# Patient Record
Sex: Male | Born: 1960 | Race: White | Hispanic: No | Marital: Married | Smoking: Former smoker
Health system: Southern US, Community
[De-identification: ages and names within clinical notes are randomized; demographics above are authoritative.]

## PROBLEM LIST (undated history)

## (undated) DIAGNOSIS — R55 Syncope and collapse: Secondary | ICD-10-CM

## (undated) DIAGNOSIS — I4892 Unspecified atrial flutter: Secondary | ICD-10-CM

## (undated) DIAGNOSIS — E785 Hyperlipidemia, unspecified: Secondary | ICD-10-CM

## (undated) DIAGNOSIS — I358 Other nonrheumatic aortic valve disorders: Secondary | ICD-10-CM

## (undated) DIAGNOSIS — G510 Bell's palsy: Secondary | ICD-10-CM

## (undated) DIAGNOSIS — E119 Type 2 diabetes mellitus without complications: Secondary | ICD-10-CM

## (undated) DIAGNOSIS — I471 Supraventricular tachycardia, unspecified: Secondary | ICD-10-CM

## (undated) DIAGNOSIS — I480 Paroxysmal atrial fibrillation: Secondary | ICD-10-CM

## (undated) DIAGNOSIS — I10 Essential (primary) hypertension: Secondary | ICD-10-CM

## (undated) DIAGNOSIS — I2510 Atherosclerotic heart disease of native coronary artery without angina pectoris: Secondary | ICD-10-CM

## (undated) HISTORY — DX: Hyperlipidemia, unspecified: E78.5

## (undated) HISTORY — DX: Other nonrheumatic aortic valve disorders: I35.8

## (undated) HISTORY — PX: HERNIA REPAIR: SHX51

## (undated) HISTORY — DX: Paroxysmal atrial fibrillation: I48.0

## (undated) HISTORY — DX: Supraventricular tachycardia, unspecified: I47.10

## (undated) HISTORY — DX: Essential (primary) hypertension: I10

## (undated) HISTORY — DX: Type 2 diabetes mellitus without complications: E11.9

## (undated) HISTORY — DX: Bell's palsy: G51.0

---

## 2009-12-13 ENCOUNTER — Ambulatory Visit: Payer: Self-pay | Admitting: General Practice

## 2012-08-26 ENCOUNTER — Ambulatory Visit: Payer: Self-pay | Admitting: Anesthesiology

## 2012-08-27 ENCOUNTER — Ambulatory Visit: Payer: Self-pay

## 2013-08-29 ENCOUNTER — Ambulatory Visit: Payer: Self-pay | Admitting: Gastroenterology

## 2013-09-01 LAB — PATHOLOGY REPORT

## 2014-11-23 ENCOUNTER — Inpatient Hospital Stay: Payer: Self-pay | Admitting: Internal Medicine

## 2014-12-09 DIAGNOSIS — G510 Bell's palsy: Secondary | ICD-10-CM | POA: Insufficient documentation

## 2015-01-10 NOTE — Discharge Summary (Signed)
PATIENT NAME:  Dustin Fletcher, Jamil C MR#:  [id number] DATE OF BIRTH:  Aug 29, 1961  DATE OF ADMISSION:  11/23/2014 DATE OF DISCHARGE:  11/24/2014  DISCHARGING PHYSICIAN:  Enid Baasadhika Lucus Lambertson, MD  CONSULTATIONS IN THE HOSPITAL:  Neurology consultation by Dr. Mellody DrownMatthew Smith.    [facility]   DISCHARGE DIAGNOSES:  1. Bell's palsy with right facial deficit.  2. Hypertension.  3. Diabetes.  4. Hyperlipidemia.  5. Obesity.   DISCHARGE MEDICATIONS.  1. Metformin 1,000 milligrams p.o. b.i.d.  2. Vitamin D3, 2000 units international units 2 capsules p.o. b.i.d.  3. [name] 10 mg p.o. daily.  4. Fish oil 1 gm capsule p.o. in the evening.  5. Fish oil 2 gm capsule p.o. in the morning.  6. Lisinopril 10 mg p.o. daily.  7. Meloxicam 15 mg p.o. at bedtime.  8. Topical metronidazole cream 1% for his once a day in the evening.  9. Aleve 220 mg 1 tablet q. 8 . p.r.n. for pain.  10. Lovastatin 20 mg p.o. daily.  11. Prednisone 60 mg for 4 more days and then 40 mg for 2 days and then 20 mg for 2 more days and stop. 12. Aspirin 81 mg p.o. daily.  13. Valacyclovir 1 gm q. 8 hours for 7 days.   DISCHARGE DIET: Low sodium carbohydrate  controlled diet.   DISCHARGE ACTIVITY: As tolerated.   FOLLOWUP INSTRUCTIONS:  PCP follow-up in one week.   LABORATORY DATA AND IMAGING STUDIES PRIOR TO DISCHARGE:  1. WBC 6.0, hemoglobin 13.4, hematocrit 41.9, platelet count 178,000.  2. Sodium 138, potassium 3.7, chloride 104, bicarbonate 26, BUN 17, creatinine 0.92, glucose 131, and calcium of 8.8.  HbA1c slightly elevated at 6.6.  3. LDL cholesterol 29, HDL 28, triglycerides 320 cholesterol of 121.  4. Ultrasound Dopplers carotids showing no evidence of any hemodynamically significant stenosis.   Echo Doppler done showing EF is 60-65%. No other abnormality noted. MRI of the brain done without contrast showing unremarkable MRI, no acute or focal intracranial abnormalities noted.    5. Urinalysis negative for any infection.   BRIEF HOSPITAL COURSE: Dustin Fletcher is a very pleasant 54 year old obese Caucasian male with past medical history significant for hypertension, diabetes, who presents to the hospital secondary to left facial numbness, right facial droop, and left arm numbness.   1. Neurological deficits completely resolved, left facial numbness and left arm numbness, not sure what the cause is exactly but the right facial droop looks like lower motor neuron kind of 7th nerve palsy which goes with Bell's palsy.  He was started  on prednisone, and valacyclovir.  His MRI is negative for any acute infarct. His carotid Dopplers are normal and his echocardiogram is normal as well.   He will be  seen by a neurology and if everything is good, he will be discharged home. He had been under a lot of stress lately, but denies any viral infection.  2. Hypertension.  He is on lisinopril for control of blood pressure.  3. Diabetes.   Continue his home medications.  4. Hyperlipidemia.   His Crestor dose has been increased because LDL was high.  He is already on fish oil for his triglycerides.  Recommended outpatient follow-up.   His course has been otherwise uneventful in the hospital.   DISCHARGE CONDITION:  Stable. He was evaluated with physical therapy, speech therapy, and he did fine. Marland Kitchen.  DISCHARGE DISPOSITION:   Home.   TIME SPENT ON DISCHARGE: Forty minutes.  cc a copy of this to [facility]    ____________________________ Enid Baas, MD rk:tr D: 11/24/2014 15:33:46 ET T: 11/24/2014 16:58:25 ET JOB#: [id number]  cc: Enid Baas, MD, <Dictator> [facility] Enid Baas MD ELECTRONICALLY SIGNED 11/26/2014 18:33

## 2015-01-10 NOTE — H&P (Signed)
PATIENT NAME:  Dustin Fletcher, Dustin Fletcher MR#:  [id number] DATE OF BIRTH:  03-Aug-1961  DATE OF ADMISSION:  11/23/2014  PRIMARY CARE PHYSICIAN:  Dr. Dossie Arbour at [facility].   REFERRING EMERGENCY ROOM PHYSICIAN:  Dr. Toney Rakes.    CHIEF COMPLAINT: Slurred speech, right-sided facial droop, and left arm numbness.   HISTORY OF PRESENT ILLNESS: This very pleasant 54 year old man with past medical history of hypertension and diabetes presents with slurred speech, right-sided facial droop, left-sided facial numbness, and left arm numbness which started after waking up this morning. He reports that he initially noticed the difference in his face when he was brushing his teeth and then again when he was trying to eat breakfast. He then went to work and noted that his arm was feeling very numb and tingly while working, not weak, but the arm also looked slightly puffy and red. He did go to the wellness center at his office, had his blood pressure check, it was 138/77. 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 He was sent to the Emergency Room due to facial asymmetry.   PAST MEDICAL HISTORY:  1. Hypertension.  2. Diabetes mellitus.  3. Obesity.   SOCIAL HISTORY: The patient lives with his wife. He does not smoke cigarettes or drink alcohol. No illicit substance use. He is very active at work, very mobile, no assistive devices.   FAMILY MEDICAL HISTORY: Positive for coronary artery disease, hypertension, congestive heart failure, atrial fibrillation in his father and brother. No history of stroke.   REVIEW OF SYSTEMS:  CONSTITUTIONAL: Negative for fatigue or fever. No change in weight.  HEENT: No change in hearing or vision. He does have some pain in the right eye with tearing.  No pain in the ears. No sore throat or difficulty swallowing.  RESPIRATORY: No cough, wheezing, shortness of  breath. No history of COPD. CARDIOVASCULAR: No chest pain edema, arrhythmias, or syncope.  GASTROINTESTINAL: No nausea, vomiting, diarrhea, abdominal pain, or change in bowel habits.  GENITOURINARY: No dysuria or frequency.  ENDOCRINE: No hot or cold intolerance. No polyuria or polydipsia.  HEMATOLOGIC: No easy bruising or bleeding.  SKIN: No new rashes or lesions.  MUSCULOSKELETAL: No new pain in the neck, back, knees, shoulders, or hips. He has not had any weakness. No gout.  NEUROLOGIC: Positive for focal numbness and weakness as above, some dysarthria, probable CVA today.  No history of CVA in the past. No seizures, dementia, memory loss, or confusion.  PSYCHIATRIC: No bipolar disorder or schizophrenia.   ALLERGIES: No known allergies.   MEDICATIONS:  1. Vitamin D3, 2000 international units 2 capsules twice a day.  2. Metronidazole topical 1% cream, apply to affected area once a day in the evening.  3. Metformin 500 mg 2 tablets twice a day.  4. Meloxicam 15 mg 1 tablet once a day at bedtime.  5. Lisinopril 10 mg 1 tablet once a day.  6. Fish oil 1000 mg 2 capsules once a day in morning and 1 in the evening.  7. [name] 10 mg oral tablet once a day in the morning.  8. Crestor 5 mg 1 tablet once a day.  9. Aleve 220 mg 2-3 tablets every 8 hours as needed for pain.     PHYSICAL EXAMINATION:  VITAL SIGNS: Temperature 98.3, pulse 78, respirations 18, blood pressure 155/99, oxygenation 97% on room air.  GENERAL: In  no acute distress.  HEENT: Pupils are equal, round, and reactive to light. Extraocular motion is intact. Conjunctivae are clear. He does have less blinking on the right side and his right eye is lacrimating. Oral mucous membranes are pink and moist. Good dentition. Posterior oropharynx is difficult to visualize, but is clear of exudate, edema, or erythema. No cervical lymphadenopathy noted. Trachea midline. Unable to palpate the thyroid.  RESPIRATORY: Lungs clear to auscultation  bilaterally with good air movement.  CARDIOVASCULAR: Distant, regular. No murmurs, rubs, or gallops. No peripheral edema. Peripheral pulses 2 +.  ABDOMEN: Obese, soft, nontender, nondistended. No guarding, no rebound. Bowel sounds are normal. No hepatosplenomegaly or mass noted.  MUSCULOSKELETAL: No swollen tender joints, range of motion normal, strength 5 out of 5 throughout in all extremities.  SKIN: No new rashes. No open lesions or wounds.  NEUROLOGIC: He has a right-sided facial droop with sensation of left-sided numbness over the jaw.  Tongue deviates to the left. Strength is 5 out of 5 in all extremities. Sensation over the extremities is intact to soft touch.  PSYCHIATRIC: He is alert and oriented and has good insight into his clinical condition. No signs of uncontrolled depression or anxiety.   LABORATORY DATA: Sodium 140, potassium 4.0, chloride 106, bicarbonate 25, BUN 17, creatinine 0.95, glucose 113. Calcium is 9.3, total protein and LFTs are normal. Troponin is less than 0.03. White blood cells 6.9, hemoglobin 14.4, platelets 180,000. MCV is 85. INR is 0.9.  IMAGING: CT of the head without contrast shows no evidence of acute intracranial abnormality.   Chest x-ray shows no active cardiopulmonary disease.   ASSESSMENT AND PLAN:  1.  Probable cerebrovascular accident: CT is negative, an MRI is pending. He has a persistent right-sided facial droop with left jaw numbness and left arm numbness which is improving. He has received 325 mg of aspirin and I will continue him on 81 mg of aspirin daily. I will increase the dose of his statin at this time. I have consulted speech and physical therapy. We will check lipids and hemoglobin A1c. He is being admitted to telemetry. We will obtain a 2-D echocardiogram and carotid Dopplers. I have consulted neurology for further recommendations. He was not on any anticoagulation or antiplatelet agents prior to this admission.  2.  Diabetes mellitus type 2.  We will check a hemoglobin A1c, hold his oral hypoglycemic agents, and start sliding scale insulin during the admission.  3.  Hypertension: We will allow for permissive hypertension in the setting of acute stroke. We will hold his lisinopril for now.  4.  Deep vein thrombosis prophylaxis with heparin.  5.  The patient is a full code.   TIME SPENT ON ADMISSION: 45 minutes.     ____________________________ Ena Dawleyatherine P. Clent RidgesWalsh, MD cpw:bu D: 11/23/2014 17:17:18 ET T: 11/23/2014 17:55:02 ET JOB#: [id number]  cc: Santina Evansatherine P. Clent RidgesWalsh, MD, <Dictator> Steele SizerMark A. [facility], MD Gale JourneyATHERINE P WALSH MD ELECTRONICALLY SIGNED 11/24/2014 15:05

## 2015-01-10 NOTE — Discharge Summary (Signed)
PATIENT NAME:  Dustin Fletcher, Dustin Fletcher MR#:  [id number] DATE OF BIRTH:  December 08, 1960  DATE OF ADMISSION:  11/23/2014 DATE OF DISCHARGE:  11/25/2014  ADMITTING PHYSICIAN: Dustin Chapel P. Volanda Napoleon, Fletcher    DISCHARGING PHYSICIAN: Dustin Lighter, Fletcher   PRIMARY CARE PHYSICIAN: Dustin Maple, Fletcher   [place]:  Fletcher consultation by Dustin Fletcher.   DISCHARGE DIAGNOSES: 1. 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  2. Hypertension.  3. Diabetes.  4. Hyperlipidemia,  5. Nonspecific left-sided symptoms, either MRI-negative stroke versus multiple sclerosis; however, no diagnosis made.   DISCHARGE MEDICATIONS: 1. Metformin 500 mg 2 tablets p.o. b.i.d.  2. Vitamin D3, 2000 international units 2 capsules p.o. b.i.d.  3. [name] 10 mg p.o. daily.  4. Fish oil 1 gram capsule daily.  5. Lisinopril 10 mg p.o. at bedtime.  6. Meloxicam 15 mg p.o. at bedtime.  7. Metronidazole 1% topical cream to affected area once a day in the evening.  8. Aleve 220 mg 2-3 tablets every 8 hours as needed for pain.  9. Fish 1 gram 2 capsules in the morning.  10. Crestor 20 mg p.o. daily.  11. Aspirin 81 mg p.o. daily.  12. Prednisone 60 mg for 5 days, then followed by 10 mg taper every day.  13. Valacyclovir 1 gram 3 times a day for 8 more days.  14. Vitamin B12, 1000 mcg p.o. daily.   DISCHARGE DIET: Low-sodium and ADA 1800-calorie diet.   DISCHARGE ACTIVITY: As tolerated.  FOLLOWUP INSTRUCTIONS: 1. PCP follow-up in 1 week.  2. Follow up with Dustin Fletcher Fletcher in 4 weeks.   LABORATORY DATA AND IMAGING STUDIES: PRIOR TO DISCHARGE: MRI of the brain with and without contrast showing normal appearance of the brain, focal enhancement of 7th nerve on the right side within internal auditory canal, consistent with neuritis. 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 The potential for neurocompression,  but foraminal stenosis is more pronounced on the right side than left. There is no acute soft disk herniation noted. Vitamin B12 levels were low normal at 336 pg per mL. Folic acid within normal limits. TSH within normal limits. ESR and CRP are within normal limits, as well. Ultrasound carotid Dopplers: Bilaterally, showing no evidence of any hemodynamically significant stenosis. WBC 6.0, hemoglobin 13.5, hematocrit 41.9, platelet count 178,000. Sodium 138, potassium 3.7, chloride 104, bicarbonate 26, BUN 17, creatinine 0.92, glucose 131, and calcium of 8.8. LDL cholesterol 29, HDL 28, triglycerides 320 cholesterol 121. Echocardiogram Doppler showing normal LV ejection fraction, EF of 60% to 65%. Urinalysis: Negative for any infection.   BRIEF Fletcher COURSE: For more details, look at the discharge summary dictated by Dustin Fletcher on 11/24/2014. In brief, Dustin Fletcher is a 54 year old obese male with past medical history significant for diabetes, hypertension, admitted to the Fletcher secondary to right facial palsy and left facial and arm numbness.  1. Bell palsy on the right side and MRI of the brain is completely negative for any stroke. He did have right facial nerve neuritis noted on the MRI, so likely it is Bell palsy. He started on valacyclovir and prednisone at this time. He was seen by neurologist, Dustin Fletcher, in the Fletcher 2. He did have nonspecific left-sided facial numbness and also arm numbness, which are not explainable. The MRI did not show any significant stroke findings. If he has recurrence of these symptoms. He needs to come  back to the Emergency Room , and this was explained to the patient, because this could be MRI negative stroke; however, his symptoms are resolved, he will still need to be on aspirin, and he was advised to increase the dose of his statin because of his elevated cholesterol levels. His ultrasound did not show any significant stenosis. He worked with physical therapy, and  he is doing fine and will follow up with Dustin Fletcher as an outpatient. MRI of the cervical spine was done. No evidence of any multiple sclerosis. Plan for lumbar puncture was there; however, discontinued because the patient refused and inflammatory markers were not elevated anyway; however, he does have some cervical spine disk disease and outpatient follow-up recommended.  3. Hypertension. The patient's home medications are being continued.  4. Diabetes. Home medications are being continued.  5. Vitamin B12 levels were low normal, so being replaced, especially with his neurological symptoms.  6. Hyperlipidemia. Statin dose has been increased.  7. His course has been otherwise uneventful in the Fletcher.   DISCHARGE CONDITION: Stable.   DISCHARGE DISPOSITION: Home.   TIME SPENT ON DISCHARGE: 45 minutes.     ____________________________ Dustin Lighter, Fletcher Dustin Fletcher D: 11/25/2014 16:11:20 ET T: 11/25/2014 19:53:06 ET JOB#: [id number]  cc: Dustin Lighter, Fletcher, <Dictator> Dustin Maple, Fletcher Dustin K. Dustin Ghazi, Fletcher Dustin Fletcher ELECTRONICALLY SIGNED 11/26/2014 18:37

## 2015-01-10 NOTE — Consult Note (Signed)
Referring Physician:  Aldean Jewett :   Primary Care Physician:  Aldean Jewett : [facility], [address], [place], [place] [address], [place] [phone]  Reason for Consult: Admit Date: 23-Nov-2014  Chief Complaint: L sided numbness  Reason for Consult: CVA   History of Present Illness: History of Present Illness:   seen at request of Dr. Volanda Napoleon for possible stroke;  54 yo RHD M presents to [facility] secondary to L facial numbness and L arm numbness initially that started 2 days ago when he woke up.  He does not note any new weakness but does states that he gets occasional numbness when he carries his bag at work but that is usually in his L arm.  He did notice some tearing in the R eye also 2 days ago.  Yesterday, he noticed the R facial droop that started.  He denies headache or neck pain.  ROS:  General denies complaints   HEENT no complaints   Lungs no complaints   Cardiac no complaints   GI no complaints   GU no complaints   Musculoskeletal no complaints   Extremities no complaints   Skin no complaints   Neuro numbness/tingling  R facial droop, slurred speech   Endocrine no complaints   Psych no complaints   Past Medical/Surgical Hx:  Hypertension:   Diabetes:   Past Medical/ Surgical Hx:  Past Medical History HTN, DM, HLD   Past Surgical History none   Home Medications: Medication Instructions Last Modified Date/Time  predniSONE 20 mg oral tablet 3 tab(s) orally once a dayx  4 days 2 tabs orally once a day x 2 days and then 1 tab orally once a day x 2 days 15-Mar-16 09:44  rosuvastatin 20 mg oral tablet 1 tab(s) orally once a day 15-Mar-16 09:44  aspirin 81 mg oral tablet, chewable 1 tab(s) orally once a day 15-Mar-16 09:44  valACYclovir 1 g oral tablet 1 tab(s) orally every 8 hours x 7 days 15-Mar-16 09:44  metFORMIN 500 mg oral tablet 2 tab(s) orally 2 times a day 15-Mar-16 07:33  Vitamin D3 2000 intl units oral capsule 2 cap(s)  orally 2 times a day 15-Mar-16 07:33  [name] 10 mg oral tablet 1 tab(s) orally once a day (in the morning) 15-Mar-16 07:33  Fish Oil 1000 mg oral capsule 2 cap(s) orally once a day (in the morning) 15-Mar-16 07:33  Fish Oil 1000 mg oral capsule 1 cap(s) orally once a day (in the evening) 15-Mar-16 07:33  lisinopril 10 mg oral tablet 1 tab(s) orally once a day (at bedtime) 15-Mar-16 07:33  meloxicam 15 mg oral tablet 1 tab(s) orally once a day (at bedtime) 15-Mar-16 07:33  metroNIDAZOLE topical 1% topical cream Apply topically to affected area once a day (in the evening) 15-Mar-16 07:33  Aleve sodium 220 mg oral tablet 2-3 tab(s) orally every 8 hours, As Needed - for Pain 15-Mar-16 07:33   Allergies:  No Known Allergies:   Allergies:  Allergies NKDA   Social/Family History: Employment Status: currently employed  Lives With: spouse  Living Arrangements: house  Social History: + tob, no EtOH, no illicits  Family History: no seizures, no stroke   Vital Signs: **Vital Signs.:   15-Mar-16 11:38  Vital Signs Type Routine  Temperature Temperature (F) 97.8  Celsius 36.5  Temperature Source oral  Pulse Pulse 76  Systolic BP Systolic BP 553  Diastolic BP (mmHg) Diastolic BP (mmHg) 83  Mean BP 99  Pulse Ox % Pulse  Ox % 97  Pulse Ox Activity Level  At rest  Oxygen Delivery Room Air/ 21 %   Physical Exam: General: overweight, NAD  HEENT: normocephalic, sclera nonicteric, oropharynx clear, rhinophymia  Neck: supple, no JVD, no bruits  Chest: CTA B, no wheezing  Cardiac: RRR, no murmurs, no edema, 2+ pulses  Extremities: no C/C/E, FROM   Neurologic Exam: Mental Status: alert and oriented x 3, normal speech and language, follows complex commands  Cranial Nerves: PERRLA, EOMI, nl VF, R CN VII palsy, tongue midline, shoulder shrug equal  Motor Exam: mild drift in L UE and L LE, 5 /5 R, nl tone  Deep Tendon Reflexes: 3+ on L, 2+ on R, downgoing plantars B, neg Hoffmann  Sensory Exam:  pinprick, temperature, and vibration intact B  Coordination: FTN and HTS WNL   Lab Results: LabObservation:  14-Mar-16 18:55   OBSERVATION Reason for Test  Hepatic:  14-Mar-16 15:36   Bilirubin, Total 0.7 (0.3-1.2 NOTE: New Reference Range  11/03/14)  Alkaline Phosphatase 51 (38-126 NOTE: New Reference Range  11/03/14)  SGPT (ALT) 20 (17-63 NOTE: New Reference Range  11/03/14)  SGOT (AST) 19 (15-41 NOTE: New Reference Range  11/03/14)  Total Protein, Serum 6.9 (6.5-8.1 NOTE: New Reference Range  11/03/14)  Albumin, Serum 4.1 (3.5-5.0 NOTE: New reference range  11/03/14)  Routine Chem:  15-Mar-16 03:01   Cholesterol, Serum 121 (0-200 NOTE: New Reference Range  11/03/14)  Triglycerides, Serum  320 (0-149 NOTE: New Reference Range  11/03/14)  HDL (INHOUSE)  28 (41-135 NOTE: New Reference Range:  11/03/14)  VLDL Cholesterol Calculated  64 (0-40 NOTE: New Reference Range  11/03/14)  LDL Cholesterol Calculated 29 (0-99 NOTE: New Reference Range  11/03/14)  Glucose, Serum  131 (65-99 NOTE: New Reference Range  11/03/14)  BUN 17 (6-20 NOTE: New Reference Range  11/03/14)  Creatinine (comp) 0.92 (0.61-1.24 NOTE: New Reference Range  11/03/14)  Sodium, Serum 138 (135-145 NOTE: New Reference Range  11/03/14)  Potassium, Serum 3.7 (3.5-5.1 NOTE: New Reference Range  11/03/14)  Chloride, Serum 104 (101-111 NOTE: New Reference Range  11/03/14)  CO2, Serum 26 (22-32 NOTE: New Reference Range  11/03/14)  Calcium (Total), Serum  8.8 (8.9-10.3 NOTE: New Reference Range  11/03/14)  Anion Gap 8  eGFR (African American) >60  eGFR (Non-African American) >60 (eGFR values <26m/min/1.73 m2 may be an indication of chronic kidney disease (CKD). Calculated eGFR is useful in patients with stable renal function. The eGFR calculation will not be reliable in acutely ill patients when serum creatinine is changing rapidly. It is not useful in patients on dialysis. The eGFR  calculation may not be applicable to patients at the low and high extremes of body sizes, pregnant women, and vegetarians.)  Hemoglobin A1c ([facility])  6.6 (4.0-6.0 NOTE: New Reference Range  11/03/14)  Cardiac:  14-Mar-16 15:36   Troponin I <0.03 (0.00-0.03 0.03 ng/mL or less: NEGATIVE  Repeat testing in 3-6 hrs  if clinically indicated. >0.03 ng/mL: POTENTIAL  MYOCARDIAL INJURY. Repeat  testing in 3-6 hrs if  clinically indicated. NOTE: An increase or decrease  of 30% or more on serial  testing suggests a  clinically important change NOTE: New Reference Range  11/03/14)  Routine UA:  14-Mar-16 15:36   Color (UA) Yellow  Clarity (UA) Clear  Glucose (UA) >=500  Bilirubin (UA) Negative  Ketones (UA) Negative  Specific Gravity (UA) 1.016  Blood (UA) Negative  pH (UA) 5.0  Protein (UA) Negative  Nitrite (UA) Negative  Leukocyte Esterase (UA) Negative (Result(s) reported on 23 Nov 2014 at 06:24PM.)  RBC (UA) NONE SEEN  WBC (UA) <1 /HPF  Bacteria (UA) NONE SEEN  Epithelial Cells (UA) NONE SEEN  Mucous (UA) PRESENT (Result(s) reported on 23 Nov 2014 at [facility].)  Routine Coag:  14-Mar-16 15:36   Activated PTT (APTT) 26.3 (A HCT value >55% may artifactually increase the APTT. In one study, the increase was an average of 19%. Reference: "Effect on Routine and Special Coagulation Testing Values of Citrate Anticoagulant Adjustment in Patients with High HCT Values." American Journal of Clinical Pathology 2376;283:151-761.)  Prothrombin 12.4 (11.4-15.0 NOTE: New Reference Range  10/09/14)  INR 0.9 (INR reference interval applies to patients on anticoagulant therapy. A single INR therapeutic range for coumarins is not optimal for all indications; however, the suggested range for most indications is 2.0 - 3.0. Exceptions to the INR Reference Range may include: Prosthetic heart valves, acute myocardial infarction, prevention of myocardial infarction, and combinations of aspirin  and anticoagulant. The need for a higher or lower target INR must be assessed individually. Reference: The Pharmacology and Management of the Vitamin K  antagonists: the seventh ACCP Conference on Antithrombotic and Thrombolytic Therapy. YWVPX.1062 Sept:126 (3suppl): [id number]. A HCT value >55% may artifactually increase the PT.  In one study,  the increase was an average of 25%. Reference:  "Effect on Routine and Special Coagulation Testing Values of Citrate Anticoagulant Adjustment in Patients with High HCT Values." American Journal of Clinical Pathology 2006;126:400-405.)  Routine Hem:  15-Mar-16 03:01   WBC (CBC) 6.0  RBC (CBC) 4.90  Hemoglobin (CBC) 13.5  Hematocrit (CBC) 41.9  Platelet Count (CBC) 178  MCV 86  MCH 27.5  MCHC 32.1  RDW  14.7  Neutrophil % 54.6  Lymphocyte % 31.9  Monocyte % 10.8  Eosinophil % 2.3  Basophil % 0.4  Neutrophil # 3.3  Lymphocyte # 1.9  Monocyte # 0.7  Eosinophil # 0.1  Basophil # 0.0 (Result(s) reported on 24 Nov 2014 at 04:07AM.)   Radiology Results: [place]:    15-Mar-16 10:58, US Carotid Doppler Bilateral  US Carotid Doppler Bilateral   REASON FOR EXAM:    cva  COMMENTS:       PROCEDURE: [place]  - US CAROTID DOPPLER BILATERAL  - Nov 24 2014 10:58AM     CLINICAL DATA:  54 year old male with transient ischemic attack  symptoms    EXAM:  BILATERAL CAROTID DUPLEX ULTRASOUND    TECHNIQUE:  Pearline Cables scale imaging, color Doppler and duplex ultrasound were  performed of bilateral carotid and vertebral arteries in the neck.  COMPARISON:  Brain MRI 11/23/2014    FINDINGS:  Criteria: Quantification of carotid stenosis is based on velocity  parameters that correlate the residual internal carotid diameter  with NASCET-based stenosis levels, using the diameter of the distal  internal carotid lumen as the denominator for stenosis measurement.    The following velocity measurements were obtained:    RIGHT    ICA:  86/28 cm/sec    CCA:  694/85  cm/sec  SYSTOLIC ICA/CCA RATIO:  0.8    DIASTOLIC ICA/CCA RATIO:  1.1    ECA:  100 cm/sec    LEFT    ICA:  62/31 cm/sec    CCA:  46/27 cm/sec    SYSTOLIC ICA/CCA RATIO:  0.9    DIASTOLIC ICA/CCA RATIO:  1.6  ECA:  72 cm/sec    RIGHT CAROTID ARTERY: No significant atherosclerotic plaque or  evidence of stenosis.  RIGHT VERTEBRAL ARTERY:  Patent with antegrade flow.    LEFT CAROTID ARTERY: No significant atherosclerotic plaque or  evidence of stenosis.    LEFT VERTEBRAL ARTERY:  Patent with antegrade flow.     IMPRESSION:  Unremarkable bilateral carotid duplex ultrasound.  Vertebral arteries are patent with antegrade flow.    Signed,    Criselda Peaches, MD    [facility]    [facility]      Electronically Signed    By: Jacqulynn Cadet M.D.    On: 11/24/2014 11:08     Verified By: Criselda Peaches, M.D.,  MRI:    14-Mar-16 18:45, MRI Brain Without Contrast  MRI Brain Without Contrast   REASON FOR EXAM:    CVA  COMMENTS:       PROCEDURE: MR  - MR BRAIN WO CONTRAST  - Nov 23 2014  6:45PM     CLINICAL DATA:  Slurred speech began around 12;30 today, with  LEFT-sided facial numbness extending to the LEFT arm. Symptoms have  partially resolved. Initial encounter.    EXAM:  MRI HEAD WITHOUT CONTRAST    TECHNIQUE:  Multiplanar, multiecho pulse sequences of the brain and surrounding  structures were obtained without intravenous contrast.  COMPARISON:  CT head earlier today.    FINDINGS:  No evidence for acute infarction, hemorrhage, mass lesion,  hydrocephalus, or extra-axial fluid. Normal cerebral volume. No  significant white matter disease. Flow voids are maintained  throughout the carotid, basilar, and vertebral arteries. There are  no areas of chronic hemorrhage. Pituitary, pineal, and cerebellar  tonsils unremarkable. No upper cervical lesions. Visualized  calvarium, skull base, and upper  cervical osseous structures  unremarkable. Scalp and extracranial soft tissues, orbits, sinuses,  and mastoids show no acute process. Similar appearance to prior CTA     IMPRESSION:  Unremarkable MRI head. No acute or focal intracranial abnormalities.  Electronically Signed    By: Rolla Flatten M.D.    On: 11/23/2014 18:49         Verified By: Staci Righter, M.D.,  CT:    14-Mar-16 14:57, CT Head Without Contrast  CT Head Without Contrast   REASON FOR EXAM:    possible CVA  COMMENTS:   May transport without cardiac monitor    PROCEDURE: CT  - CT HEAD WITHOUT CONTRAST  - Nov 23 2014  2:57PM     CLINICAL DATA:  Numbness and tingling left arm and hand on the  initial evaluation with some slurred speech since this morning    EXAM:  CT HEAD WITHOUT CONTRAST    TECHNIQUE:  Contiguous axial images were obtained from the base of the skull  through the vertex without intravenous contrast.  COMPARISON:  None.    FINDINGS:  No evidence of abnormal attenuation to suggest mass or vascular  territory infarct. No hemorrhage or extra-axial fluid. Mild diffuse  cortical atrophy. No hydrocephalus. Calvarium intact. Visualized  portions of paranasal sinuses well-aerated.     IMPRESSION:  No evidence of acute intracranial abnormality.      Electronically Signed    By: Skipper Cliche M.D.    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 but would be highly unusual with other symptoms so we need to r/o other etiologies R hemispheric syndrome-  this could be caused by C spine problem or infectious etiology in CSF;  there are abnormal reflexes on the L as well as weakness so this is likely a central problem not seen  on MRI repeat MRI of brain and IAC w/wo contrast MRI of c-spine w/wo contrast check ESR, CRP, B12/folate, TSH and lyme may need LP tomorrow if above neg change Valacyclovir to Acyclovir IV until MRI returns ok to continue steroids will follow closely  Electronic Signatures: Jamison Neighbor (MD)  (Signed 15-Mar-16 18:19)  Authored: REFERRING PHYSICIAN, Primary Care Physician, Consult, History of Present Illness, Review of Systems, PAST MEDICAL/SURGICAL HISTORY, HOME MEDICATIONS, ALLERGIES, Social/Family History, NURSING VITAL SIGNS, Physical Exam-, LAB RESULTS, RADIOLOGY RESULTS, Recommendations   Last Updated: 15-Mar-16 18:19 by Jamison Neighbor (MD)

## 2015-05-19 ENCOUNTER — Telehealth: Payer: Self-pay | Admitting: Family Medicine

## 2015-05-19 NOTE — Telephone Encounter (Signed)
Pt called stated that if he needs labs for his CPE on 06/01/15 please fax order to [facility] @ [phone] Attn: Massie Bougie Day. Thanks.

## 2015-05-20 NOTE — Telephone Encounter (Signed)
Notified patient via VM that we are not able to send a order because Dr.Crissman is out of the office until 05/31/15.

## 2015-05-25 DIAGNOSIS — I10 Essential (primary) hypertension: Secondary | ICD-10-CM | POA: Insufficient documentation

## 2015-05-25 DIAGNOSIS — E118 Type 2 diabetes mellitus with unspecified complications: Secondary | ICD-10-CM | POA: Insufficient documentation

## 2015-05-25 DIAGNOSIS — E785 Hyperlipidemia, unspecified: Secondary | ICD-10-CM | POA: Insufficient documentation

## 2015-05-25 DIAGNOSIS — E119 Type 2 diabetes mellitus without complications: Secondary | ICD-10-CM | POA: Insufficient documentation

## 2015-06-01 ENCOUNTER — Ambulatory Visit (INDEPENDENT_AMBULATORY_CARE_PROVIDER_SITE_OTHER): Payer: BLUE CROSS/BLUE SHIELD | Admitting: Family Medicine

## 2015-06-01 ENCOUNTER — Encounter: Payer: Self-pay | Admitting: Family Medicine

## 2015-06-01 VITALS — BP 124/78 | HR 96 | Temp 97.7°F | Ht 73.7 in | Wt 288.0 lb

## 2015-06-01 DIAGNOSIS — E119 Type 2 diabetes mellitus without complications: Secondary | ICD-10-CM

## 2015-06-01 DIAGNOSIS — E785 Hyperlipidemia, unspecified: Secondary | ICD-10-CM | POA: Diagnosis not present

## 2015-06-01 DIAGNOSIS — L711 Rhinophyma: Secondary | ICD-10-CM | POA: Diagnosis not present

## 2015-06-01 DIAGNOSIS — I10 Essential (primary) hypertension: Secondary | ICD-10-CM

## 2015-06-01 MED ORDER — METRONIDAZOLE 1 % EX GEL: Freq: Every day | CUTANEOUS | Status: DC

## 2015-06-01 MED ORDER — ROSUVASTATIN CALCIUM 5 MG PO TABS: 5.0000 mg | ORAL_TABLET | Freq: Every day | ORAL | Status: DC

## 2015-06-01 MED ORDER — METFORMIN HCL 500 MG PO TABS: 1000.0000 mg | ORAL_TABLET | Freq: Two times a day (BID) | ORAL | Status: DC

## 2015-06-01 MED ORDER — MELOXICAM 15 MG PO TABS: 15.0000 mg | ORAL_TABLET | Freq: Every day | ORAL | Status: DC

## 2015-06-01 MED ORDER — DAPAGLIFLOZIN PROPANEDIOL 10 MG PO TABS: 10.0000 mg | ORAL_TABLET | Freq: Every day | ORAL | Status: DC

## 2015-06-01 MED ORDER — LISINOPRIL 10 MG PO TABS: 10.0000 mg | ORAL_TABLET | Freq: Every day | ORAL | Status: DC

## 2015-06-01 NOTE — Progress Notes (Signed)
BP 124/78 mmHg  Pulse 96  Temp(Src) 97.7 F (36.5 C)  Ht 6' 1.7" (1.872 m)  Wt 288 lb (130.636 kg)  BMI 37.28 kg/m2  SpO2 95%   Subjective:    Patient ID: Dustin Fletcher, male    DOB: 1961/01/06, 54 y.o.   MRN: [id number]  HPI: Dustin Fletcher is a 54 y.o. male  Chief Complaint  Patient presents with  . Annual Exam   about a month ago or so patient had some constipation issues which have since resolved with stool softeners. Patient doing well. Biggest concern is patient having some right eye issues with occasional throbbing but vision is okay. Patient hadn't had his annual checkup. Is gotten notification to make appointment which she will do.  Patient's blood pressures been doing well with some occasional orthostatic hypotensive changes. Blood sugar doing well no hypoglycemic spells  Relevant past medical, surgical, family and social history reviewed and updated as indicated. Interim medical history since our last visit reviewed. Allergies and medications reviewed and updated.  Review of Systems  Constitutional: Negative.   HENT: Negative.   Eyes: Negative.   Respiratory: Negative.   Cardiovascular: Negative.   Gastrointestinal: Negative.   Endocrine: Negative.   Genitourinary: Negative.   Musculoskeletal: Negative.   Skin: Negative.   Allergic/Immunologic: Negative.   Neurological: Negative.   Hematological: Negative.   Psychiatric/Behavioral: Negative.     Per HPI unless specifically indicated above     Objective:    BP 124/78 mmHg  Pulse 96  Temp(Src) 97.7 F (36.5 C)  Ht 6' 1.7" (1.872 m)  Wt 288 lb (130.636 kg)  BMI 37.28 kg/m2  SpO2 95%  Wt Readings from Last 3 Encounters:  06/01/15 288 lb (130.636 kg)  11/30/14 285 lb (129.275 kg)    Physical Exam  Constitutional: He is oriented to person, place, and time. He appears well-developed and well-nourished.  HENT:  Head: Normocephalic and atraumatic.  Right Ear: External ear normal.  Left  Ear: External ear normal.  Eyes: Conjunctivae and EOM are normal. Pupils are equal, round, and reactive to light.  Neck: Normal range of motion. Neck supple.  Cardiovascular: Normal rate, regular rhythm, normal heart sounds and intact distal pulses.   Pulmonary/Chest: Effort normal and breath sounds normal.  Abdominal: Soft. Bowel sounds are normal. There is no splenomegaly or hepatomegaly.  Genitourinary: Rectum normal, prostate normal and penis normal.  Musculoskeletal: Normal range of motion.  Neurological: He is alert and oriented to person, place, and time. He has normal reflexes.  Skin: No rash noted. No erythema.  Psychiatric: He has a normal mood and affect. His behavior is normal. Judgment and thought content normal.        Assessment & Plan:   Problem List Items Addressed This Visit      Cardiovascular and Mediastinum   Hypertension - Primary    The current medical regimen is effective;  continue present plan and medications.       Relevant Medications   rosuvastatin (CRESTOR) 5 MG tablet   lisinopril (PRINIVIL,ZESTRIL) 10 MG tablet     Endocrine   Diabetes mellitus without complication    a1c pending      Relevant Medications   rosuvastatin (CRESTOR) 5 MG tablet   metFORMIN (GLUCOPHAGE) 500 MG tablet   lisinopril (PRINIVIL,ZESTRIL) 10 MG tablet   dapagliflozin propanediol ([name]) 10 MG TABS tablet     Other   Hyperlipidemia    The current medical regimen is effective;  continue  present plan and medications.       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  Follow up plan: Return in about 3 months (around 08/31/2015), or if symptoms worsen or fail to improve, for 3 mo  A1C.

## 2015-06-01 NOTE — Assessment & Plan Note (Signed)
a1c pending.

## 2015-06-01 NOTE — Assessment & Plan Note (Signed)
The current medical regimen is effective;  continue present plan and medications.  

## 2015-06-01 NOTE — Assessment & Plan Note (Signed)
Discussed pt does not want any tx

## 2015-08-23 ENCOUNTER — Encounter: Payer: Self-pay | Admitting: Family Medicine

## 2015-08-23 ENCOUNTER — Ambulatory Visit (INDEPENDENT_AMBULATORY_CARE_PROVIDER_SITE_OTHER): Payer: BLUE CROSS/BLUE SHIELD | Admitting: Family Medicine

## 2015-08-23 VITALS — BP 108/73 | HR 101 | Temp 98.6°F | Ht 73.5 in | Wt 288.0 lb

## 2015-08-23 DIAGNOSIS — I10 Essential (primary) hypertension: Secondary | ICD-10-CM

## 2015-08-23 DIAGNOSIS — E785 Hyperlipidemia, unspecified: Secondary | ICD-10-CM | POA: Diagnosis not present

## 2015-08-23 DIAGNOSIS — E119 Type 2 diabetes mellitus without complications: Secondary | ICD-10-CM | POA: Diagnosis not present

## 2015-08-23 NOTE — Progress Notes (Signed)
   BP 108/73 mmHg  Pulse 101  Temp(Src) 98.6 F (37 C)  Ht 6' 1.5" (1.867 m)  Wt 288 lb (130.636 kg)  BMI 37.48 kg/m2  SpO2 98%   Subjective:    Patient ID: Dustin Fletcher, male    DOB: 1960/12/20, 54 y.o.   MRN: [id number]  HPI: Dustin DoyneMichael C Roulhac is a 54 y.o. male  Chief Complaint  Patient presents with  . Diabetes   patient doing well predicting his A1c will be a little higher because of the season. Blood pressure doing well no complaints from medications taking medications faithfully no low blood sugar spells no side effects from medications Cholesterol no issues Relevant past medical, surgical, family and social history reviewed and updated as indicated. Interim medical history since our last visit reviewed. Allergies and medications reviewed and updated.  Review of Systems  Constitutional: Negative.   Respiratory: Negative.   Cardiovascular: Negative.     Per HPI unless specifically indicated above     Objective:    BP 108/73 mmHg  Pulse 101  Temp(Src) 98.6 F (37 C)  Ht 6' 1.5" (1.867 m)  Wt 288 lb (130.636 kg)  BMI 37.48 kg/m2  SpO2 98%  Wt Readings from Last 3 Encounters:  08/23/15 288 lb (130.636 kg)  06/01/15 288 lb (130.636 kg)  11/30/14 285 lb (129.275 kg)    Physical Exam  Constitutional: He is oriented to person, place, and time. He appears well-developed and well-nourished. No distress.  HENT:  Head: Normocephalic and atraumatic.  Right Ear: Hearing normal.  Left Ear: Hearing normal.  Nose: Nose normal.  Eyes: Conjunctivae and lids are normal. Right eye exhibits no discharge. Left eye exhibits no discharge. No scleral icterus.  Cardiovascular: Normal rate, regular rhythm and normal heart sounds.   Pulmonary/Chest: Effort normal and breath sounds normal. No respiratory distress.  Musculoskeletal: Normal range of motion.  Neurological: He is alert and oriented to person, place, and time.  Skin: Skin is intact. No rash noted.  Psychiatric:  He has a normal mood and affect. His speech is normal and behavior is normal. Judgment and thought content normal. Cognition and memory are normal.        Assessment & Plan:   Problem List Items Addressed This Visit      Cardiovascular and Mediastinum   Hypertension    The current medical regimen is effective;  continue present plan and medications.         Endocrine   Diabetes mellitus without complication (HCC) - Primary    The current medical regimen is effective;  continue present plan and medications.         Other   Hyperlipidemia    The current medical regimen is effective;  continue present plan and medications.           Follow up plan: Return in about 3 months (around 11/21/2015) for check BMP, a1c, lipids, alt, ast.

## 2015-08-23 NOTE — Assessment & Plan Note (Signed)
The current medical regimen is effective;  continue present plan and medications.  

## 2015-11-22 ENCOUNTER — Encounter: Payer: Self-pay | Admitting: Family Medicine

## 2015-11-22 ENCOUNTER — Ambulatory Visit (INDEPENDENT_AMBULATORY_CARE_PROVIDER_SITE_OTHER): Payer: BLUE CROSS/BLUE SHIELD | Admitting: Family Medicine

## 2015-11-22 VITALS — BP 102/65 | HR 91 | Temp 98.0°F | Ht 74.0 in | Wt 284.0 lb

## 2015-11-22 DIAGNOSIS — E785 Hyperlipidemia, unspecified: Secondary | ICD-10-CM

## 2015-11-22 DIAGNOSIS — E119 Type 2 diabetes mellitus without complications: Secondary | ICD-10-CM | POA: Diagnosis not present

## 2015-11-22 DIAGNOSIS — I10 Essential (primary) hypertension: Secondary | ICD-10-CM

## 2015-11-22 NOTE — Assessment & Plan Note (Signed)
The current medical regimen is effective;  continue present plan and medications.  

## 2015-11-22 NOTE — Progress Notes (Signed)
   BP 102/65 mmHg  Pulse 91  Temp(Src) 98 F (36.7 C)  Ht 6\' 2"  (1.88 m)  Wt 284 lb (128.822 kg)  BMI 36.45 kg/m2  SpO2 95%   Subjective:    Patient ID: Dustin Fletcher, male    DOB: 12-18-1960, 55 y.o.   MRN: [id number]  HPI: Dustin DoyneMichael C Gurry is a 55 y.o. male  Chief Complaint  Patient presents with  . Diabetes  . Hyperlipidemia  . Hypertension   Patient recheck doing well with no low blood sugar spells blood pressure good control Cholesterol doing well taking medications faithfully and without problems  Relevant past medical, surgical, family and social history reviewed and updated as indicated. Interim medical history since our last visit reviewed. Allergies and medications reviewed and updated.  Review of Systems  Constitutional: Negative.   Respiratory: Negative.   Cardiovascular: Negative.     Per HPI unless specifically indicated above     Objective:    BP 102/65 mmHg  Pulse 91  Temp(Src) 98 F (36.7 C)  Ht 6\' 2"  (1.88 m)  Wt 284 lb (128.822 kg)  BMI 36.45 kg/m2  SpO2 95%  Wt Readings from Last 3 Encounters:  11/22/15 284 lb (128.822 kg)  08/23/15 288 lb (130.636 kg)  06/01/15 288 lb (130.636 kg)    Physical Exam  Constitutional: He is oriented to person, place, and time. He appears well-developed and well-nourished. No distress.  HENT:  Head: Normocephalic and atraumatic.  Right Ear: Hearing normal.  Left Ear: Hearing normal.  Nose: Nose normal.  Eyes: Conjunctivae and lids are normal. Right eye exhibits no discharge. Left eye exhibits no discharge. No scleral icterus.  Cardiovascular: Normal rate, regular rhythm and normal heart sounds.   Pulmonary/Chest: Effort normal and breath sounds normal. No respiratory distress.  Musculoskeletal: Normal range of motion.  Neurological: He is alert and oriented to person, place, and time.  Skin: Skin is intact. No rash noted.  Psychiatric: He has a normal mood and affect. His speech is normal and  behavior is normal. Judgment and thought content normal. Cognition and memory are normal.        Assessment & Plan:   Problem List Items Addressed This Visit      Cardiovascular and Mediastinum   Hypertension - Primary    The current medical regimen is effective;  continue present plan and medications.         Endocrine   Diabetes mellitus without complication (HCC)    The current medical regimen is effective;  continue present plan and medications.         Other   Hyperlipidemia    The current medical regimen is effective;  continue present plan and medications.           Follow up plan: Return in about 3 months (around 02/22/2016) for will get A1c done at work.

## 2016-01-20 ENCOUNTER — Other Ambulatory Visit: Payer: Self-pay | Admitting: Family Medicine

## 2016-02-23 LAB — HEMOGLOBIN A1C: Hemoglobin A1C: 6.9

## 2016-02-28 ENCOUNTER — Other Ambulatory Visit: Payer: Self-pay | Admitting: Family Medicine

## 2016-02-28 ENCOUNTER — Telehealth: Payer: Self-pay | Admitting: Family Medicine

## 2016-02-28 ENCOUNTER — Encounter (INDEPENDENT_AMBULATORY_CARE_PROVIDER_SITE_OTHER): Payer: BLUE CROSS/BLUE SHIELD | Admitting: Family Medicine

## 2016-02-28 DIAGNOSIS — I10 Essential (primary) hypertension: Secondary | ICD-10-CM

## 2016-02-28 NOTE — Telephone Encounter (Signed)
Pt stated he needs orders for the labs that are going to be needed for his CPE. He has labs drawn by his employer. Please call when orders are ready for pick up. Or orders can be faxed to the [facility] @   [facility] [address] [phone] (fax)   Call pt to inform when fax is sent. Thanks.

## 2016-02-28 NOTE — Progress Notes (Unsigned)
This encounter was created in error - please disregard.

## 2016-02-28 NOTE — Progress Notes (Signed)
   There were no vitals taken for this visit.   Subjective:    Patient ID: Dustin Fletcher, male    DOB: Jun 22, 1961, 55 y.o.   MRN: [id number]  HPI: Dustin Fletcher is a 55 y.o. male  No chief complaint on file.    Review of Systems  Per HPI unless specifically indicated above     Objective:    There were no vitals taken for this visit.  Wt Readings from Last 3 Encounters:  11/22/15 284 lb (128.822 kg)  08/23/15 288 lb (130.636 kg)  06/01/15 288 lb (130.636 kg)    Physical Exam        This encounter was created in error - please disregard.

## 2016-02-28 NOTE — Telephone Encounter (Signed)
Status: Signed       Expand All Collapse All   Pt stated he needs orders for the labs that are going to be needed for his CPE. He has labs drawn by his employer. Please call when orders are ready for pick up. Or orders can be faxed to the [facility] @  [facility] [phone] (fax)   Call pt to inform when fax is sent. Thanks.

## 2016-02-29 ENCOUNTER — Encounter: Payer: Self-pay | Admitting: Family Medicine

## 2016-02-29 NOTE — Telephone Encounter (Signed)
Called to inform pt fax had been sent no answer, left message on answering machine. Thanks.

## 2016-04-12 IMAGING — CR DG CHEST 1V PORT
1 series · 1 of 1 positions shown · non-contrast
Comparison: None.

CLINICAL DATA: Possible stroke. Left arm and hand numbness and
tingling with slurred speech and facial droop. History of
hypertension and diabetes. Former smoker.

EXAM:
PORTABLE CHEST - 1 VIEW

[ap]
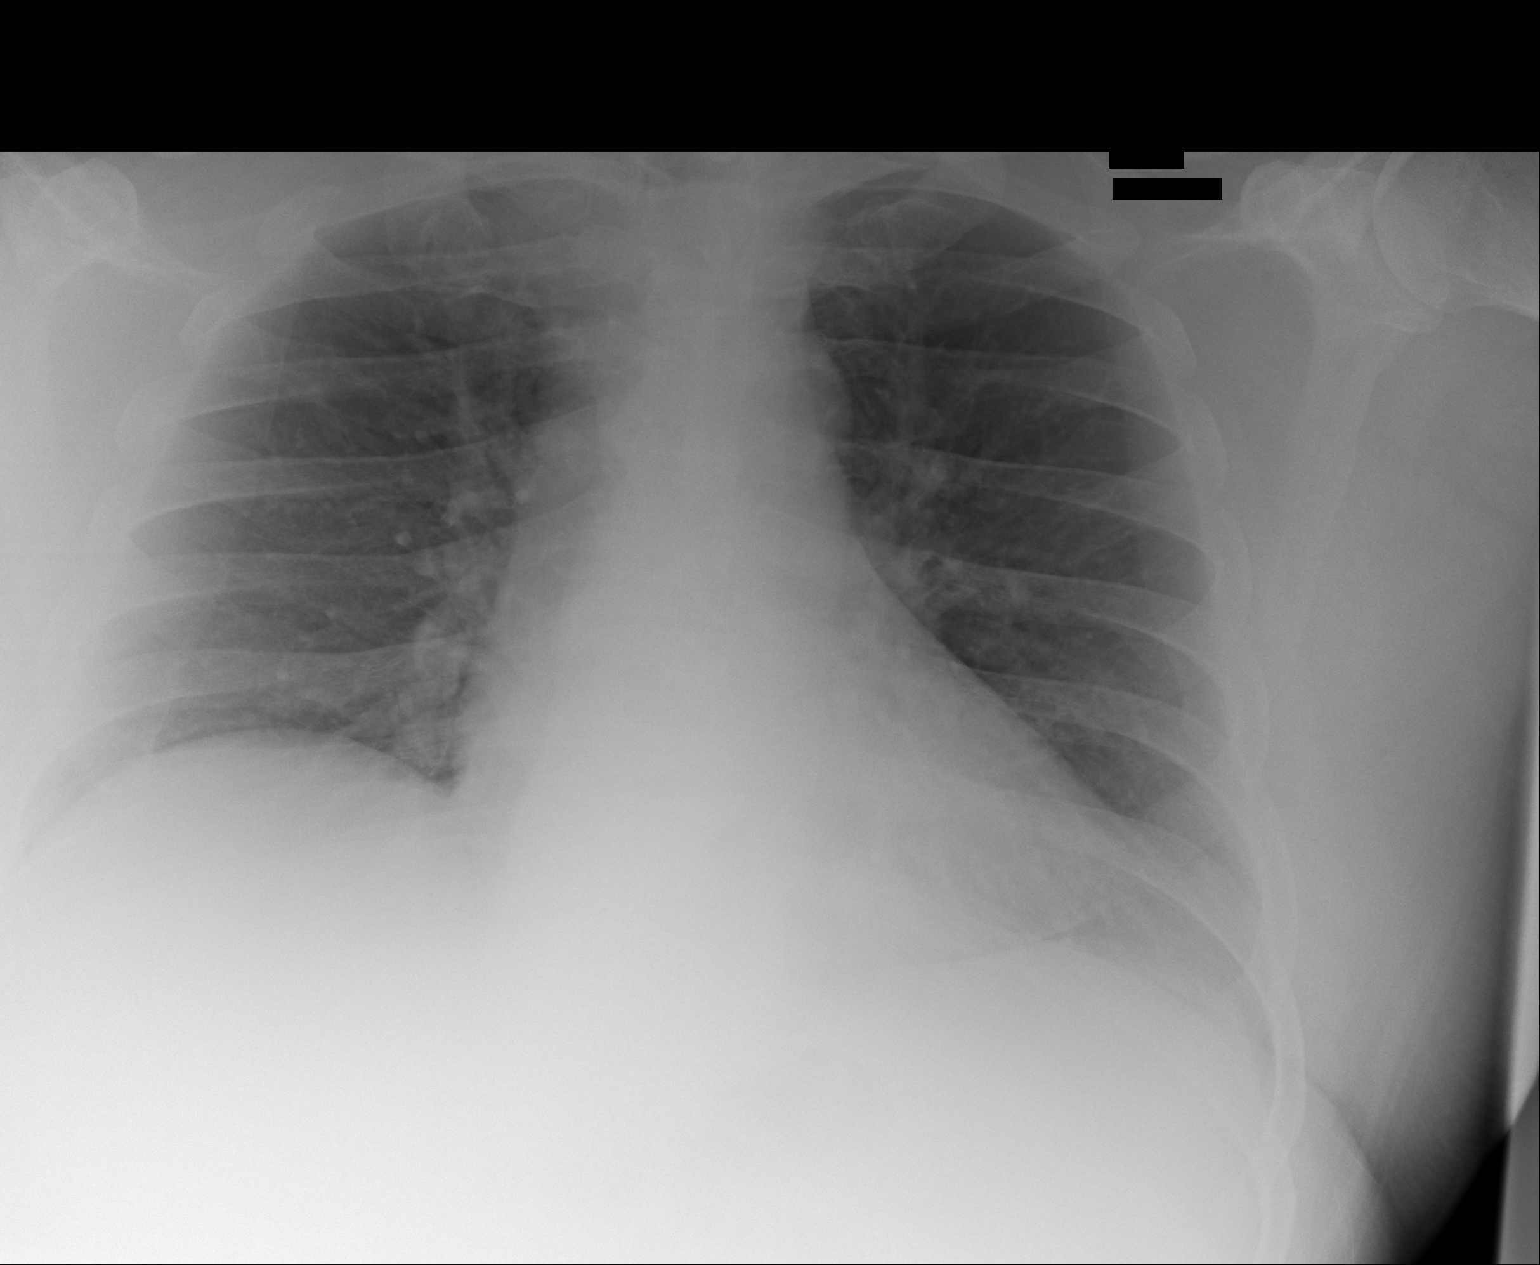

[1 of 1 positions shown; findings below may reference images not displayed]

FINDINGS: The cardiac silhouette is upper limits of normal in size. The lungs
are are mildly hypoinflated without evidence of airspace
consolidation, edema, pleural effusion, or pneumothorax. No acute
osseous abnormality is identified.
IMPRESSION: No active disease.

## 2016-04-13 IMAGING — US US CAROTID DUPLEX BILAT
1 series · 13 of 24 positions shown · non-contrast
Comparison: Brain MRI 11/23/2014

CLINICAL DATA: 53-year-old male with transient ischemic attack
symptoms

EXAM:
BILATERAL CAROTID DUPLEX ULTRASOUND
TECHNIQUE: Gray scale imaging, color Doppler and duplex ultrasound were
performed of bilateral carotid and vertebral arteries in the neck.

[Series 1: us carotid duplex bilat · 0.07mm/px · 13 of 62 slices shown]
[im 1/62]
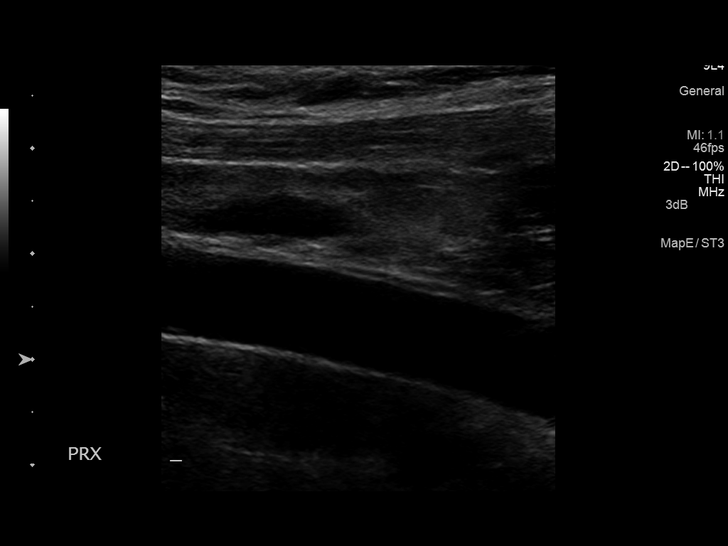
[im 6/62]
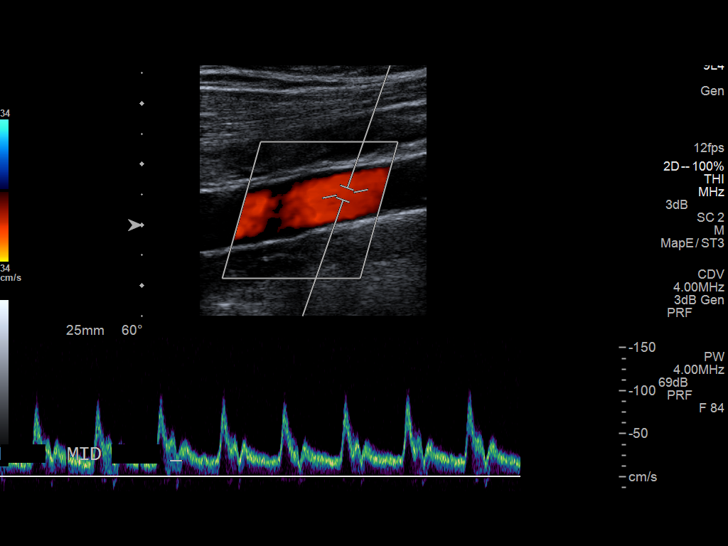
[im 11/62]
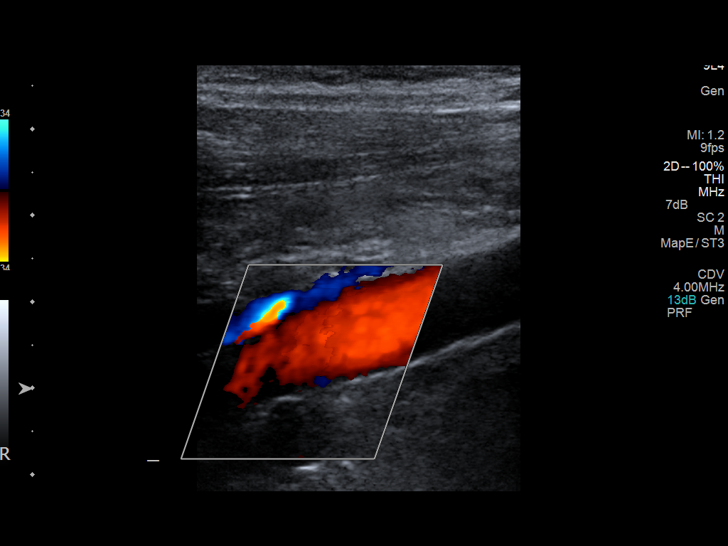
[im 16/62]
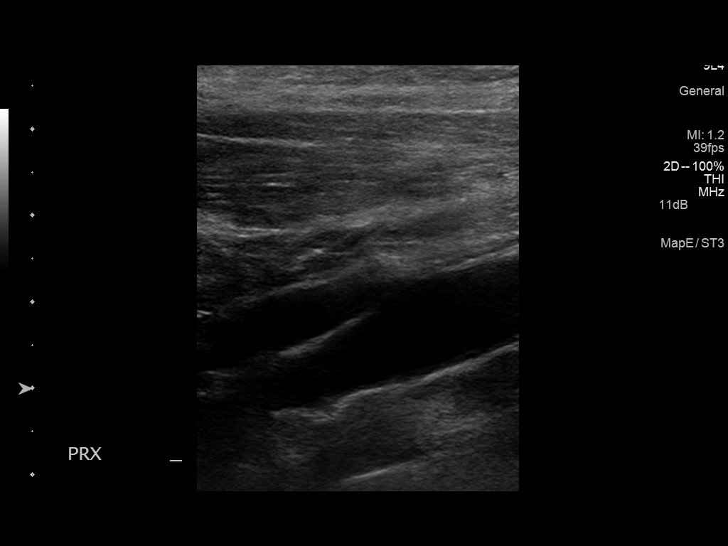
[im 22/62]
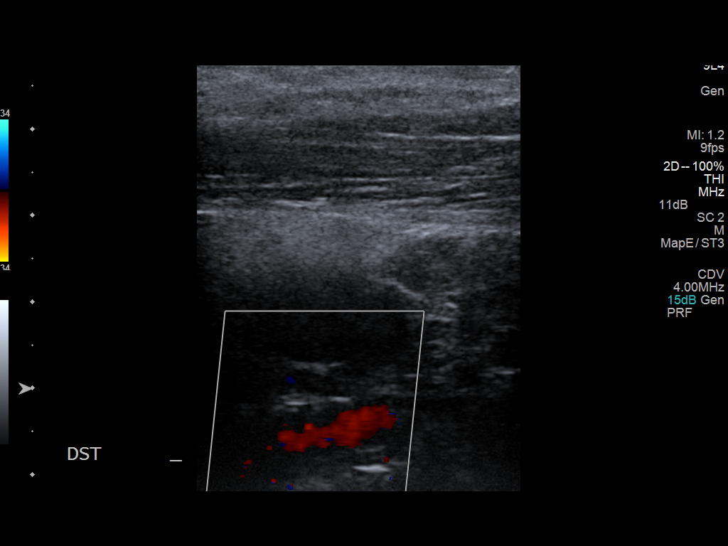
[im 27/62]
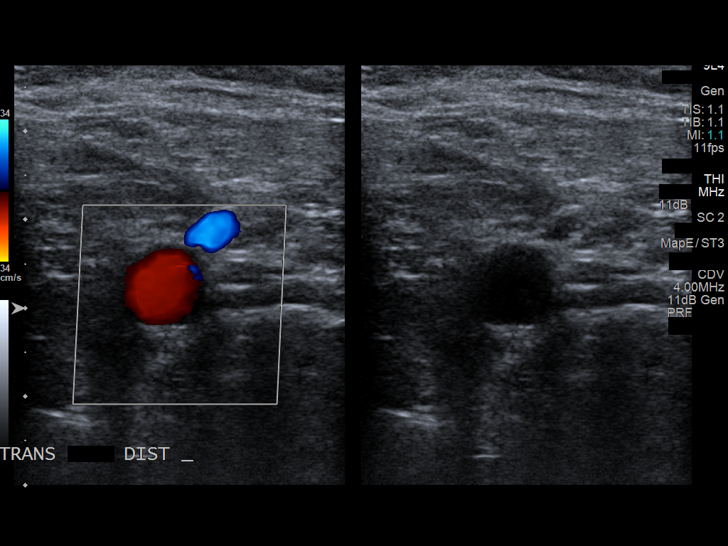
[im 32/62]
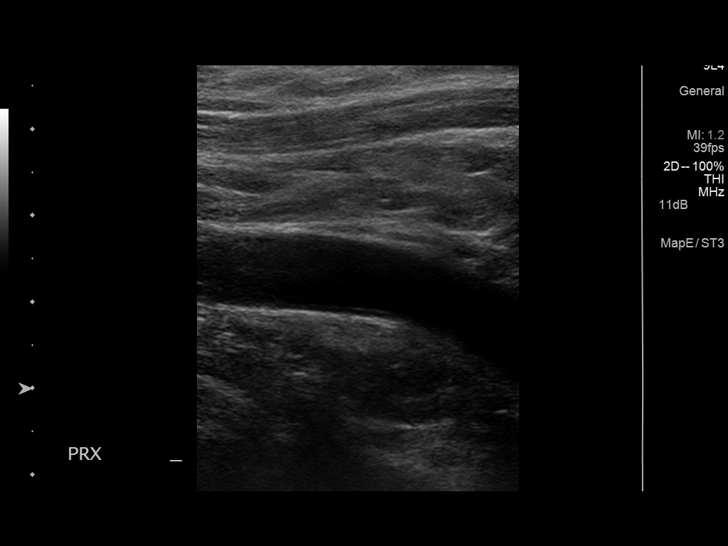
[im 35/62]
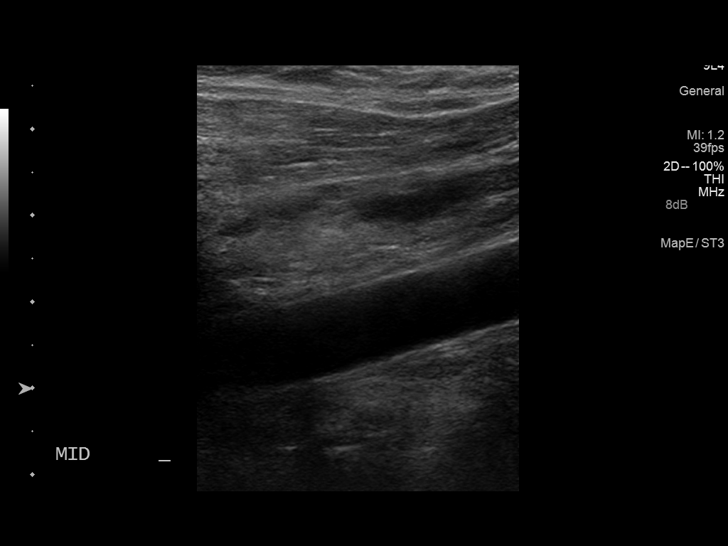
[im 40/62]
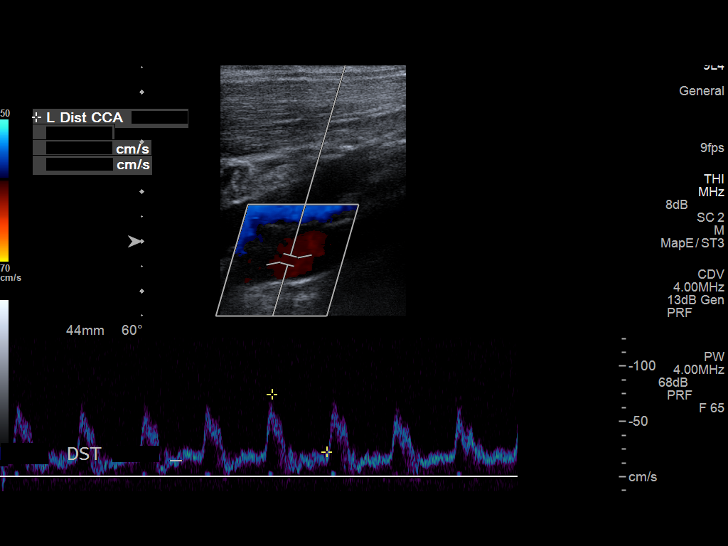
[im 46/62]
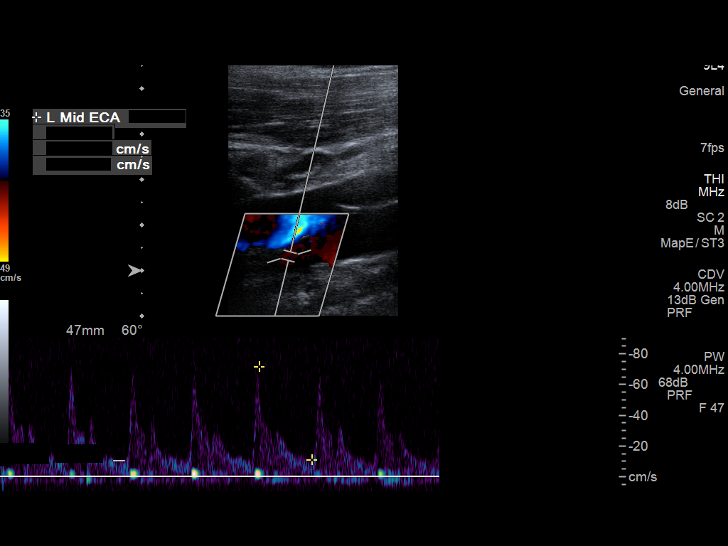
[im 51/62]
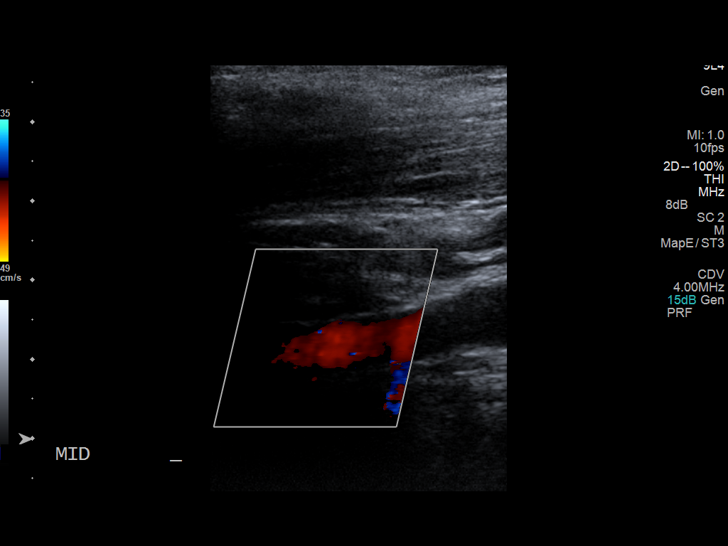
[im 56/62]
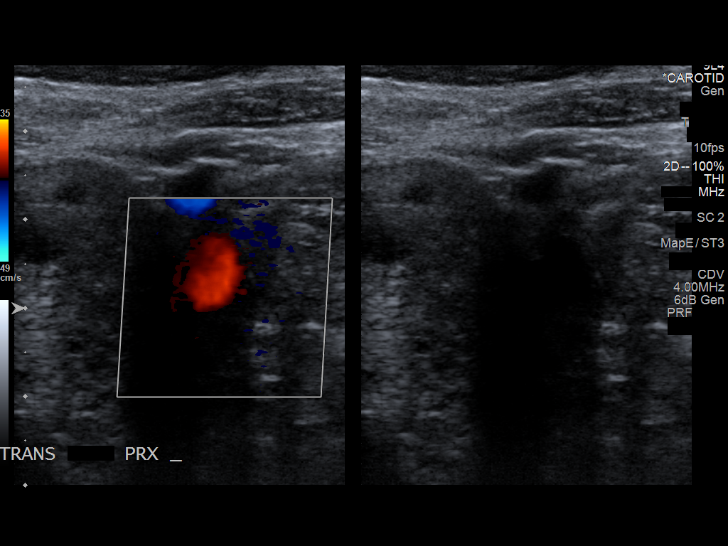
[im 62/62]
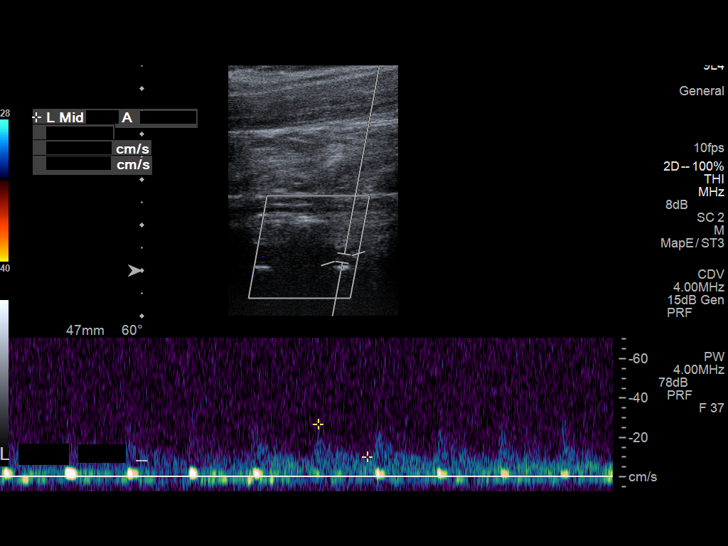

[13 of 24 positions shown; findings below may reference images not displayed]

FINDINGS: Criteria: Quantification of carotid stenosis is based on velocity
parameters that correlate the residual internal carotid diameter
with NASCET-based stenosis levels, using the diameter of the distal
internal carotid lumen as the denominator for stenosis measurement.

The following velocity measurements were obtained:

RIGHT

ICA:  86/28 cm/sec

CCA:  111/25 cm/sec

SYSTOLIC ICA/CCA RATIO:

DIASTOLIC ICA/CCA RATIO:

ECA:  100 cm/sec

LEFT

ICA:  62/31 cm/sec

CCA:  95/22 cm/sec

SYSTOLIC ICA/CCA RATIO:

DIASTOLIC ICA/CCA RATIO:

ECA:  72 cm/sec

RIGHT CAROTID ARTERY: No significant atherosclerotic plaque or
evidence of stenosis.

RIGHT VERTEBRAL ARTERY:  Patent with antegrade flow.

LEFT CAROTID ARTERY: No significant atherosclerotic plaque or
evidence of stenosis.

LEFT VERTEBRAL ARTERY:  Patent with antegrade flow.
IMPRESSION: Unremarkable bilateral carotid duplex ultrasound.

Vertebral arteries are patent with antegrade flow.

## 2016-04-14 IMAGING — MR MRI CERVICAL SPINE WITHOUT AND WITH CONTRAST
9 series · 43 of 48 positions shown · IV contrast (multihance)
Comparison: None.

CLINICAL DATA: Left arm numbness beginning 2 days ago.

EXAM:
MRI CERVICAL SPINE WITHOUT AND WITH CONTRAST
TECHNIQUE: Multiplanar and multiecho pulse sequences of the cervical spine, to
include the craniocervical junction and cervicothoracic junction,
were obtained according to standard protocol without and with
intravenous contrast.
CONTRAST:  20 cc MultiHance

[Series 4: T1 · sagittal · 3.0mm · 0.70mm/px · 4 of 16 slices shown (1 of 2)]
[im 1/16]
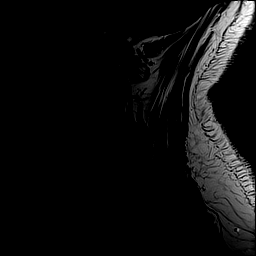
[im 6/16]
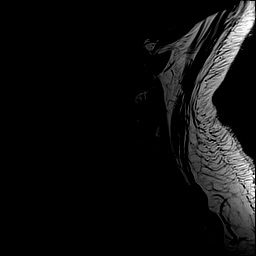
[im 11/16]
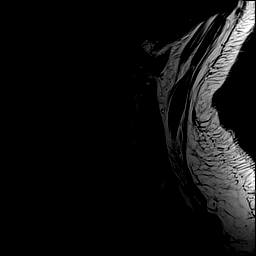
[im 16/16]
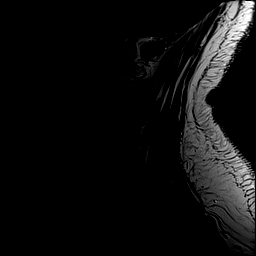

[Series 5: STIR · sagittal · 3.0mm · 0.70mm/px · 4 of 16 slices shown]
[im 1/16]
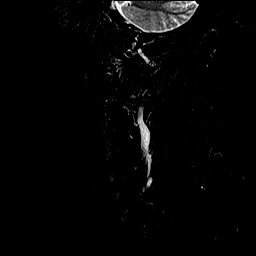
[im 6/16]
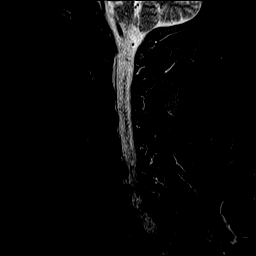
[im 11/16]
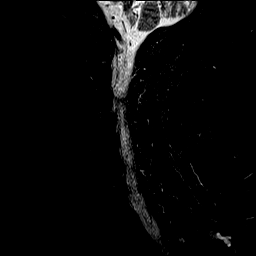
[im 16/16]
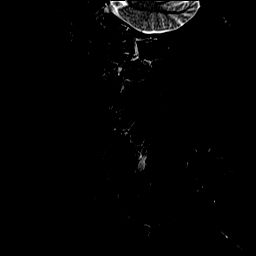

[Series 6: FLAIR · sagittal · 3.0mm · 0.35mm/px · 4 of 16 slices shown]
[im 1/16]
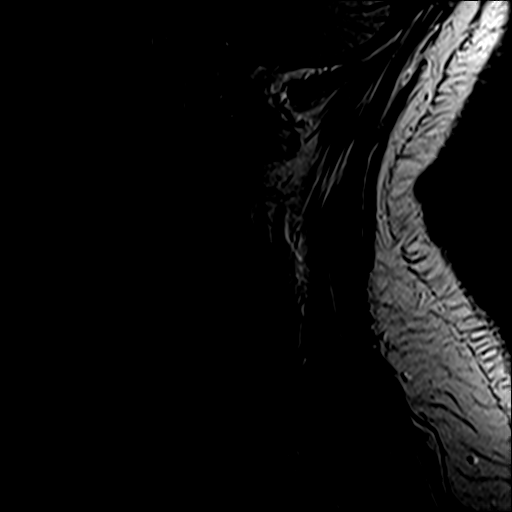
[im 6/16]
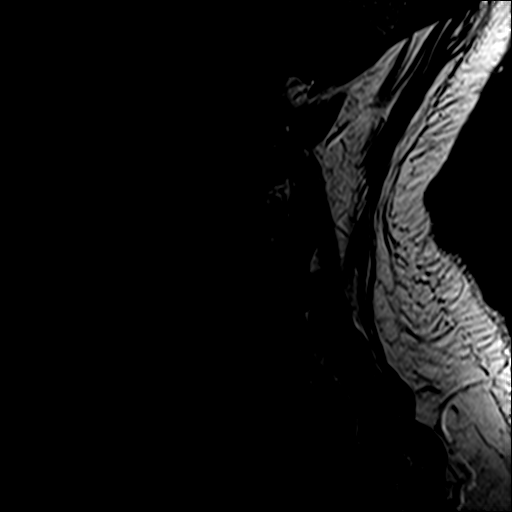
[im 11/16]
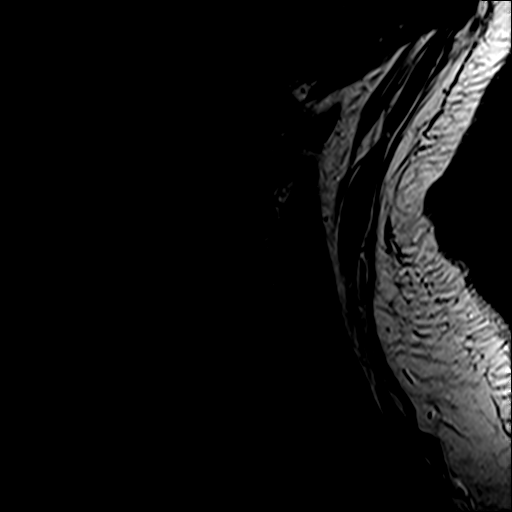
[im 16/16]
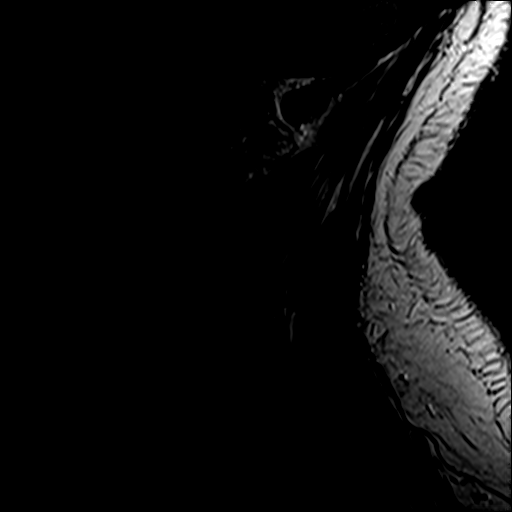

[Series 8: mpgr ax · axial · 3.0mm · 0.35mm/px · z∈[-104,-89]mm · 2 of 28 slices shown]
[im 1/28]
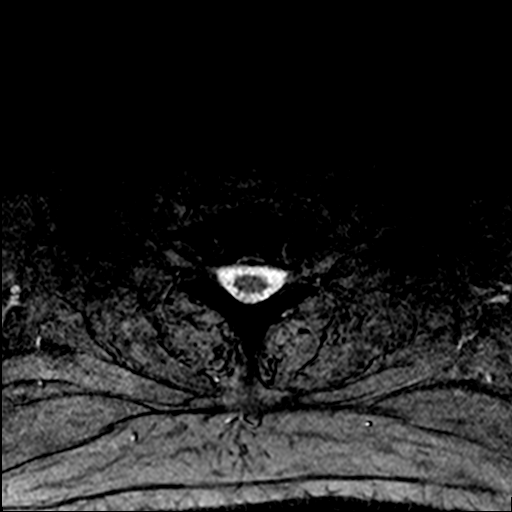
[im 5/28]
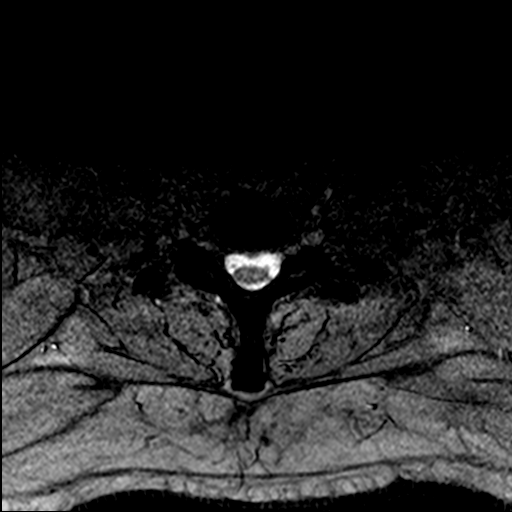

[Series 9: T1 · axial · 3.0mm · 0.56mm/px · z∈[-104,-1]mm · 7 of 28 slices shown (2 of 2)]
[im 1/28]
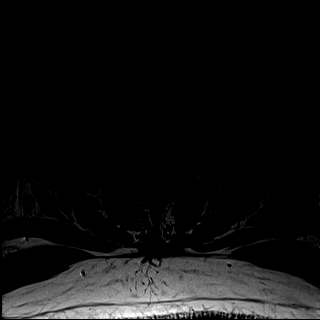
[im 5/28]
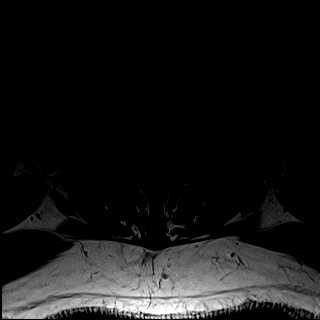
[im 10/28]
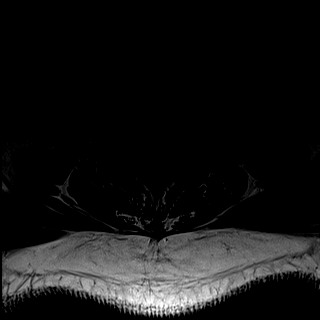
[im 14/28]
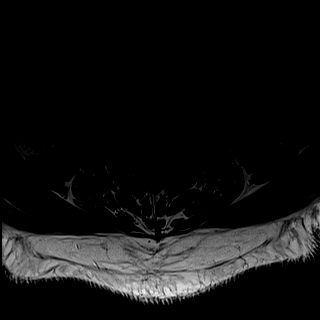
[im 19/28]
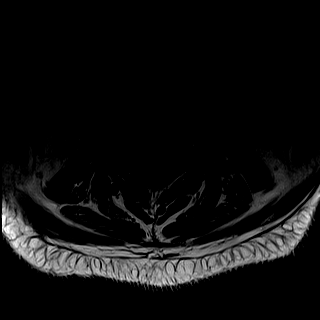
[im 23/28]
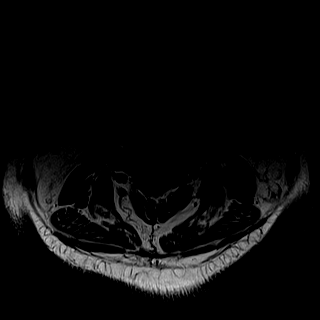
[im 28/28]
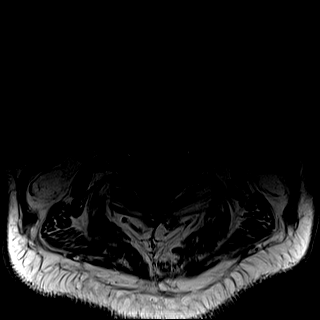

[Series 12: T1 fat-sat post-contrast · sagittal · 3.0mm · 0.70mm/px · 4 of 16 slices shown]
[im 1/16]
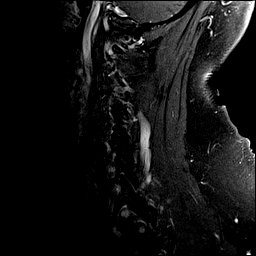
[im 6/16]
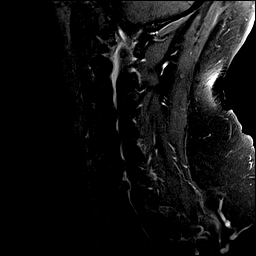
[im 11/16]
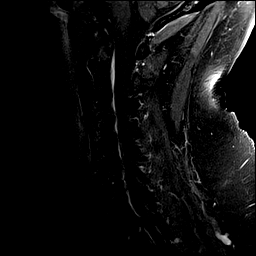
[im 16/16]
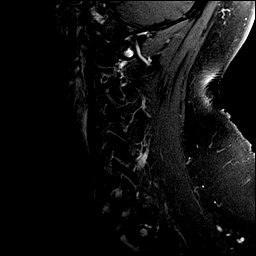

[Series 13: T1 post-contrast · axial · 3.0mm · 0.56mm/px · z∈[-111,-8]mm · 7 of 28 slices shown]
[im 1/28]
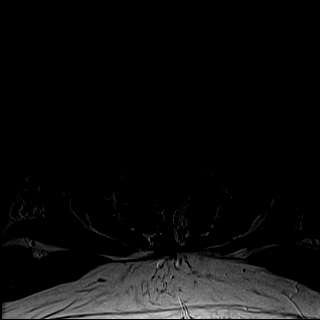
[im 5/28]
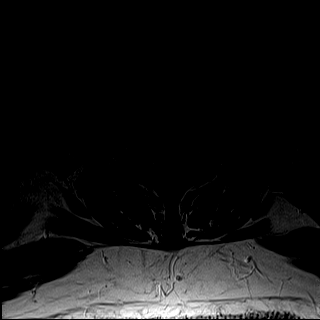
[im 10/28]
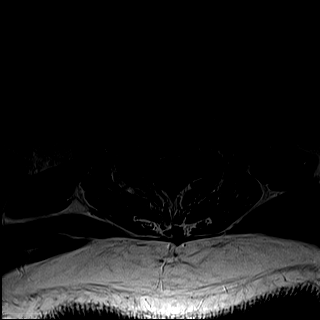
[im 14/28]
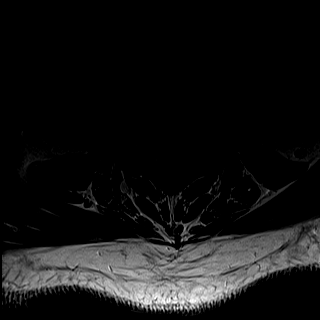
[im 19/28]
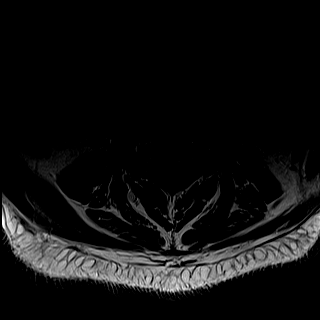
[im 23/28]
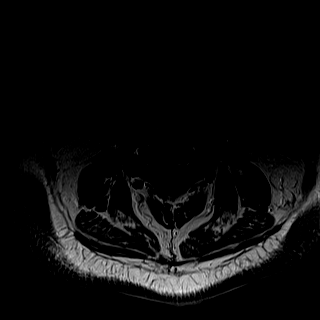
[im 28/28]
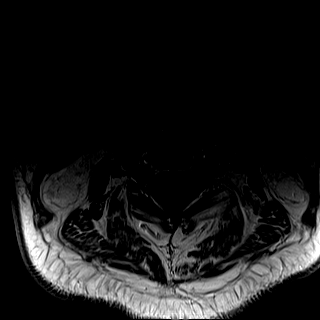

[Series 100: T2 · sagittal · 3.0mm · 0.70mm/px · 4 of 16 slices shown (1 of 2)]
[im 1/16]
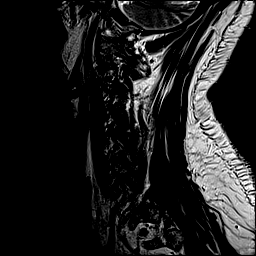
[im 6/16]
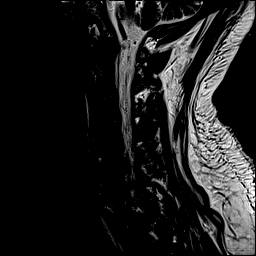
[im 11/16]
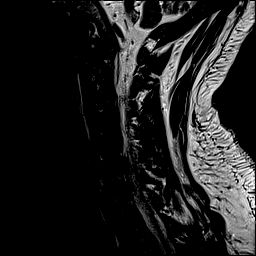
[im 16/16]
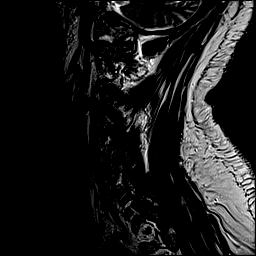

[Series 101: T2 · axial · 3.0mm · 0.70mm/px · z∈[-104,-1]mm · 7 of 28 slices shown (2 of 2)]
[im 1/28]
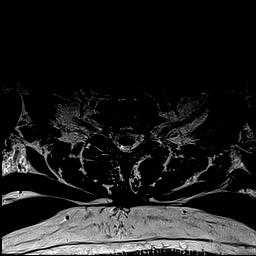
[im 5/28]
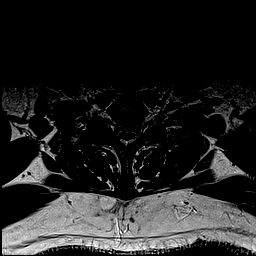
[im 10/28]
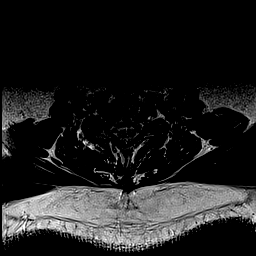
[im 14/28]
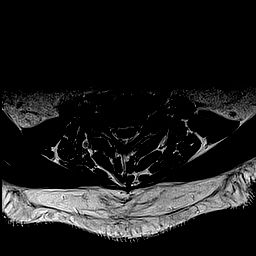
[im 19/28]
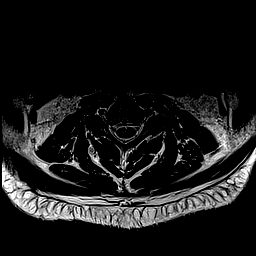
[im 23/28]
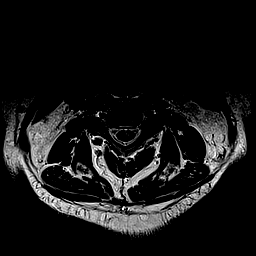
[im 28/28]
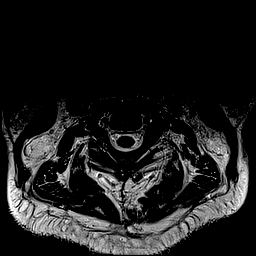

[43 of 48 positions shown; findings below may reference images not displayed]

FINDINGS: Foramen magnum is normal. There is mild osteoarthritis of the C1-2
articulation without encroachment upon the neural spaces.

C2-3:  Minimal bulging of the disc.  No stenosis.

C3-4: Mild bulging of the disc. No significant narrowing of the
canal or foramina.

C4-5: Mild uncovertebral prominence. No canal or compressive
foraminal stenosis.

C5-6: Spondylosis with endplate osteophytes and bulging of the disc
more prominent towards the right. Narrowing of the ventral
subarachnoid space but no compression of the cord. Foraminal
stenosis right more than left. Either C6 nerve root could be
affected, more likely the right.

C6-7: Spondylosis with endplate osteophytes and shallow protrusion
of disc material more prominent towards the right. Effacement of the
ventral subarachnoid space but no cord compression. Foraminal
stenosis right more than left. Either C7 nerve root could be
affected, more likely the right.

C7-T1: Small central disc protrusion without neural compression.

No abnormal cord signal.  No abnormal contrast enhancement.
IMPRESSION: Spondylosis at C5-6 and C6-7 more prominent on the right than the
left. There is potential for neural compression at these levels, but
the foraminal stenosis is considerably more pronounced on the right
than the left. I do not identify an acute soft disc herniation.

## 2016-04-19 ENCOUNTER — Other Ambulatory Visit: Payer: Self-pay | Admitting: Family Medicine

## 2016-05-31 ENCOUNTER — Other Ambulatory Visit: Payer: Self-pay | Admitting: Family Medicine

## 2016-05-31 DIAGNOSIS — E119 Type 2 diabetes mellitus without complications: Secondary | ICD-10-CM

## 2016-06-12 ENCOUNTER — Encounter (INDEPENDENT_AMBULATORY_CARE_PROVIDER_SITE_OTHER): Payer: Self-pay

## 2016-07-06 ENCOUNTER — Encounter: Payer: BLUE CROSS/BLUE SHIELD | Admitting: Family Medicine

## 2016-07-12 ENCOUNTER — Telehealth: Payer: Self-pay | Admitting: Family Medicine

## 2016-07-12 NOTE — Telephone Encounter (Signed)
Left message for patient to call. Unsure which labs he's referring to. Labs last done in June.

## 2016-07-12 NOTE — Telephone Encounter (Signed)
Patient called back, stated he did not need results, he needed the lab order. I printed out the order and refaxed it to his employer.

## 2016-07-17 ENCOUNTER — Ambulatory Visit (INDEPENDENT_AMBULATORY_CARE_PROVIDER_SITE_OTHER): Payer: BLUE CROSS/BLUE SHIELD | Admitting: Family Medicine

## 2016-07-17 ENCOUNTER — Telehealth: Payer: Self-pay | Admitting: Family Medicine

## 2016-07-17 ENCOUNTER — Encounter: Payer: Self-pay | Admitting: Family Medicine

## 2016-07-17 VITALS — BP 104/69 | HR 106 | Temp 97.6°F | Ht 73.0 in | Wt 285.7 lb

## 2016-07-17 DIAGNOSIS — E119 Type 2 diabetes mellitus without complications: Secondary | ICD-10-CM | POA: Diagnosis not present

## 2016-07-17 DIAGNOSIS — L711 Rhinophyma: Secondary | ICD-10-CM

## 2016-07-17 DIAGNOSIS — Z0000 Encounter for general adult medical examination without abnormal findings: Secondary | ICD-10-CM

## 2016-07-17 DIAGNOSIS — I10 Essential (primary) hypertension: Secondary | ICD-10-CM

## 2016-07-17 DIAGNOSIS — E7800 Pure hypercholesterolemia, unspecified: Secondary | ICD-10-CM | POA: Diagnosis not present

## 2016-07-17 MED ORDER — DAPAGLIFLOZIN PROPANEDIOL 10 MG PO TABS: 10.0000 mg | ORAL_TABLET | Freq: Every day | ORAL | 4 refills | Status: DC

## 2016-07-17 MED ORDER — MELOXICAM 15 MG PO TABS: 15.0000 mg | ORAL_TABLET | Freq: Every day | ORAL | 4 refills | Status: DC

## 2016-07-17 MED ORDER — ROSUVASTATIN CALCIUM 5 MG PO TABS: 5.0000 mg | ORAL_TABLET | Freq: Every day | ORAL | 4 refills | Status: DC

## 2016-07-17 MED ORDER — LISINOPRIL 10 MG PO TABS: 10.0000 mg | ORAL_TABLET | Freq: Every day | ORAL | 4 refills | Status: DC

## 2016-07-17 MED ORDER — METFORMIN HCL 500 MG PO TABS: 1000.0000 mg | ORAL_TABLET | Freq: Two times a day (BID) | ORAL | 4 refills | Status: DC

## 2016-07-17 NOTE — Assessment & Plan Note (Signed)
The current medical regimen is effective;  continue present plan and medications.  

## 2016-07-17 NOTE — Assessment & Plan Note (Signed)
Has apt this winter

## 2016-07-17 NOTE — Telephone Encounter (Signed)
Pt needs all lab orders for his 6 month fu sent to [facility]. Please fax orders to # [phone]. Thanks.

## 2016-07-17 NOTE — Progress Notes (Signed)
BP 104/69 (BP Location: Left Arm, Patient Position: Sitting, Cuff Size: Large)   Pulse (!) 106   Temp 97.6 F (36.4 C)   Ht 6\' 1"  (1.854 m)   Wt 285 lb 11.2 oz (129.6 kg)   SpO2 95%   BMI 37.69 kg/m    Subjective:    Patient ID: Dustin Fletcher, male    DOB: Feb 25, 1961, 55 y.o.   MRN: [id number]  HPI: Dustin Fletcher is a 55 y.o. male  Chief Complaint  Patient presents with  . Annual Exam    Patient all in all doing well diabetes no issues noted low blood sugar spells Blood pressure doing well no complaints Crestor doing well again no complaints taking all medications faithfully MetroGel for rosacea is doing okay for rhinophyma is planning to see ENT at the first of the year to have further evaluated and possibly surgical repair.  Relevant past medical, surgical, family and social history reviewed and updated as indicated. Interim medical history since our last visit reviewed. Allergies and medications reviewed and updated.  Review of Systems  Constitutional: Negative.   HENT: Negative.   Eyes: Negative.   Respiratory: Negative.   Cardiovascular: Negative.   Gastrointestinal: Negative.   Endocrine: Negative.   Genitourinary: Negative.   Musculoskeletal: Negative.   Skin: Negative.   Allergic/Immunologic: Negative.   Neurological: Negative.   Hematological: Negative.   Psychiatric/Behavioral: Negative.     Per HPI unless specifically indicated above     Objective:    BP 104/69 (BP Location: Left Arm, Patient Position: Sitting, Cuff Size: Large)   Pulse (!) 106   Temp 97.6 F (36.4 C)   Ht 6\' 1"  (1.854 m)   Wt 285 lb 11.2 oz (129.6 kg)   SpO2 95%   BMI 37.69 kg/m   Wt Readings from Last 3 Encounters:  07/17/16 285 lb 11.2 oz (129.6 kg)  11/22/15 284 lb (128.8 kg)  08/23/15 288 lb (130.6 kg)    Physical Exam  Constitutional: He is oriented to person, place, and time. He appears well-developed and well-nourished.  HENT:  Head: Normocephalic and  atraumatic.  Right Ear: External ear normal.  Left Ear: External ear normal.  Eyes: Conjunctivae and EOM are normal. Pupils are equal, round, and reactive to light.  Neck: Normal range of motion. Neck supple.  Cardiovascular: Normal rate, regular rhythm, normal heart sounds and intact distal pulses.   Pulmonary/Chest: Effort normal and breath sounds normal.  Abdominal: Soft. Bowel sounds are normal. There is no splenomegaly or hepatomegaly.  Genitourinary: Rectum normal, prostate normal and penis normal.  Musculoskeletal: Normal range of motion.  Neurological: He is alert and oriented to person, place, and time. He has normal reflexes.  Skin: No rash noted. No erythema.  Psychiatric: He has a normal mood and affect. His behavior is normal. Judgment and thought content normal.    Results for orders placed or performed in visit on 05/24/16  Hemoglobin A1c  Result Value Ref Range   Hemoglobin A1C 6.9       Assessment & Plan:   Problem List Items Addressed This Visit      Cardiovascular and Mediastinum   Hypertension    The current medical regimen is effective;  continue present plan and medications.       Relevant Medications   lisinopril (PRINIVIL,ZESTRIL) 10 MG tablet   rosuvastatin (CRESTOR) 5 MG tablet     Endocrine   Diabetes mellitus without complication (HCC)    The current medical  regimen is effective;  continue present plan and medications.       Relevant Medications   lisinopril (PRINIVIL,ZESTRIL) 10 MG tablet   metFORMIN (GLUCOPHAGE) 500 MG tablet   rosuvastatin (CRESTOR) 5 MG tablet   dapagliflozin propanediol ([name]) 10 MG TABS tablet     Other   Hyperlipidemia    The current medical regimen is effective;  continue present plan and medications.       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

## 2016-07-17 NOTE — Telephone Encounter (Signed)
Order written, will fax

## 2017-01-08 ENCOUNTER — Other Ambulatory Visit: Payer: Self-pay

## 2017-01-08 DIAGNOSIS — E7800 Pure hypercholesterolemia, unspecified: Secondary | ICD-10-CM

## 2017-01-08 DIAGNOSIS — I10 Essential (primary) hypertension: Secondary | ICD-10-CM

## 2017-01-08 DIAGNOSIS — E119 Type 2 diabetes mellitus without complications: Secondary | ICD-10-CM

## 2017-01-09 LAB — CMP12+LP+TP+TSH+6AC+CBC/D/PLT
ALT: 16 IU/L (ref 0–44)
AST: 13 IU/L (ref 0–40)
Albumin/Globulin Ratio: 1.5 (ref 1.2–2.2)
Albumin: 4.4 g/dL (ref 3.5–5.5)
Alkaline Phosphatase: 67 IU/L (ref 39–117)
BASOS: 0 %
BILIRUBIN TOTAL: 0.4 mg/dL (ref 0.0–1.2)
BUN / CREAT RATIO: 20 (ref 9–20)
BUN: 17 mg/dL (ref 6–24)
Basophils Absolute: 0 10*3/uL (ref 0.0–0.2)
CREATININE: 0.87 mg/dL (ref 0.76–1.27)
Calcium: 9.7 mg/dL (ref 8.7–10.2)
Chloride: 101 mmol/L (ref 96–106)
Chol/HDL Ratio: 4 ratio (ref 0.0–5.0)
Cholesterol, Total: 128 mg/dL (ref 100–199)
EOS (ABSOLUTE): 0.2 10*3/uL (ref 0.0–0.4)
EOS: 2 %
ESTIMATED CHD RISK: 0.7 times avg. (ref 0.0–1.0)
Free Thyroxine Index: 2.3 (ref 1.2–4.9)
GFR calc Af Amer: 112 mL/min/{1.73_m2} (ref >59)
GFR, EST NON AFRICAN AMERICAN: 97 mL/min/{1.73_m2} (ref >59)
GGT: 31 IU/L (ref 0–65)
GLUCOSE: 170 mg/dL — AB (ref 65–99)
Globulin, Total: 2.9 g/dL (ref 1.5–4.5)
HDL: 32 mg/dL — ABNORMAL LOW (ref >39)
HEMATOCRIT: 46.7 % (ref 37.5–51.0)
HEMOGLOBIN: 15.3 g/dL (ref 13.0–17.7)
IRON: 88 ug/dL (ref 38–169)
Immature Grans (Abs): 0 10*3/uL (ref 0.0–0.1)
Immature Granulocytes: 0 %
LDH: 117 IU/L — ABNORMAL LOW (ref 121–224)
LDL Calculated: 25 mg/dL (ref 0–99)
LYMPHS ABS: 2.1 10*3/uL (ref 0.7–3.1)
Lymphs: 33 %
MCH: 28.6 pg (ref 26.6–33.0)
MCHC: 32.8 g/dL (ref 31.5–35.7)
MCV: 87 fL (ref 79–97)
Monocytes Absolute: 0.7 10*3/uL (ref 0.1–0.9)
Monocytes: 11 %
Neutrophils Absolute: 3.3 10*3/uL (ref 1.4–7.0)
Neutrophils: 54 %
PHOSPHORUS: 3 mg/dL (ref 2.5–4.5)
PLATELETS: 214 10*3/uL (ref 150–379)
Potassium: 4.4 mmol/L (ref 3.5–5.2)
RBC: 5.35 x10E6/uL (ref 4.14–5.80)
RDW: 14.9 % (ref 12.3–15.4)
SODIUM: 140 mmol/L (ref 134–144)
T3 UPTAKE RATIO: 27 % (ref 24–39)
T4, Total: 8.5 ug/dL (ref 4.5–12.0)
TOTAL PROTEIN: 7.3 g/dL (ref 6.0–8.5)
TSH: 1.04 u[IU]/mL (ref 0.450–4.500)
Triglycerides: 353 mg/dL — ABNORMAL HIGH (ref 0–149)
URIC ACID: 5.3 mg/dL (ref 3.7–8.6)
VLDL CHOLESTEROL CAL: 71 mg/dL — AB (ref 5–40)
WBC: 6.3 10*3/uL (ref 3.4–10.8)

## 2017-01-09 LAB — HGB A1C W/O EAG: Hgb A1c MFr Bld: 7.2 % — ABNORMAL HIGH (ref 4.8–5.6)

## 2017-01-15 ENCOUNTER — Encounter: Payer: Self-pay | Admitting: Family Medicine

## 2017-01-15 ENCOUNTER — Ambulatory Visit (INDEPENDENT_AMBULATORY_CARE_PROVIDER_SITE_OTHER): Payer: BLUE CROSS/BLUE SHIELD | Admitting: Family Medicine

## 2017-01-15 VITALS — BP 114/74 | HR 74 | Ht 74.0 in | Wt 289.0 lb

## 2017-01-15 DIAGNOSIS — I10 Essential (primary) hypertension: Secondary | ICD-10-CM | POA: Diagnosis not present

## 2017-01-15 DIAGNOSIS — E7800 Pure hypercholesterolemia, unspecified: Secondary | ICD-10-CM

## 2017-01-15 DIAGNOSIS — E119 Type 2 diabetes mellitus without complications: Secondary | ICD-10-CM | POA: Diagnosis not present

## 2017-01-15 NOTE — Assessment & Plan Note (Signed)
The current medical regimen is effective;  continue present plan and medications.  

## 2017-01-15 NOTE — Progress Notes (Signed)
BP 114/74   Pulse 74   Ht 6\' 2"  (1.88 m)   Wt 289 lb (131.1 kg)   SpO2 97%   BMI 37.11 kg/m    Subjective:    Patient ID: Dustin Fletcher, male    DOB: [birth year], 56 y.o.   MRN: [id number]  HPI: Dustin Fletcher is a 56 y.o. male  Patient recheck diabetes doing well no complaints noted low blood sugar spells taking medications without problems. Blood pressure good control no issues. Cholesterol doing well no new issues  Relevant past medical, surgical, family and social history reviewed and updated as indicated. Interim medical history since our last visit reviewed. Allergies and medications reviewed and updated.  Review of Systems  Constitutional: Negative.   Respiratory: Negative.   Cardiovascular: Negative.     Per HPI unless specifically indicated above     Objective:    BP 114/74   Pulse 74   Ht 6\' 2"  (1.88 m)   Wt 289 lb (131.1 kg)   SpO2 97%   BMI 37.11 kg/m   Wt Readings from Last 3 Encounters:  01/15/17 289 lb (131.1 kg)  07/17/16 285 lb 11.2 oz (129.6 kg)  11/22/15 284 lb (128.8 kg)    Physical Exam  Constitutional: He is oriented to person, place, and time. He appears well-developed and well-nourished.  HENT:  Head: Normocephalic and atraumatic.  Eyes: Conjunctivae and EOM are normal.  Neck: Normal range of motion.  Cardiovascular: Normal rate, regular rhythm and normal heart sounds.   Pulmonary/Chest: Effort normal and breath sounds normal.  Musculoskeletal: Normal range of motion.  Neurological: He is alert and oriented to person, place, and time.  Skin: No erythema.  Psychiatric: He has a normal mood and affect. His behavior is normal. Judgment and thought content normal.    Results for orders placed or performed in visit on 01/08/17  CMP12+LP+TP+TSH+6AC+CBC/D/Plt  Result Value Ref Range   Glucose 170 (H) 65 - 99 mg/dL   Uric Acid 5.3 3.7 - 8.6 mg/dL   BUN 17 6 - 24 mg/dL   Creatinine, Ser 4.090.87 0.76 - 1.27 mg/dL   GFR calc non Af  Amer 97 >59 mL/min/1.73   GFR calc Af Amer 112 >59 mL/min/1.73   BUN/Creatinine Ratio 20 9 - 20   Sodium 140 134 - 144 mmol/L   Potassium 4.4 3.5 - 5.2 mmol/L   Chloride 101 96 - 106 mmol/L   Calcium 9.7 8.7 - 10.2 mg/dL   Phosphorus 3.0 2.5 - 4.5 mg/dL   Total Protein 7.3 6.0 - 8.5 g/dL   Albumin 4.4 3.5 - 5.5 g/dL   Globulin, Total 2.9 1.5 - 4.5 g/dL   Albumin/Globulin Ratio 1.5 1.2 - 2.2   Bilirubin Total 0.4 0.0 - 1.2 mg/dL   Alkaline Phosphatase 67 39 - 117 IU/L   LDH 117 (L) 121 - 224 IU/L   AST 13 0 - 40 IU/L   ALT 16 0 - 44 IU/L   GGT 31 0 - 65 IU/L   Iron 88 38 - 169 ug/dL   Cholesterol, Total 811128 100 - 199 mg/dL   Triglycerides 914353 (H) 0 - 149 mg/dL   HDL 32 (L) >78>39 mg/dL   VLDL Cholesterol Cal 71 (H) 5 - 40 mg/dL   LDL Calculated 25 0 - 99 mg/dL   Chol/HDL Ratio 4.0 0.0 - 5.0 ratio   Estimated CHD Risk 0.7 0.0 - 1.0 times avg.   TSH 1.040 0.450 - 4.500 uIU/mL  T4, Total 8.5 4.5 - 12.0 ug/dL   T3 Uptake Ratio 27 24 - 39 %   Free Thyroxine Index 2.3 1.2 - 4.9   WBC 6.3 3.4 - 10.8 x10E3/uL   RBC 5.35 4.14 - 5.80 x10E6/uL   Hemoglobin 15.3 13.0 - 17.7 g/dL   Hematocrit 16.1 09.6 - 51.0 %   MCV 87 79 - 97 fL   MCH 28.6 26.6 - 33.0 pg   MCHC 32.8 31.5 - 35.7 g/dL   RDW 04.5 40.9 - 81.1 %   Platelets 214 150 - 379 x10E3/uL   Neutrophils 54 Not Estab. %   Lymphs 33 Not Estab. %   Monocytes 11 Not Estab. %   Eos 2 Not Estab. %   Basos 0 Not Estab. %   Neutrophils Absolute 3.3 1.4 - 7.0 x10E3/uL   Lymphocytes Absolute 2.1 0.7 - 3.1 x10E3/uL   Monocytes Absolute 0.7 0.1 - 0.9 x10E3/uL   EOS (ABSOLUTE) 0.2 0.0 - 0.4 x10E3/uL   Basophils Absolute 0.0 0.0 - 0.2 x10E3/uL   Immature Granulocytes 0 Not Estab. %   Immature Grans (Abs) 0.0 0.0 - 0.1 x10E3/uL  Hgb A1c w/o eAG  Result Value Ref Range   Hgb A1c MFr Bld 7.2 (H) 4.8 - 5.6 %      Assessment & Plan:   Problem List Items Addressed This Visit      Cardiovascular and Mediastinum   Hypertension - Primary     The current medical regimen is effective;  continue present plan and medications.         Endocrine   Diabetes mellitus without complication (HCC)    The current medical regimen is effective;  continue present plan and medications.         Other   Hyperlipidemia    The current medical regimen is effective;  continue present plan and medications.           Follow up plan: Return in about 6 months (around 07/18/2017) for Hemoglobin A1c, Physical Exam.

## 2017-01-25 ENCOUNTER — Ambulatory Visit: Payer: Self-pay | Admitting: Medical

## 2017-01-25 VITALS — BP 110/70 | HR 110 | Temp 101.3°F | Resp 18 | Ht 74.0 in | Wt 286.0 lb

## 2017-01-25 DIAGNOSIS — R112 Nausea with vomiting, unspecified: Secondary | ICD-10-CM

## 2017-01-25 MED ORDER — ONDANSETRON 4 MG PO TBDP: 4.0000 mg | ORAL_TABLET | Freq: Three times a day (TID) | ORAL | 0 refills | Status: DC | PRN

## 2017-01-25 NOTE — Progress Notes (Signed)
Patient reports he felt fine when he woke up. Ate hamburger patty on bread for breakfast, came to work.  Was mowing grass with raincoat on.  Began to feel bad, hot then chills, vomited, shaking.  Patient reports brother dx with norovirus on Monday.  Patient was around brother over the weekend.    Subjective:    Patient ID: Dustin Fletcher, male    DOB: 1961-05-01, 56 y.o.   MRN: [id number]  HPI 56 yo started this morning at work about 8 am , headache, felt hot, took off raincoat and got cold and pressure in stomach and shoulders aching. , going back to his office and vomited  2 times.  Body ahces, hot and cold now and body aches  at the base of neck to shoulders/. Brother was sick and admitted to the hospital for Norvovirus ( He did not visit brother in the hospital) though they were together  Saturday night. His bother was admitted for possible heart infection and then they found out he had the Norovirus, discharged yesterday.  . Also his daughter had a  virus not sure what kind with vomiting and diarrhea.  She and her children both had these symptoms.Unsure of there actual diagnosis.   Review of Systems  Constitutional: Positive for chills, fatigue and fever.  HENT: Positive for rhinorrhea. Negative for sore throat.   Eyes: Negative for discharge and itching.  Respiratory: Positive for cough. Negative for shortness of breath and wheezing.   Gastrointestinal: Positive for abdominal pain, diarrhea, nausea and vomiting.  Genitourinary: Negative for dysuria and hematuria.  Musculoskeletal: Positive for back pain and neck pain.  Allergic/Immunologic: Negative for environmental allergies and food allergies.  Neurological: Positive for dizziness and headaches. Negative for syncope.  Hematological: Negative for adenopathy.  Psychiatric/Behavioral: Negative for confusion and hallucinations.   Abdominal pain prior to vomiting, none now. Belching in room. No vomiting or diarrhea in clinic.     Objective:   Physical Exam  Constitutional: He is oriented to person, place, and time. He appears well-developed and well-nourished.  HENT:  Head: Normocephalic and atraumatic.  Right Ear: External ear normal.  Left Ear: External ear normal.  Mouth/Throat: Oropharynx is clear and moist.  Eyes: Conjunctivae and EOM are normal. Pupils are equal, round, and reactive to light.  Neck: Normal range of motion. Neck supple.  Cardiovascular: Regular rhythm.  Tachycardia present.  Exam reveals no gallop and no friction rub.   No murmur heard. Pulmonary/Chest: Effort normal and breath sounds normal.  Abdominal: Soft. There is no tenderness. There is no rebound and no guarding.  Musculoskeletal: Normal range of motion.  Lymphadenopathy:    He has no cervical adenopathy.  Neurological: He is alert and oriented to person, place, and time.  Skin: Skin is warm and dry.  Psychiatric: He has a normal mood and affect. His behavior is normal. Judgment and thought content normal.  Nursing note and vitals reviewed.   Can flex head and touch chest. He had one BM today that was soft. Which he stated is not unusual for him.     Assessment & Plan:  Nausea, Vomiting, Fever. Bodyaches, possibly what his sister had or his brother. Though patient has not had any diarrhea yet. Bland diet Reviewed with patient Gatorade if not eating well. E-prescribed Zofran  4 ODT mg every 8 hours as needed for nausea or vomiting. OTC Imodium take as directed for diarrhea. Given Zofran 4mg  ODT (lot [id number] exp 9/ 2018 in clinic and Tylenol 1  gram ( GS [id number] exp  4/21) by mouth( next dose in eight hours).   Monitor and treat fever with Motrin or Tylenol take as directed. Return to the clinic in  2- 5 days if not improving. If over the weekend worsening to seek out medical care at an urgent care or the Emergency Department. Work note given to patient , may not return to work unless fever free x 48 hours with no Motrin or Tylenol.  Patient says he is well enough to drive home.  No questions at discharge , verbalizes understanding.

## 2017-01-26 ENCOUNTER — Ambulatory Visit: Payer: Self-pay | Admitting: Medical

## 2017-01-26 VITALS — BP 80/60 | HR 109 | Temp 98.1°F | Resp 16 | Ht 74.0 in | Wt 280.0 lb

## 2017-01-26 DIAGNOSIS — E860 Dehydration: Secondary | ICD-10-CM

## 2017-01-26 DIAGNOSIS — R509 Fever, unspecified: Secondary | ICD-10-CM

## 2017-01-26 LAB — COMPREHENSIVE METABOLIC PANEL
ALBUMIN: 3.9 g/dL (ref 3.5–5.5)
ALK PHOS: 57 IU/L (ref 39–117)
ALT: 15 IU/L (ref 0–44)
AST: 13 IU/L (ref 0–40)
Albumin/Globulin Ratio: 1.6 (ref 1.2–2.2)
BUN / CREAT RATIO: 20 (ref 9–20)
BUN: 24 mg/dL (ref 6–24)
Bilirubin Total: 0.4 mg/dL (ref 0.0–1.2)
CALCIUM: 9.2 mg/dL (ref 8.7–10.2)
CO2: 23 mmol/L (ref 18–29)
CREATININE: 1.22 mg/dL (ref 0.76–1.27)
Chloride: 100 mmol/L (ref 96–106)
GFR calc Af Amer: 77 mL/min/{1.73_m2} (ref >59)
GFR calc non Af Amer: 66 mL/min/{1.73_m2} (ref >59)
GLUCOSE: 141 mg/dL — AB (ref 65–99)
Globulin, Total: 2.4 g/dL (ref 1.5–4.5)
Potassium: 4.2 mmol/L (ref 3.5–5.2)
Sodium: 139 mmol/L (ref 134–144)
TOTAL PROTEIN: 6.3 g/dL (ref 6.0–8.5)

## 2017-01-26 LAB — CBC WITH DIFFERENTIAL/PLATELET
BASOS ABS: 0 10*3/uL (ref 0.0–0.2)
Basos: 0 %
EOS (ABSOLUTE): 0.1 10*3/uL (ref 0.0–0.4)
Eos: 1 %
HEMATOCRIT: 41.4 % (ref 37.5–51.0)
Hemoglobin: 13.8 g/dL (ref 13.0–17.7)
Immature Grans (Abs): 0 10*3/uL (ref 0.0–0.1)
Immature Granulocytes: 0 %
LYMPHS ABS: 1.1 10*3/uL (ref 0.7–3.1)
Lymphs: 18 %
MCH: 28.3 pg (ref 26.6–33.0)
MCHC: 33.3 g/dL (ref 31.5–35.7)
MCV: 85 fL (ref 79–97)
MONOS ABS: 0.6 10*3/uL (ref 0.1–0.9)
Monocytes: 10 %
NEUTROS PCT: 71 %
Neutrophils Absolute: 4.1 10*3/uL (ref 1.4–7.0)
PLATELETS: 160 10*3/uL (ref 150–379)
RBC: 4.87 x10E6/uL (ref 4.14–5.80)
RDW: 14.1 % (ref 12.3–15.4)
WBC: 5.8 10*3/uL (ref 3.4–10.8)

## 2017-01-26 MED ORDER — SODIUM CHLORIDE 0.9 % IV BOLUS (SEPSIS)
1000.0000 mL | Freq: Once | INTRAVENOUS | Status: AC
  Administered 2017-01-26: 1000 mL via INTRAVENOUS

## 2017-01-26 NOTE — Progress Notes (Signed)
Patient ID: Dustin DoyneMichael C Fletcher, male   DOB: 1961/07/24, 56 y.o.   MRN: [id number]

## 2017-01-26 NOTE — Progress Notes (Addendum)
Called patient with lab results about  6 pm.  All labs within normal limits except glucose. These were non-fasting labs and patient is a diabetic. He is to return to the clinic on Monday if he is not well.  Subjective:    Patient ID: Dustin Fletcher, male    DOB: 03-28-1961, 56 y.o.   MRN: [id number]  HPI  56 yo male seen yesterday  with fever and nausea and vomiting, went home , laid down on couch for 2 hours then drank some water and ate cantalope, went back to the couch and stayed till 8:45pm.  Took a shower and then went to bed took 4  antipyretic medication ( does not know name of medication).  Woke up this morning with a headache.Took  2 antipyretics today. Not sure of the name of antipyretic. Came to clinic for recheck per my request. Says he feels better than he did yesterday.   Review of Systems  Constitutional: Positive for fever. Negative for chills.  Eyes: Negative.   Respiratory: Negative.   Cardiovascular: Negative.   Gastrointestinal: Negative for diarrhea, nausea and vomiting.  Genitourinary: Negative for hematuria.  Musculoskeletal: Positive for myalgias.  Neurological: Positive for headaches.       Objective:   Physical Exam  Constitutional: He is oriented to person, place, and time. He appears well-developed and well-nourished.  HENT:  Head: Normocephalic and atraumatic.  Eyes: EOM are normal. Pupils are equal, round, and reactive to light.  Neck: Normal range of motion.  Neurological: He is alert and oriented to person, place, and time.   bp recheck  88/62 sitting. Muscle spasm back of neck he says is his norm. Still has soreness over shoulders.  Skin sweaty and calamy.    Assessment & Plan:  Dehydration low blood pressure. 1Liter NS in room. Dishcharged at  1:40pm ? If he had Influenza. Or gastritis ( which family members had) Rest and increase fluids/gatorade if not eating. CBC w/diff and Met C STAT. Recheck of blood pressure after fluids  112/70.   Feeling better at discharge. Headache resolved. Skin dry , gait wnl. Work note given to patient for today and tomorrow. Will call patient with results. No questions at discharge, verabalizes understanding.  Work note given to patient for  Friday and Saturday. To return to the clinic Monday if not better.

## 2017-01-26 NOTE — Progress Notes (Signed)
Orthostatics on Patient Lying: pulse 93 bp 76/56 Sitting: pulse 103 bp 80/64 Standing: pulse 109 bp 66/56  Patient ID: Dustin Fletcher, male   DOB: 1961/01/05, 56 y.o.   MRN: [id number]

## 2017-03-15 DIAGNOSIS — S472XXA Crushing injury of left shoulder and upper arm, initial encounter: Secondary | ICD-10-CM | POA: Diagnosis not present

## 2017-03-26 DIAGNOSIS — L570 Actinic keratosis: Secondary | ICD-10-CM | POA: Diagnosis not present

## 2017-03-26 DIAGNOSIS — D492 Neoplasm of unspecified behavior of bone, soft tissue, and skin: Secondary | ICD-10-CM | POA: Diagnosis not present

## 2017-03-26 DIAGNOSIS — D2321 Other benign neoplasm of skin of right ear and external auricular canal: Secondary | ICD-10-CM | POA: Diagnosis not present

## 2017-03-26 DIAGNOSIS — L821 Other seborrheic keratosis: Secondary | ICD-10-CM | POA: Diagnosis not present

## 2017-05-10 DIAGNOSIS — D2239 Melanocytic nevi of other parts of face: Secondary | ICD-10-CM | POA: Diagnosis not present

## 2017-05-10 DIAGNOSIS — L720 Epidermal cyst: Secondary | ICD-10-CM | POA: Diagnosis not present

## 2017-05-10 DIAGNOSIS — L719 Rosacea, unspecified: Secondary | ICD-10-CM | POA: Diagnosis not present

## 2017-07-20 ENCOUNTER — Other Ambulatory Visit: Payer: Self-pay

## 2017-07-20 DIAGNOSIS — Z299 Encounter for prophylactic measures, unspecified: Secondary | ICD-10-CM

## 2017-07-21 LAB — COMPREHENSIVE METABOLIC PANEL
A/G RATIO: 2 (ref 1.2–2.2)
ALBUMIN: 4.5 g/dL (ref 3.5–5.5)
ALT: 22 IU/L (ref 0–44)
AST: 14 IU/L (ref 0–40)
Alkaline Phosphatase: 65 IU/L (ref 39–117)
BUN / CREAT RATIO: 21 — AB (ref 9–20)
BUN: 19 mg/dL (ref 6–24)
Bilirubin Total: 0.4 mg/dL (ref 0.0–1.2)
CALCIUM: 9.6 mg/dL (ref 8.7–10.2)
CO2: 24 mmol/L (ref 20–29)
CREATININE: 0.89 mg/dL (ref 0.76–1.27)
Chloride: 105 mmol/L (ref 96–106)
GFR, EST AFRICAN AMERICAN: 110 mL/min/{1.73_m2} (ref >59)
GFR, EST NON AFRICAN AMERICAN: 96 mL/min/{1.73_m2} (ref >59)
GLOBULIN, TOTAL: 2.3 g/dL (ref 1.5–4.5)
Glucose: 129 mg/dL — ABNORMAL HIGH (ref 65–99)
POTASSIUM: 4.6 mmol/L (ref 3.5–5.2)
SODIUM: 141 mmol/L (ref 134–144)
Total Protein: 6.8 g/dL (ref 6.0–8.5)

## 2017-07-21 LAB — CBC WITH DIFFERENTIAL/PLATELET
BASOS ABS: 0 10*3/uL (ref 0.0–0.2)
BASOS: 0 %
EOS (ABSOLUTE): 0.1 10*3/uL (ref 0.0–0.4)
Eos: 3 %
HEMOGLOBIN: 14.5 g/dL (ref 13.0–17.7)
Hematocrit: 44.3 % (ref 37.5–51.0)
IMMATURE GRANS (ABS): 0 10*3/uL (ref 0.0–0.1)
Immature Granulocytes: 0 %
LYMPHS ABS: 2 10*3/uL (ref 0.7–3.1)
LYMPHS: 39 %
MCH: 29.1 pg (ref 26.6–33.0)
MCHC: 32.7 g/dL (ref 31.5–35.7)
MCV: 89 fL (ref 79–97)
MONOCYTES: 13 %
Monocytes Absolute: 0.7 10*3/uL (ref 0.1–0.9)
NEUTROS ABS: 2.3 10*3/uL (ref 1.4–7.0)
Neutrophils: 45 %
Platelets: 208 10*3/uL (ref 150–379)
RBC: 4.98 x10E6/uL (ref 4.14–5.80)
RDW: 14.7 % (ref 12.3–15.4)
WBC: 5.2 10*3/uL (ref 3.4–10.8)

## 2017-07-21 LAB — LIPID PANEL
CHOL/HDL RATIO: 4.1 ratio (ref 0.0–5.0)
Cholesterol, Total: 128 mg/dL (ref 100–199)
HDL: 31 mg/dL — ABNORMAL LOW (ref >39)
LDL CALC: 43 mg/dL (ref 0–99)
Triglycerides: 268 mg/dL — ABNORMAL HIGH (ref 0–149)
VLDL CHOLESTEROL CAL: 54 mg/dL — AB (ref 5–40)

## 2017-07-21 LAB — TSH: TSH: 1.11 u[IU]/mL (ref 0.450–4.500)

## 2017-07-21 LAB — HEMOGLOBIN A1C
ESTIMATED AVERAGE GLUCOSE: 146 mg/dL
HEMOGLOBIN A1C: 6.7 % — AB (ref 4.8–5.6)

## 2017-07-23 ENCOUNTER — Encounter: Payer: Self-pay | Admitting: Family Medicine

## 2017-07-23 ENCOUNTER — Ambulatory Visit: Payer: BLUE CROSS/BLUE SHIELD | Admitting: Family Medicine

## 2017-07-23 VITALS — BP 124/83 | HR 74 | Temp 99.3°F | Ht 74.0 in | Wt 285.3 lb

## 2017-07-23 DIAGNOSIS — E1142 Type 2 diabetes mellitus with diabetic polyneuropathy: Secondary | ICD-10-CM | POA: Diagnosis not present

## 2017-07-23 DIAGNOSIS — Z125 Encounter for screening for malignant neoplasm of prostate: Secondary | ICD-10-CM

## 2017-07-23 DIAGNOSIS — Z23 Encounter for immunization: Secondary | ICD-10-CM

## 2017-07-23 DIAGNOSIS — Z0000 Encounter for general adult medical examination without abnormal findings: Secondary | ICD-10-CM | POA: Diagnosis not present

## 2017-07-23 DIAGNOSIS — E119 Type 2 diabetes mellitus without complications: Secondary | ICD-10-CM

## 2017-07-23 DIAGNOSIS — I10 Essential (primary) hypertension: Secondary | ICD-10-CM

## 2017-07-23 DIAGNOSIS — E7800 Pure hypercholesterolemia, unspecified: Secondary | ICD-10-CM

## 2017-07-23 DIAGNOSIS — Z1329 Encounter for screening for other suspected endocrine disorder: Secondary | ICD-10-CM | POA: Diagnosis not present

## 2017-07-23 MED ORDER — DAPAGLIFLOZIN PROPANEDIOL 10 MG PO TABS: 10.0000 mg | ORAL_TABLET | Freq: Every day | ORAL | 4 refills | Status: DC

## 2017-07-23 MED ORDER — ROSUVASTATIN CALCIUM 5 MG PO TABS: 5.0000 mg | ORAL_TABLET | Freq: Every day | ORAL | 4 refills | Status: DC

## 2017-07-23 MED ORDER — METFORMIN HCL 500 MG PO TABS: 1000.0000 mg | ORAL_TABLET | Freq: Two times a day (BID) | ORAL | 4 refills | Status: DC

## 2017-07-23 MED ORDER — MELOXICAM 15 MG PO TABS: 15.0000 mg | ORAL_TABLET | Freq: Every day | ORAL | 4 refills | Status: DC

## 2017-07-23 MED ORDER — LISINOPRIL 10 MG PO TABS: 10.0000 mg | ORAL_TABLET | Freq: Every day | ORAL | 4 refills | Status: DC

## 2017-07-23 NOTE — Assessment & Plan Note (Signed)
Patient with new onset of total numbness confirmed with nerve testing Discussed prevention of amputation and toe and foot care.

## 2017-07-23 NOTE — Assessment & Plan Note (Signed)
The current medical regimen is effective;  continue present plan and medications.  

## 2017-07-23 NOTE — Progress Notes (Signed)
BP 124/83 (BP Location: Left Arm, Patient Position: Sitting, Cuff Size: Large)   Pulse 74   Temp 99.3 F (37.4 C) (Temporal)   Ht 6\' 2"  (1.88 m)   Wt 285 lb 4.8 oz (129.4 kg)   SpO2 99%   BMI 36.63 kg/m    Subjective:    Patient ID: Dustin Fletcher, Dustin Fletcher    DOB: Apr 08, 1961, 56 y.o.   MRN: [id number]  HPI: Dustin Fletcher is a 56 y.o. Dustin Fletcher  Chief Complaint  Patient presents with  . Annual Exam  patient doing well is concerned has developed some toe numbness especially of his great toe with some cold-type  Sensation. diabetes otherwise doing well with no complaints Blood pressure also doing well without problems Only taking meloxicam during. Reviewed labs which are normal especially improved triglycerides.  Relevant past medical, surgical, family and social history reviewed and updated as indicated. Interim medical history since our last visit reviewed. Allergies and medications reviewed and updated.  Review of Systems  Constitutional: Negative.   HENT: Negative.   Eyes: Negative.   Respiratory: Negative.   Cardiovascular: Negative.   Gastrointestinal: Negative.   Endocrine: Negative.   Genitourinary: Negative.   Musculoskeletal: Negative.   Skin: Negative.   Allergic/Immunologic: Negative.   Neurological: Negative.   Hematological: Negative.   Psychiatric/Behavioral: Negative.     Per HPI unless specifically indicated above     Objective:    BP 124/83 (BP Location: Left Arm, Patient Position: Sitting, Cuff Size: Large)   Pulse 74   Temp 99.3 F (37.4 C) (Temporal)   Ht 6\' 2"  (1.88 m)   Wt 285 lb 4.8 oz (129.4 kg)   SpO2 99%   BMI 36.63 kg/m   Wt Readings from Last 3 Encounters:  07/23/17 285 lb 4.8 oz (129.4 kg)  01/26/17 280 lb (127 kg)  01/25/17 286 lb (129.7 kg)    Physical Exam  Constitutional: He is oriented to person, place, and time. He appears well-developed and well-nourished.  HENT:  Head: Normocephalic and atraumatic.  Right Ear:  External ear normal.  Left Ear: External ear normal.  Eyes: Conjunctivae and EOM are normal. Pupils are equal, round, and reactive to light.  Neck: Normal range of motion. Neck supple.  Cardiovascular: Normal rate, regular rhythm, normal heart sounds and intact distal pulses.  Pulmonary/Chest: Effort normal and breath sounds normal.  Abdominal: Soft. Bowel sounds are normal. There is no splenomegaly or hepatomegaly.  Genitourinary: Rectum normal, prostate normal and penis normal.  Musculoskeletal: Normal range of motion.  Neurological: He is alert and oriented to person, place, and time. He has normal reflexes.  Skin: No rash noted. No erythema.  Psychiatric: He has a normal mood and affect. His behavior is normal. Judgment and thought content normal.    Results for orders placed or performed in visit on 07/20/17  CBC w/Diff  Result Value Ref Range   WBC 5.2 3.4 - 10.8 x10E3/uL   RBC 4.98 4.14 - 5.80 x10E6/uL   Hemoglobin 14.5 13.0 - 17.7 g/dL   Hematocrit 40.944.3 81.137.5 - 51.0 %   MCV 89 79 - 97 fL   MCH 29.1 26.6 - 33.0 pg   MCHC 32.7 31.5 - 35.7 g/dL   RDW 91.414.7 78.212.3 - 95.615.4 %   Platelets 208 150 - 379 x10E3/uL   Neutrophils 45 Not Estab. %   Lymphs 39 Not Estab. %   Monocytes 13 Not Estab. %   Eos 3 Not Estab. %  Basos 0 Not Estab. %   Neutrophils Absolute 2.3 1.4 - 7.0 x10E3/uL   Lymphocytes Absolute 2.0 0.7 - 3.1 x10E3/uL   Monocytes Absolute 0.7 0.1 - 0.9 x10E3/uL   EOS (ABSOLUTE) 0.1 0.0 - 0.4 x10E3/uL   Basophils Absolute 0.0 0.0 - 0.2 x10E3/uL   Immature Granulocytes 0 Not Estab. %   Immature Grans (Abs) 0.0 0.0 - 0.1 x10E3/uL  Comprehensive metabolic panel  Result Value Ref Range   Glucose 129 (H) 65 - 99 mg/dL   BUN 19 6 - 24 mg/dL   Creatinine, Ser 4.09 0.76 - 1.27 mg/dL   GFR calc non Af Amer 96 >59 mL/min/1.73   GFR calc Af Amer 110 >59 mL/min/1.73   BUN/Creatinine Ratio 21 (H) 9 - 20   Sodium 141 134 - 144 mmol/L   Potassium 4.6 3.5 - 5.2 mmol/L   Chloride  105 96 - 106 mmol/L   CO2 24 20 - 29 mmol/L   Calcium 9.6 8.7 - 10.2 mg/dL   Total Protein 6.8 6.0 - 8.5 g/dL   Albumin 4.5 3.5 - 5.5 g/dL   Globulin, Total 2.3 1.5 - 4.5 g/dL   Albumin/Globulin Ratio 2.0 1.2 - 2.2   Bilirubin Total 0.4 0.0 - 1.2 mg/dL   Alkaline Phosphatase 65 39 - 117 IU/L   AST 14 0 - 40 IU/L   ALT 22 0 - 44 IU/L  TSH  Result Value Ref Range   TSH 1.110 0.450 - 4.500 uIU/mL  Lipid Profile  Result Value Ref Range   Cholesterol, Total 128 100 - 199 mg/dL   Triglycerides 811 (H) 0 - 149 mg/dL   HDL 31 (L) >91 mg/dL   VLDL Cholesterol Cal 54 (H) 5 - 40 mg/dL   LDL Calculated 43 0 - 99 mg/dL   Chol/HDL Ratio 4.1 0.0 - 5.0 ratio  HgB A1c  Result Value Ref Range   Hgb A1c MFr Bld 6.7 (H) 4.8 - 5.6 %   Est. average glucose Bld gHb Est-mCnc 146 mg/dL      Assessment & Plan:   Problem List Items Addressed This Visit      Cardiovascular and Mediastinum   Hypertension - Primary    The current medical regimen is effective;  continue present plan and medications.       Relevant Medications   rosuvastatin (CRESTOR) 5 MG tablet   lisinopril (PRINIVIL,ZESTRIL) 10 MG tablet     Endocrine   Diabetes mellitus without complication (HCC)   Relevant Medications   metFORMIN (GLUCOPHAGE) 500 MG tablet   dapagliflozin propanediol ([name]) 10 MG TABS tablet   rosuvastatin (CRESTOR) 5 MG tablet   lisinopril (PRINIVIL,ZESTRIL) 10 MG tablet   Diabetic peripheral neuropathy (HCC)    Patient with new onset of total numbness confirmed with nerve testing Discussed prevention of amputation and toe and foot care.      Relevant Medications   metFORMIN (GLUCOPHAGE) 500 MG tablet   dapagliflozin propanediol ([name]) 10 MG TABS tablet   rosuvastatin (CRESTOR) 5 MG tablet   lisinopril (PRINIVIL,ZESTRIL) 10 MG tablet     Other   Hyperlipidemia    The current medical regimen is effective;  continue present plan and medications.       Relevant Medications   rosuvastatin  (CRESTOR) 5 MG tablet   lisinopril (PRINIVIL,ZESTRIL) 10 MG tablet    Other Visit Diagnoses    Needs flu shot       Prostate cancer screening       Thyroid disorder  screen           Follow up plan: Return in about 6 months (around 01/20/2018) for Hemoglobin A1c, BMP,  Lipids, ALT, AST.

## 2017-07-24 DIAGNOSIS — L711 Rhinophyma: Secondary | ICD-10-CM | POA: Diagnosis not present

## 2017-10-08 ENCOUNTER — Ambulatory Visit: Payer: Self-pay | Admitting: Medical

## 2017-10-08 VITALS — BP 112/70 | HR 92 | Temp 97.5°F | Resp 18 | Wt 290.0 lb

## 2017-10-08 DIAGNOSIS — M545 Low back pain, unspecified: Secondary | ICD-10-CM

## 2017-10-08 DIAGNOSIS — M62838 Other muscle spasm: Secondary | ICD-10-CM

## 2017-10-08 MED ORDER — PREDNISONE 20 MG PO TABS: ORAL_TABLET | ORAL | 0 refills | Status: DC

## 2017-10-08 MED ORDER — CYCLOBENZAPRINE HCL 5 MG PO TABS
ORAL_TABLET | ORAL | 1 refills | Status: DC
Start: 2017-10-08 — End: 2018-07-17

## 2017-10-08 NOTE — Patient Instructions (Addendum)
Do not take Aleve and Ibuprofen together. Apply heat and ice to the area.  Back Pain, Adult Back pain is very common. The pain often gets better over time. The cause of back pain is usually not dangerous. Most people can learn to manage their back pain on their own. Follow these instructions at home: Watch your back pain for any changes. The following actions may help to lessen any pain you are feeling:  Stay active. Start with short walks on flat ground if you can. Try to walk farther each day.  Exercise regularly as told by your doctor. Exercise helps your back heal faster. It also helps avoid future injury by keeping your muscles strong and flexible.  Do not sit, drive, or stand in one place for more than 30 minutes.  Do not stay in bed. Resting more than 1-2 days can slow down your recovery.  Be careful when you bend or lift an object. Use good form when lifting: ? Bend at your knees. ? Keep the object close to your body. ? Do not twist.  Sleep on a firm mattress. Lie on your side, and bend your knees. If you lie on your back, put a pillow under your knees.  Take medicines only as told by your doctor.  Put ice on the injured area. ? Put ice in a plastic bag. ? Place a towel between your skin and the bag. ? Leave the ice on for 20 minutes, 2-3 times a day for the first 2-3 days. After that, you can switch between ice and heat packs.  Avoid feeling anxious or stressed. Find good ways to deal with stress, such as exercise.  Maintain a healthy weight. Extra weight puts stress on your back.  Contact a doctor if:  You have pain that does not go away with rest or medicine.  You have worsening pain that goes down into your legs or buttocks.  You have pain that does not get better in one week.  You have pain at night.  You lose weight.  You have a fever or chills. Get help right away if:  You cannot control when you poop (bowel movement) or pee (urinate).  Your arms or  legs feel weak.  Your arms or legs lose feeling (numbness).  You feel sick to your stomach (nauseous) or throw up (vomit).  You have belly (abdominal) pain.  You feel like you may pass out (faint). This information is not intended to replace advice given to you by your health care provider. Make sure you discuss any questions you have with your health care provider. Document Released: 02/14/2008 Document Revised: 02/03/2016 Document Reviewed: 12/30/2013 Elsevier Interactive Patient Education  [employer]2018 Elsevier Inc.

## 2017-10-08 NOTE — Progress Notes (Signed)
Subjective:    Patient ID: Dustin Fletcher, male    DOB: December 02, 1960, 57 y.o.   MRN: [id number]  HPI 56 non acute distress ,  2 weeks ago got up pain getting up from the bed , came to work felt fine 6 am -9am had pain that felt pain located in the middle back, hit a pothole on the way to work,  not sure if that caused  It. Took Aleve and Ibuprofen  Twice daily did help with pain., Getting better last week.   . Denies any specific injury.On Thursday or Friday felt pressure lower back pain and radiated to the front. Seemed to be improving. Satuday bending over to get a cord and had right side pain, felt a pull in lowr back in the center. Since Saturday afternoon moved to the right side. Denies numbness or tingling, no loss of bowel or bladder or saddle numbness or tingling, Denies leg weakness..Rates pain 5/ 10 this morning , 3-4 /10 in room right now. Denies pain going down either leg.  Dull pain on the right side of lower back,  On and off pain. More pain with initially getting up this morning. Stiffness with sitting and trying to get up.  Ibpurofen 800 mg this morning. Did have breakfast.   Had been using heated seats in car which helped.  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 Bending a lot.    Review of Systems  Constitutional: Negative for chills and fever.  HENT: Negative for congestion, ear discharge and sore throat.   Respiratory: Negative for cough and shortness of breath.   Cardiovascular: Negative for chest pain.  Gastrointestinal: Negative for abdominal pain.  Genitourinary: Positive for frequency (on Farxirga). Negative for dysuria and urgency.  Musculoskeletal: Positive for back pain. Negative for gait problem.  Skin: Negative for rash.  Neurological: Negative for dizziness, syncope, weakness, light-headedness and numbness.  Psychiatric/Behavioral: Negative for behavioral problems, self-injury and suicidal ideas. The patient is not nervous/anxious.         Objective:   Physical Exam  Constitutional: He is oriented to person, place, and time. He appears well-developed and well-nourished.  Eyes: Conjunctivae and EOM are normal. Pupils are equal, round, and reactive to light.  Neurological: He is alert and oriented to person, place, and time.  Skin: Skin is warm and dry. No rash noted.  Psychiatric: He has a normal mood and affect. His behavior is normal. Judgment and thought content normal.  Nursing note and vitals reviewed.    No vertebral or paravertebral tenderness with palpation. 5/5 leg strength , negative straight leg. 2+ popliteal reflexes bilaterally    Assessment & Plan:  Back pain right side.  Activity as tolerated. Work note given with restrictions. Try avoid bending. and lift more than 20lbs, squatting or twisting. Return in 1 week for follow up appointment.  . Meds ordered this encounter  Medications  . cyclobenzaprine (FLEXERIL) 5 MG tablet    Sig: Take  1-2 at bedtime. Will cause sleepiness.    Dispense:  20 tablet    Refill:  1  . predniSONE (DELTASONE) 20 MG tablet    Sig: Take 2 tablets by mouth once a day for  3-4 days.    Dispense:  8 tablet    Refill:  0    prednisone burst prescribed., heat and ice to the area.  Not to take  Ibpuorfen and aleve together in same day.Always take either with food. Patient verbalizes  understanding and has no questions at discharge.

## 2017-10-16 ENCOUNTER — Encounter: Payer: Self-pay | Admitting: Medical

## 2017-10-16 ENCOUNTER — Ambulatory Visit: Payer: Self-pay | Admitting: Medical

## 2017-10-16 VITALS — BP 121/62 | HR 104 | Temp 97.9°F | Resp 18 | Ht 74.0 in | Wt 285.0 lb

## 2017-10-16 DIAGNOSIS — M545 Low back pain, unspecified: Secondary | ICD-10-CM

## 2017-10-16 NOTE — Progress Notes (Signed)
   Subjective:    Patient ID: Dustin Fletcher, male    DOB: [birth year], 57 y.o.   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 Saturday went to get out of bed had  lower mid back in bed and getting up out of bed. Rested on Saturday . Took medications as prescribed ( finished burst jof prednisone was taking flexeril 5 mg 1-2 every 8 hours). Sunday got up with sharp right sided back pain. Had funny  feeling  in right testicle last  1-2 seconds with walking , no change in urine , no blood seen. No history of kidney stones. Sunday night with hurting  With throbbing pain in back.  No swelling of the testicle,  No tenderness with self exam. Took Monday off due to back pain.  Today just slight pain located in the center of the lower back, feels stiff. Once he gets moving it eases up. Sitting increases pain when trying to get out of bed..  No pain down the legs or buttocks, denies saddle numbness or tingling.   Review of Systems No leg weakness, numbness or tingling down the legs.   could not tell a difference with prednisone. Able to sit with legs crossed in room with ankle over knee.   Today picked up  45-50 lbs bags. Patient chose to do this lifting.  Pain better then it was on Sunday. Sometimes gets a sharp pain with certain movements but not all the time. Had it one time yesterday, none today.  No loss of bowel or bladder spontaneously. Objective:   Physical Exam  Constitutional: He is oriented to person, place, and time. He appears well-developed and well-nourished.  HENT:  Head: Normocephalic and atraumatic.  Eyes: Conjunctivae and EOM are normal. Pupils are equal, round, and reactive to light.  Neurological: He is alert and oriented to person, place, and time.  Skin: Skin is warm and dry.  Psychiatric: He has a normal mood and affect. His behavior is  normal. Judgment and thought content normal.  Nursing note and vitals reviewed.    No pain with palpation of vertebrae Patient with 5/5 strength on flexion and extension, able to dorsiflex ankels. Negative straight leg raise. 2+ popliteal reflexes. Gait wnl Sitting in chair easily gets up and walks full weight bearing ot exam table easily gets up and on exam table. Easily gets off the exam table gait within normal limits.    Assessment & Plan:  Low back pain central Patient does not want x-ray or MRI this week.  No light duty at his work even though given 20lb restriction note for work and to avoid twisting and sqautting..  Working this weekend.  Wants to finish out the week and then possibly schedule tests next week if not better. Offered work note with restrictions but patient declined. Given precautions of when to return immediately.  Still doing work at home , hung light at home single light bulb. Did not need to use ladder, " but not much work at home"  deeclines testicle exam.  Patient verbalizes understanding and has no questions at discharge.

## 2018-01-08 ENCOUNTER — Other Ambulatory Visit: Payer: Self-pay

## 2018-01-08 DIAGNOSIS — Z789 Other specified health status: Secondary | ICD-10-CM

## 2018-01-08 DIAGNOSIS — E7849 Other hyperlipidemia: Secondary | ICD-10-CM

## 2018-01-08 DIAGNOSIS — E119 Type 2 diabetes mellitus without complications: Secondary | ICD-10-CM

## 2018-01-09 LAB — ALT: ALT: 15 IU/L (ref 0–44)

## 2018-01-09 LAB — BASIC METABOLIC PANEL
BUN/Creatinine Ratio: 23 — ABNORMAL HIGH (ref 9–20)
BUN: 20 mg/dL (ref 6–24)
CALCIUM: 9.3 mg/dL (ref 8.7–10.2)
CO2: 20 mmol/L (ref 20–29)
CREATININE: 0.86 mg/dL (ref 0.76–1.27)
Chloride: 107 mmol/L — ABNORMAL HIGH (ref 96–106)
GFR calc Af Amer: 112 mL/min/{1.73_m2} (ref >59)
GFR calc non Af Amer: 97 mL/min/{1.73_m2} (ref >59)
GLUCOSE: 135 mg/dL — AB (ref 65–99)
POTASSIUM: 4.5 mmol/L (ref 3.5–5.2)
SODIUM: 142 mmol/L (ref 134–144)

## 2018-01-09 LAB — LIPID PANEL
CHOL/HDL RATIO: 3.9 ratio (ref 0.0–5.0)
Cholesterol, Total: 131 mg/dL (ref 100–199)
HDL: 34 mg/dL — AB (ref >39)
LDL CALC: 36 mg/dL (ref 0–99)
Triglycerides: 303 mg/dL — ABNORMAL HIGH (ref 0–149)
VLDL CHOLESTEROL CAL: 61 mg/dL — AB (ref 5–40)

## 2018-01-09 LAB — HGB A1C W/O EAG: HEMOGLOBIN A1C: 7 % — AB (ref 4.8–5.6)

## 2018-01-09 LAB — AST: AST: 16 IU/L (ref 0–40)

## 2018-01-10 ENCOUNTER — Ambulatory Visit (INDEPENDENT_AMBULATORY_CARE_PROVIDER_SITE_OTHER): Payer: BLUE CROSS/BLUE SHIELD | Admitting: Family Medicine

## 2018-01-10 ENCOUNTER — Encounter: Payer: Self-pay | Admitting: Family Medicine

## 2018-01-10 VITALS — BP 120/76 | HR 85 | Ht 74.0 in | Wt 284.0 lb

## 2018-01-10 DIAGNOSIS — E119 Type 2 diabetes mellitus without complications: Secondary | ICD-10-CM

## 2018-01-10 DIAGNOSIS — E785 Hyperlipidemia, unspecified: Secondary | ICD-10-CM | POA: Diagnosis not present

## 2018-01-10 DIAGNOSIS — I10 Essential (primary) hypertension: Secondary | ICD-10-CM | POA: Diagnosis not present

## 2018-01-10 NOTE — Assessment & Plan Note (Signed)
The current medical regimen is effective;  continue present plan and medications.  

## 2018-01-10 NOTE — Progress Notes (Signed)
BP 120/76   Pulse 85   Ht  (1.88 m)   Wt 284 lb (128.8 kg)   SpO2 96%   BMI 36.46 kg/m    Subjective:    Patient ID: Dustin Fletcher, male    DOB: [birth year], 57 y.o.   MRN: [id number]  HPI: Dustin Fletcher is a 57 y.o. male  Chief Complaint  Patient presents with  . Follow-up  . Hypertension  . Hyperlipidemia  . Diabetes  Patient follow-up all in all doing well with diabetes hypercholesterol and blood pressure.  No issues with medications.  Reviewed labs which are normal and doing well.  Hemoglobin A1c of 7. 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  Has been ongoing a couple of weeks.  Does take meloxicam on a regular basis for his knee.  Relevant past medical, surgical, family and social history reviewed and updated as indicated. Interim medical history since our last visit reviewed. Allergies and medications reviewed and updated.  Review of Systems  Constitutional: Negative.   Respiratory: Negative.   Cardiovascular: Negative.     Per HPI unless specifically indicated above     Objective:    BP 120/76   Pulse 85   Ht  (1.88 m)   Wt 284 lb (128.8 kg)   SpO2 96%   BMI 36.46 kg/m   Wt Readings from Last 3 Encounters:  01/10/18 284 lb (128.8 kg)  10/16/17 285 lb (129.3 kg)  10/08/17 290 lb (131.5 kg)    Physical Exam  Constitutional: He is oriented to person, place, and time. He appears well-developed and well-nourished.  HENT:  Head: Normocephalic and atraumatic.  Eyes: Conjunctivae and EOM are normal.  Neck: Normal range of motion.  Cardiovascular: Normal rate, regular rhythm and normal heart sounds.  Pulmonary/Chest: Effort normal and breath sounds normal.  Musculoskeletal: Normal range of motion.  Right anterior shin muscle with some tenderness  Neurological: He is alert and oriented to person, place, and time.  Skin: No erythema.    Psychiatric: He has a normal mood and affect. His behavior is normal. Judgment and thought content normal.    Results for orders placed or performed in visit on 01/08/18  AST  Result Value Ref Range   AST 16 0 - 40 IU/L  ALT  Result Value Ref Range   ALT 15 0 - 44 IU/L  Lipid panel  Result Value Ref Range   Cholesterol, Total 131 100 - 199 mg/dL   Triglycerides 696 (H) 0 - 149 mg/dL   HDL 34 (L) >29 mg/dL   VLDL Cholesterol Cal 61 (H) 5 - 40 mg/dL   LDL Calculated 36 0 - 99 mg/dL   Chol/HDL Ratio 3.9 0.0 - 5.0 ratio  Hgb A1c w/o eAG  Result Value Ref Range   Hgb A1c MFr Bld 7.0 (H) 4.8 - 5.6 %  Basic metabolic panel  Result Value Ref Range   Glucose 135 (H) 65 - 99 mg/dL   BUN 20 6 - 24 mg/dL   Creatinine, Ser 5.28 0.76 - 1.27 mg/dL   GFR calc non Af Amer 97 >59 mL/min/1.73   GFR calc Af Amer 112 >59 mL/min/1.73   BUN/Creatinine Ratio 23 (H) 9 - 20   Sodium 142 134 - 144 mmol/L   Potassium 4.5 3.5 - 5.2 mmol/L   Chloride 107 (H) 96 - 106 mmol/L   CO2  20 20 - 29 mmol/L   Calcium 9.3 8.7 - 10.2 mg/dL      Assessment & Plan:   Problem List Items Addressed This Visit      Cardiovascular and Mediastinum   Hypertension - Primary    The current medical regimen is effective;  continue present plan and medications.       Relevant Orders   Basic metabolic panel   Bayer DCA Hb A5W Waived   LP+ALT+AST [place], [place]     Endocrine   Diabetes mellitus without complication (HCC)    The current medical regimen is effective;  continue present plan and medications.       Relevant Orders   Basic metabolic panel   Bayer DCA Hb U9W Waived   LP+ALT+AST [place], Waived     Other   Hyperlipidemia    The current medical regimen is effective;  continue present plan and medications.       Relevant Orders   Basic metabolic panel   Bayer DCA Hb J1B Waived   LP+ALT+AST [place], Waived       Follow up plan: Return in about 3 months (around 04/12/2018) for Hemoglobin  A1c.

## 2018-03-29 DIAGNOSIS — H02051 Trichiasis without entropian right upper eyelid: Secondary | ICD-10-CM | POA: Diagnosis not present

## 2018-03-29 DIAGNOSIS — H2511 Age-related nuclear cataract, right eye: Secondary | ICD-10-CM | POA: Diagnosis not present

## 2018-03-29 LAB — HM DIABETES EYE EXAM

## 2018-04-16 ENCOUNTER — Other Ambulatory Visit: Payer: Self-pay

## 2018-04-17 ENCOUNTER — Ambulatory Visit: Payer: BLUE CROSS/BLUE SHIELD | Admitting: Family Medicine

## 2018-04-22 ENCOUNTER — Other Ambulatory Visit: Payer: Self-pay

## 2018-04-22 DIAGNOSIS — E785 Hyperlipidemia, unspecified: Secondary | ICD-10-CM

## 2018-04-22 DIAGNOSIS — I10 Essential (primary) hypertension: Secondary | ICD-10-CM

## 2018-04-22 DIAGNOSIS — E119 Type 2 diabetes mellitus without complications: Secondary | ICD-10-CM

## 2018-04-23 ENCOUNTER — Other Ambulatory Visit: Payer: Self-pay

## 2018-04-23 DIAGNOSIS — E119 Type 2 diabetes mellitus without complications: Secondary | ICD-10-CM

## 2018-04-23 DIAGNOSIS — E785 Hyperlipidemia, unspecified: Secondary | ICD-10-CM

## 2018-04-23 DIAGNOSIS — I10 Essential (primary) hypertension: Secondary | ICD-10-CM

## 2018-04-24 ENCOUNTER — Other Ambulatory Visit: Payer: Self-pay | Admitting: Family Medicine

## 2018-04-24 NOTE — Progress Notes (Signed)
farxi  

## 2018-04-30 ENCOUNTER — Encounter: Payer: Self-pay | Admitting: Family Medicine

## 2018-04-30 LAB — SPECIMEN STATUS REPORT

## 2018-04-30 LAB — BASIC METABOLIC PANEL
BUN / CREAT RATIO: 17 (ref 9–20)
BUN: 16 mg/dL (ref 6–24)
CHLORIDE: 105 mmol/L (ref 96–106)
CO2: 18 mmol/L — AB (ref 20–29)
Calcium: 9.4 mg/dL (ref 8.7–10.2)
Creatinine, Ser: 0.95 mg/dL (ref 0.76–1.27)
GFR calc Af Amer: 102 mL/min/{1.73_m2} (ref >59)
GFR calc non Af Amer: 88 mL/min/{1.73_m2} (ref >59)
GLUCOSE: 151 mg/dL — AB (ref 65–99)
POTASSIUM: 4.6 mmol/L (ref 3.5–5.2)
SODIUM: 140 mmol/L (ref 134–144)

## 2018-04-30 LAB — HGB A1C W/O EAG: HEMOGLOBIN A1C: 7.3 % — AB (ref 4.8–5.6)

## 2018-05-01 LAB — LIPID PANEL
Chol/HDL Ratio: 3.8 ratio (ref 0.0–5.0)
Cholesterol, Total: 123 mg/dL (ref 100–199)
HDL: 32 mg/dL — AB (ref >39)
LDL Calculated: 45 mg/dL (ref 0–99)
Triglycerides: 228 mg/dL — ABNORMAL HIGH (ref 0–149)
VLDL Cholesterol Cal: 46 mg/dL — ABNORMAL HIGH (ref 5–40)

## 2018-05-01 LAB — ALT: ALT: 18 IU/L (ref 0–44)

## 2018-05-01 LAB — AST: AST: 16 IU/L (ref 0–40)

## 2018-05-01 LAB — SPECIMEN STATUS REPORT

## 2018-07-17 ENCOUNTER — Ambulatory Visit: Payer: Self-pay | Admitting: Medical

## 2018-07-17 ENCOUNTER — Encounter: Payer: Self-pay | Admitting: Medical

## 2018-07-17 VITALS — BP 125/78 | HR 96 | Temp 97.0°F | Resp 18 | Wt 283.4 lb

## 2018-07-17 DIAGNOSIS — M1711 Unilateral primary osteoarthritis, right knee: Secondary | ICD-10-CM | POA: Diagnosis not present

## 2018-07-17 DIAGNOSIS — G8929 Other chronic pain: Secondary | ICD-10-CM

## 2018-07-17 DIAGNOSIS — M25561 Pain in right knee: Principal | ICD-10-CM

## 2018-07-17 NOTE — Progress Notes (Signed)
Subjective:    Patient ID: Dustin Fletcher, male    DOB: 09-Feb-1961, 57 y.o.   MRN: [id number]  HPI 57 yo male in non acute distress comes into today with complaints of pain in  Lateral side of right knee on the side .down midline to the foot and to the bottom of the foot x 6 months maybe even longer. He did mention it to his doctor who thought it may be tendonitis.  Seeing family doctor for refill on Mobic and labs on 07/29/18. Was suppose to see his doctor 6 months ago, but lost lab prescription , had to cancel appointment.  Rides mower  3 times a week and this is his clutch foot.  Taking Aleve PM at night to help him sleep. Trouble pushing on break pedal due to sharp pain in the lateral side knee. Pain on the right side of his right  foot.  Describes it as  Like when you hit your funny bone and then a sharp pain. 5-6/10, throbbing , Worse yesterday, but better today.  Out of Mobic "for a while" cannot recall when he had it last.   Wearing worn out shoes, has better shoes at home and when wearing them yesterday the food did not hurt.   "After sitting for 15 min tries to get up and then when getting up feels like it is going to give out. "   Review of Systems  Constitutional: Negative for chills and fatigue.  HENT: Negative for congestion, ear pain and sore throat.   Eyes: Negative for discharge and itching.  Respiratory: Negative for cough and shortness of breath.   Cardiovascular: Negative for chest pain.  Gastrointestinal: Negative for anal bleeding.  Endocrine: Positive for polyuria (on [name]). Negative for polydipsia and polyphagia.  Genitourinary: Negative for dysuria.  Musculoskeletal: Positive for gait problem and joint swelling.  Skin: Negative for rash.  Allergic/Immunologic: Negative for environmental allergies.  Neurological: Negative for dizziness, syncope and light-headedness.  Hematological: Negative for adenopathy.  Psychiatric/Behavioral: Negative for  behavioral problems, self-injury and suicidal ideas.        Objective:   Physical Exam  Constitutional: He is oriented to person, place, and time. He appears well-developed and well-nourished.  HENT:  Head: Normocephalic and atraumatic.  Eyes: Pupils are equal, round, and reactive to light. EOM are normal.  Musculoskeletal:  See HPI and physical exam for right knee.  Neurological: He is alert and oriented to person, place, and time.  Skin: Skin is warm and dry.  Psychiatric: He has a normal mood and affect. His behavior is normal. Judgment and thought content normal.  Nursing note and vitals reviewed.   Full weight bearing . Swelling at right knee compared to left knee., FROM , 1-2 +,  No Homans sign, 1-2 +. popliteal , DP,PT pulses., < 2 cap refill.Trouble getting shoes and socks onto right foot due to swelling  And bending his knee onto the left leg,he gets a  sharp pain inferior to the patella ( laterally to midline of knee).. Pain on posterior drawers test " I can feel it".       Assessment & Plan:  Right Knee  Patient does not want to do anything today I .Recommended he go to the walk- in clinic at 2 pm at [facility] [place], address, phone number and directions given to patient.  He  Says he  has lunch at  2 and will try to get to the clinic. ( the clinic is  open to 7:30pm , called and verified). His job is physical and I reviewed with patient I am worried he could fall if his knee gives out. Offered crutches but he declined.  Offered work note but he declines. He verbalizes understanding the importance of further evaluation and follow up and has no questions at discharge.  Did discuss ice/elevation. with patient to the area.

## 2018-07-23 ENCOUNTER — Other Ambulatory Visit: Payer: Self-pay

## 2018-07-23 ENCOUNTER — Telehealth: Payer: Self-pay

## 2018-07-23 DIAGNOSIS — E119 Type 2 diabetes mellitus without complications: Secondary | ICD-10-CM

## 2018-07-23 NOTE — Telephone Encounter (Signed)
Called and spoke to DillonKathy. She states that they already got orders from Dr. Dossie Arbourrissman and just wanted clarification on the orders. BMP, Lipid panel, and A1C.

## 2018-07-23 NOTE — Telephone Encounter (Signed)
Orders written on Rx pad

## 2018-07-23 NOTE — Telephone Encounter (Signed)
Copied from CRM [id number]. Topic: General - Inquiry >> Jul 18, 2018  9:58 AM Windy KalataMichael, Taylor L, NT wrote: Reason for CRM: patient is calling and states he lost his script for lab results and is supposed to get them in 2 weeks. He states he needs it faxed to his work. Also states he is supposed to get the "full" lab work.  Fax# [employer] [facility] office  # [phone] >> Jul 23, 2018 10:13 AM Leafy Roobinson, Norma J wrote: Olegario MessierKathy  needs clarification on lab orders please call her today by 4 pm if no the order will be cancelled   Routing to provider, Dr. Shela CommonsJ, do you know what labs I can order? I dont see anything in the chart.

## 2018-07-24 ENCOUNTER — Other Ambulatory Visit: Payer: Self-pay

## 2018-07-24 DIAGNOSIS — E119 Type 2 diabetes mellitus without complications: Secondary | ICD-10-CM

## 2018-07-25 ENCOUNTER — Encounter: Payer: Self-pay | Admitting: Family Medicine

## 2018-07-25 LAB — BASIC METABOLIC PANEL
BUN/Creatinine Ratio: 18 (ref 9–20)
BUN: 17 mg/dL (ref 6–24)
CALCIUM: 9.3 mg/dL (ref 8.7–10.2)
CHLORIDE: 107 mmol/L — AB (ref 96–106)
CO2: 21 mmol/L (ref 20–29)
Creatinine, Ser: 0.95 mg/dL (ref 0.76–1.27)
GFR calc Af Amer: 102 mL/min/{1.73_m2} (ref >59)
GFR calc non Af Amer: 88 mL/min/{1.73_m2} (ref >59)
GLUCOSE: 125 mg/dL — AB (ref 65–99)
Potassium: 4.8 mmol/L (ref 3.5–5.2)
Sodium: 142 mmol/L (ref 134–144)

## 2018-07-25 LAB — HGB A1C W/O EAG: Hgb A1c MFr Bld: 6.9 % — ABNORMAL HIGH (ref 4.8–5.6)

## 2018-07-25 LAB — LIPID PANEL
CHOL/HDL RATIO: 3.9 ratio (ref 0.0–5.0)
Cholesterol, Total: 130 mg/dL (ref 100–199)
HDL: 33 mg/dL — ABNORMAL LOW (ref >39)
LDL Calculated: 41 mg/dL (ref 0–99)
Triglycerides: 278 mg/dL — ABNORMAL HIGH (ref 0–149)
VLDL Cholesterol Cal: 56 mg/dL — ABNORMAL HIGH (ref 5–40)

## 2018-07-29 ENCOUNTER — Ambulatory Visit (INDEPENDENT_AMBULATORY_CARE_PROVIDER_SITE_OTHER): Payer: BLUE CROSS/BLUE SHIELD | Admitting: Family Medicine

## 2018-07-29 ENCOUNTER — Encounter: Payer: Self-pay | Admitting: Family Medicine

## 2018-07-29 DIAGNOSIS — E7800 Pure hypercholesterolemia, unspecified: Secondary | ICD-10-CM

## 2018-07-29 DIAGNOSIS — E119 Type 2 diabetes mellitus without complications: Secondary | ICD-10-CM

## 2018-07-29 DIAGNOSIS — I10 Essential (primary) hypertension: Secondary | ICD-10-CM

## 2018-07-29 MED ORDER — DAPAGLIFLOZIN PROPANEDIOL 10 MG PO TABS: 10.0000 mg | ORAL_TABLET | Freq: Every day | ORAL | 4 refills | Status: DC

## 2018-07-29 MED ORDER — MELOXICAM 15 MG PO TABS: 15.0000 mg | ORAL_TABLET | Freq: Every day | ORAL | 4 refills | Status: DC

## 2018-07-29 MED ORDER — LISINOPRIL 10 MG PO TABS: 10.0000 mg | ORAL_TABLET | Freq: Every day | ORAL | 4 refills | Status: DC

## 2018-07-29 MED ORDER — ROSUVASTATIN CALCIUM 5 MG PO TABS: 5.0000 mg | ORAL_TABLET | Freq: Every day | ORAL | 4 refills | Status: DC

## 2018-07-29 MED ORDER — METFORMIN HCL 500 MG PO TABS: 1000.0000 mg | ORAL_TABLET | Freq: Two times a day (BID) | ORAL | 4 refills | Status: DC

## 2018-07-29 NOTE — Progress Notes (Signed)
BP 122/81   Pulse 96   Temp 98.2 F (36.8 C) (Oral)   Wt 288 lb (130.6 kg)   SpO2 98%   BMI 36.98 kg/m    Subjective:    Patient ID: Dustin Fletcher, male    DOB: 1960-09-21, 57 y.o.   MRN: [id number]  HPI: Dustin Fletcher is a 57 y.o. male  Chief Complaint  Patient presents with  . Follow-up  . Knee Pain    Right. Worsening.    Patient with long-term right knee pain.  Is worked with orthopedics and had injections.  Steroid injections have not really helped.  Patient has taken meloxicam which may be helped some but is run out will need a refill. Patient concerned about ultimate outcome of diminished cartilage showing on his knee x-rays with possibility of knee replacement surgery in the not too distant future.   Relevant past medical, surgical, family and social history reviewed and updated as indicated. Interim medical history since our last visit reviewed. Allergies and medications reviewed and updated.  Review of Systems  Constitutional: Negative.   Respiratory: Negative.   Cardiovascular: Negative.     Per HPI unless specifically indicated above     Objective:    BP 122/81   Pulse 96   Temp 98.2 F (36.8 C) (Oral)   Wt 288 lb (130.6 kg)   SpO2 98%   BMI 36.98 kg/m   Wt Readings from Last 3 Encounters:  07/29/18 288 lb (130.6 kg)  07/17/18 283 lb 6.4 oz (128.5 kg)  01/10/18 284 lb (128.8 kg)    Physical Exam  Constitutional: He is oriented to person, place, and time. He appears well-developed and well-nourished.  HENT:  Head: Normocephalic and atraumatic.  Eyes: Conjunctivae and EOM are normal.  Neck: Normal range of motion.  Cardiovascular: Normal rate, regular rhythm and normal heart sounds.  Pulmonary/Chest: Effort normal and breath sounds normal.  Musculoskeletal: Normal range of motion.  Neurological: He is alert and oriented to person, place, and time.  Skin: No erythema.  Psychiatric: He has a normal mood and affect. His behavior is  normal. Judgment and thought content normal.    Results for orders placed or performed in visit on 07/24/18  Hgb A1c w/o eAG  Result Value Ref Range   Hgb A1c MFr Bld 6.9 (H) 4.8 - 5.6 %  Lipid panel  Result Value Ref Range   Cholesterol, Total 130 100 - 199 mg/dL   Triglycerides 865278 (H) 0 - 149 mg/dL   HDL 33 (L) >78>39 mg/dL   VLDL Cholesterol Cal 56 (H) 5 - 40 mg/dL   LDL Calculated 41 0 - 99 mg/dL   Chol/HDL Ratio 3.9 0.0 - 5.0 ratio  Basic metabolic panel  Result Value Ref Range   Glucose 125 (H) 65 - 99 mg/dL   BUN 17 6 - 24 mg/dL   Creatinine, Ser 4.690.95 0.76 - 1.27 mg/dL   GFR calc non Af Amer 88 >59 mL/min/1.73   GFR calc Af Amer 102 >59 mL/min/1.73   BUN/Creatinine Ratio 18 9 - 20   Sodium 142 134 - 144 mmol/L   Potassium 4.8 3.5 - 5.2 mmol/L   Chloride 107 (H) 96 - 106 mmol/L   CO2 21 20 - 29 mmol/L   Calcium 9.3 8.7 - 10.2 mg/dL      Assessment & Plan:   Problem List Items Addressed This Visit      Cardiovascular and Mediastinum   Hypertension   Relevant  Medications   lisinopril (PRINIVIL,ZESTRIL) 10 MG tablet   rosuvastatin (CRESTOR) 5 MG tablet     Endocrine   Diabetes mellitus without complication (HCC)   Relevant Medications   metFORMIN (GLUCOPHAGE) 500 MG tablet   lisinopril (PRINIVIL,ZESTRIL) 10 MG tablet   dapagliflozin propanediol ([name]) 10 MG TABS tablet   rosuvastatin (CRESTOR) 5 MG tablet     Other   Hyperlipidemia   Relevant Medications   lisinopril (PRINIVIL,ZESTRIL) 10 MG tablet   rosuvastatin (CRESTOR) 5 MG tablet       Follow up plan: Return in about 3 months (around 10/29/2018) for Physical Exam, Hemoglobin A1c.

## 2018-10-08 DIAGNOSIS — M1711 Unilateral primary osteoarthritis, right knee: Secondary | ICD-10-CM | POA: Diagnosis not present

## 2018-10-29 DIAGNOSIS — M25661 Stiffness of right knee, not elsewhere classified: Secondary | ICD-10-CM | POA: Diagnosis not present

## 2018-10-29 DIAGNOSIS — M25561 Pain in right knee: Secondary | ICD-10-CM | POA: Diagnosis not present

## 2018-10-29 DIAGNOSIS — Z01818 Encounter for other preprocedural examination: Secondary | ICD-10-CM | POA: Diagnosis not present

## 2018-10-30 ENCOUNTER — Other Ambulatory Visit: Payer: Self-pay

## 2018-10-30 DIAGNOSIS — Z0000 Encounter for general adult medical examination without abnormal findings: Secondary | ICD-10-CM

## 2018-10-31 ENCOUNTER — Other Ambulatory Visit: Payer: Self-pay

## 2018-10-31 DIAGNOSIS — Z0000 Encounter for general adult medical examination without abnormal findings: Secondary | ICD-10-CM

## 2018-11-01 LAB — COMPREHENSIVE METABOLIC PANEL
A/G RATIO: 2.2 (ref 1.2–2.2)
ALBUMIN: 4.9 g/dL (ref 3.8–4.9)
ALT: 22 IU/L (ref 0–44)
AST: 18 IU/L (ref 0–40)
Alkaline Phosphatase: 68 IU/L (ref 39–117)
BUN / CREAT RATIO: 15 (ref 9–20)
BUN: 13 mg/dL (ref 6–24)
Bilirubin Total: 0.4 mg/dL (ref 0.0–1.2)
CO2: 23 mmol/L (ref 20–29)
Calcium: 9.5 mg/dL (ref 8.7–10.2)
Chloride: 104 mmol/L (ref 96–106)
Creatinine, Ser: 0.88 mg/dL (ref 0.76–1.27)
GFR calc Af Amer: 110 mL/min/{1.73_m2} (ref >59)
GFR calc non Af Amer: 95 mL/min/{1.73_m2} (ref >59)
GLOBULIN, TOTAL: 2.2 g/dL (ref 1.5–4.5)
Glucose: 148 mg/dL — ABNORMAL HIGH (ref 65–99)
Potassium: 4.8 mmol/L (ref 3.5–5.2)
Sodium: 143 mmol/L (ref 134–144)
Total Protein: 7.1 g/dL (ref 6.0–8.5)

## 2018-11-01 LAB — CBC WITH DIFFERENTIAL/PLATELET
Basophils Absolute: 0 10*3/uL (ref 0.0–0.2)
Basos: 1 %
EOS (ABSOLUTE): 0.1 10*3/uL (ref 0.0–0.4)
Eos: 2 %
Hematocrit: 44.2 % (ref 37.5–51.0)
Hemoglobin: 14.9 g/dL (ref 13.0–17.7)
Immature Grans (Abs): 0 10*3/uL (ref 0.0–0.1)
Immature Granulocytes: 0 %
LYMPHS ABS: 1.8 10*3/uL (ref 0.7–3.1)
Lymphs: 37 %
MCH: 29.4 pg (ref 26.6–33.0)
MCHC: 33.7 g/dL (ref 31.5–35.7)
MCV: 87 fL (ref 79–97)
MONOS ABS: 0.5 10*3/uL (ref 0.1–0.9)
Monocytes: 10 %
Neutrophils Absolute: 2.4 10*3/uL (ref 1.4–7.0)
Neutrophils: 50 %
Platelets: 233 10*3/uL (ref 150–450)
RBC: 5.06 x10E6/uL (ref 4.14–5.80)
RDW: 14.4 % (ref 11.6–15.4)
WBC: 4.8 10*3/uL (ref 3.4–10.8)

## 2018-11-01 LAB — URINALYSIS, ROUTINE W REFLEX MICROSCOPIC
Bilirubin, UA: NEGATIVE
Ketones, UA: NEGATIVE
Leukocytes, UA: NEGATIVE
Nitrite, UA: NEGATIVE
Protein, UA: NEGATIVE
RBC, UA: NEGATIVE
Specific Gravity, UA: 1.028 (ref 1.005–1.030)
Urobilinogen, Ur: 0.2 mg/dL (ref 0.2–1.0)
pH, UA: 5 (ref 5.0–7.5)

## 2018-11-01 LAB — HGB A1C W/O EAG: Hgb A1c MFr Bld: 7.2 % — ABNORMAL HIGH (ref 4.8–5.6)

## 2018-11-01 LAB — TSH: TSH: 1.07 u[IU]/mL (ref 0.450–4.500)

## 2018-11-01 LAB — LIPID PANEL
CHOLESTEROL TOTAL: 121 mg/dL (ref 100–199)
Chol/HDL Ratio: 3.5 ratio (ref 0.0–5.0)
HDL: 35 mg/dL — ABNORMAL LOW (ref >39)
LDL Calculated: 41 mg/dL (ref 0–99)
TRIGLYCERIDES: 226 mg/dL — AB (ref 0–149)
VLDL Cholesterol Cal: 45 mg/dL — ABNORMAL HIGH (ref 5–40)

## 2018-11-13 ENCOUNTER — Encounter: Payer: Self-pay | Admitting: Family Medicine

## 2018-11-13 ENCOUNTER — Ambulatory Visit: Payer: BLUE CROSS/BLUE SHIELD | Admitting: Family Medicine

## 2018-11-13 VITALS — BP 126/83 | HR 87 | Temp 98.4°F | Ht 73.2 in | Wt 287.0 lb

## 2018-11-13 DIAGNOSIS — E1142 Type 2 diabetes mellitus with diabetic polyneuropathy: Secondary | ICD-10-CM

## 2018-11-13 DIAGNOSIS — E7800 Pure hypercholesterolemia, unspecified: Secondary | ICD-10-CM | POA: Diagnosis not present

## 2018-11-13 DIAGNOSIS — Z0000 Encounter for general adult medical examination without abnormal findings: Secondary | ICD-10-CM | POA: Diagnosis not present

## 2018-11-13 DIAGNOSIS — I10 Essential (primary) hypertension: Secondary | ICD-10-CM

## 2018-11-13 DIAGNOSIS — E119 Type 2 diabetes mellitus without complications: Secondary | ICD-10-CM

## 2018-11-13 DIAGNOSIS — L711 Rhinophyma: Secondary | ICD-10-CM

## 2018-11-13 MED ORDER — DAPAGLIFLOZIN PROPANEDIOL 10 MG PO TABS: 10.0000 mg | ORAL_TABLET | Freq: Every day | ORAL | 4 refills | Status: DC

## 2018-11-13 MED ORDER — LISINOPRIL 10 MG PO TABS: 10.0000 mg | ORAL_TABLET | Freq: Every day | ORAL | 4 refills | Status: DC

## 2018-11-13 MED ORDER — ROSUVASTATIN CALCIUM 5 MG PO TABS: 5.0000 mg | ORAL_TABLET | Freq: Every day | ORAL | 4 refills | Status: DC

## 2018-11-13 MED ORDER — METFORMIN HCL 500 MG PO TABS: 1000.0000 mg | ORAL_TABLET | Freq: Two times a day (BID) | ORAL | 4 refills | Status: DC

## 2018-11-13 MED ORDER — MELOXICAM 15 MG PO TABS: 15.0000 mg | ORAL_TABLET | Freq: Every day | ORAL | 4 refills | Status: DC

## 2018-11-13 NOTE — Assessment & Plan Note (Signed)
The current medical regimen is effective;  continue present plan and medications. a 

## 2018-11-13 NOTE — Assessment & Plan Note (Signed)
No change 

## 2018-11-13 NOTE — Progress Notes (Signed)
BP 126/83   Pulse 87   Temp 98.4 F (36.9 C) (Oral)   Ht 6' 1.2" (1.859 m)   Wt 287 lb (130.2 kg)   SpO2 98%   BMI 37.66 kg/m    Subjective:    Patient ID: Dustin Fletcher, male    DOB: 1961-06-21, 58 y.o.   MRN: [id number]  HPI: Dustin Fletcher is a 58 y.o. male  Chief Complaint  Patient presents with  . Annual Exam  . Diabetes  Patient all in all doing well no complaints no low blood sugar spells.  Takes metformin without problems but has noted to have hemoglobin A1c of 7.2  2 weeks ago. Blood pressure doing well no complaints. Same with cholesterol doing well no complaints. Patient getting ready for right knee replacement surgery coming up in 2 weeks.  Relevant past medical, surgical, family and social history reviewed and updated as indicated. Interim medical history since our last visit reviewed. Allergies and medications reviewed and updated.  Review of Systems  Constitutional: Negative.   HENT: Negative.   Eyes: Negative.   Respiratory: Negative.   Cardiovascular: Negative.   Gastrointestinal: Negative.   Endocrine: Negative.   Genitourinary: Negative.   Musculoskeletal: Negative.   Skin: Negative.   Allergic/Immunologic: Negative.   Neurological: Negative.   Hematological: Negative.   Psychiatric/Behavioral: Negative.     Per HPI unless specifically indicated above     Objective:    BP 126/83   Pulse 87   Temp 98.4 F (36.9 C) (Oral)   Ht 6' 1.2" (1.859 m)   Wt 287 lb (130.2 kg)   SpO2 98%   BMI 37.66 kg/m   Wt Readings from Last 3 Encounters:  11/13/18 287 lb (130.2 kg)  07/29/18 288 lb (130.6 kg)  07/17/18 283 lb 6.4 oz (128.5 kg)    Physical Exam Constitutional:      Appearance: He is well-developed.  HENT:     Head: Normocephalic and atraumatic.     Right Ear: External ear normal.     Left Ear: External ear normal.  Eyes:     Conjunctiva/sclera: Conjunctivae normal.     Pupils: Pupils are equal, round, and reactive to  light.  Neck:     Musculoskeletal: Normal range of motion and neck supple.  Cardiovascular:     Rate and Rhythm: Normal rate and regular rhythm.     Heart sounds: Normal heart sounds.  Pulmonary:     Effort: Pulmonary effort is normal.     Breath sounds: Normal breath sounds.  Abdominal:     General: Bowel sounds are normal.     Palpations: Abdomen is soft. There is no hepatomegaly or splenomegaly.  Genitourinary:    Penis: Normal.      Prostate: Normal.     Rectum: Normal.  Musculoskeletal: Normal range of motion.  Skin:    Findings: No erythema or rash.  Neurological:     Mental Status: He is alert and oriented to person, place, and time.     Deep Tendon Reflexes: Reflexes are normal and symmetric.  Psychiatric:        Behavior: Behavior normal.        Thought Content: Thought content normal.        Judgment: Judgment normal.     Results for orders placed or performed in visit on 10/31/18  Urinalysis, Routine w reflex microscopic  Result Value Ref Range   Specific Gravity, UA 1.028 1.005 - 1.030   pH, UA  5.0 5.0 - 7.5   Color, UA Yellow Yellow   Appearance Ur Clear Clear   Leukocytes, UA Negative Negative   Protein, UA Negative Negative/Trace   Glucose, UA 3+ (A) Negative   Ketones, UA Negative Negative   RBC, UA Negative Negative   Bilirubin, UA Negative Negative   Urobilinogen, Ur 0.2 0.2 - 1.0 mg/dL   Nitrite, UA Negative Negative   Microscopic Examination Comment   Hgb A1c w/o eAG  Result Value Ref Range   Hgb A1c MFr Bld 7.2 (H) 4.8 - 5.6 %  TSH  Result Value Ref Range   TSH 1.070 0.450 - 4.500 uIU/mL  Lipid panel  Result Value Ref Range   Cholesterol, Total 121 100 - 199 mg/dL   Triglycerides 789 (H) 0 - 149 mg/dL   HDL 35 (L) >38 mg/dL   VLDL Cholesterol Cal 45 (H) 5 - 40 mg/dL   LDL Calculated 41 0 - 99 mg/dL   Chol/HDL Ratio 3.5 0.0 - 5.0 ratio  Comprehensive metabolic panel  Result Value Ref Range   Glucose 148 (H) 65 - 99 mg/dL   BUN 13 6 -  24 mg/dL   Creatinine, Ser 1.01 0.76 - 1.27 mg/dL   GFR calc non Af Amer 95 >59 mL/min/1.73   GFR calc Af Amer 110 >59 mL/min/1.73   BUN/Creatinine Ratio 15 9 - 20   Sodium 143 134 - 144 mmol/L   Potassium 4.8 3.5 - 5.2 mmol/L   Chloride 104 96 - 106 mmol/L   CO2 23 20 - 29 mmol/L   Calcium 9.5 8.7 - 10.2 mg/dL   Total Protein 7.1 6.0 - 8.5 g/dL   Albumin 4.9 3.8 - 4.9 g/dL   Globulin, Total 2.2 1.5 - 4.5 g/dL   Albumin/Globulin Ratio 2.2 1.2 - 2.2   Bilirubin Total 0.4 0.0 - 1.2 mg/dL   Alkaline Phosphatase 68 39 - 117 IU/L   AST 18 0 - 40 IU/L   ALT 22 0 - 44 IU/L  CBC with Differential/Platelet  Result Value Ref Range   WBC 4.8 3.4 - 10.8 x10E3/uL   RBC 5.06 4.14 - 5.80 x10E6/uL   Hemoglobin 14.9 13.0 - 17.7 g/dL   Hematocrit 75.1 02.5 - 51.0 %   MCV 87 79 - 97 fL   MCH 29.4 26.6 - 33.0 pg   MCHC 33.7 31.5 - 35.7 g/dL   RDW 85.2 77.8 - 24.2 %   Platelets 233 150 - 450 x10E3/uL   Neutrophils 50 Not Estab. %   Lymphs 37 Not Estab. %   Monocytes 10 Not Estab. %   Eos 2 Not Estab. %   Basos 1 Not Estab. %   Neutrophils Absolute 2.4 1.4 - 7.0 x10E3/uL   Lymphocytes Absolute 1.8 0.7 - 3.1 x10E3/uL   Monocytes Absolute 0.5 0.1 - 0.9 x10E3/uL   EOS (ABSOLUTE) 0.1 0.0 - 0.4 x10E3/uL   Basophils Absolute 0.0 0.0 - 0.2 x10E3/uL   Immature Granulocytes 0 Not Estab. %   Immature Grans (Abs) 0.0 0.0 - 0.1 x10E3/uL      Assessment & Plan:   Problem List Items Addressed This Visit      Cardiovascular and Mediastinum   Hypertension    The current medical regimen is effective;  continue present plan and medications.         Endocrine   Diabetic peripheral neuropathy (HCC)    The current medical regimen is effective;  continue present plan and medications. a  RESOLVED: Diabetes mellitus without complication (HCC) - Primary     Other   Hyperlipidemia    The current medical regimen is effective;  continue present plan and medications. a      Rhinophyma    No  change       Other Visit Diagnoses    Routine general medical examination at a health care facility           Follow up plan: Return in about 6 months (around 05/16/2019) for Hemoglobin A1c, BMP,  Lipids, ALT, AST.

## 2018-11-13 NOTE — Assessment & Plan Note (Signed)
The current medical regimen is effective;  continue present plan and medications.  

## 2018-11-15 DIAGNOSIS — Z01810 Encounter for preprocedural cardiovascular examination: Secondary | ICD-10-CM | POA: Diagnosis not present

## 2018-11-15 DIAGNOSIS — M1711 Unilateral primary osteoarthritis, right knee: Secondary | ICD-10-CM | POA: Diagnosis not present

## 2018-11-15 DIAGNOSIS — Z01818 Encounter for other preprocedural examination: Secondary | ICD-10-CM | POA: Diagnosis not present

## 2018-11-15 DIAGNOSIS — Z01811 Encounter for preprocedural respiratory examination: Secondary | ICD-10-CM | POA: Diagnosis not present

## 2018-11-15 DIAGNOSIS — Z01812 Encounter for preprocedural laboratory examination: Secondary | ICD-10-CM | POA: Diagnosis not present

## 2018-11-19 ENCOUNTER — Telehealth: Payer: Self-pay | Admitting: Family Medicine

## 2018-11-19 NOTE — Telephone Encounter (Signed)
Called and let patient know that this was done and faxed back for him.

## 2018-11-19 NOTE — Telephone Encounter (Signed)
Surgical clearance form in folder on provider's desk. Patient was seen for CPE 11/13/18. Nothing mentioned about surgical clearance in OV note.

## 2018-11-19 NOTE — Telephone Encounter (Signed)
Form has been given to provider to review. Pt requesting update.

## 2018-11-19 NOTE — Telephone Encounter (Signed)
Copied from CRM [id number]. Topic: General - Other >> Nov 19, 2018 10:10 AM Tamela Oddi wrote: Reason for CRM: Patient called to get the status of his surgical release form.  Patient stated that he is having surgery on 11/22/2018 and needs this paperwork as soon as possible.  Patient also said that [facility] said they have sent the request to the office for clearance.  Please advise and call patient back when this has been taken care of.  CB# [phone]

## 2018-11-22 DIAGNOSIS — Z87891 Personal history of nicotine dependence: Secondary | ICD-10-CM | POA: Diagnosis not present

## 2018-11-22 DIAGNOSIS — E785 Hyperlipidemia, unspecified: Secondary | ICD-10-CM | POA: Diagnosis not present

## 2018-11-22 DIAGNOSIS — Z7982 Long term (current) use of aspirin: Secondary | ICD-10-CM | POA: Diagnosis not present

## 2018-11-22 DIAGNOSIS — I10 Essential (primary) hypertension: Secondary | ICD-10-CM | POA: Diagnosis not present

## 2018-11-22 DIAGNOSIS — Z791 Long term (current) use of non-steroidal anti-inflammatories (NSAID): Secondary | ICD-10-CM | POA: Diagnosis not present

## 2018-11-22 DIAGNOSIS — G8918 Other acute postprocedural pain: Secondary | ICD-10-CM | POA: Diagnosis not present

## 2018-11-22 DIAGNOSIS — E119 Type 2 diabetes mellitus without complications: Secondary | ICD-10-CM | POA: Diagnosis not present

## 2018-11-22 DIAGNOSIS — Z79890 Hormone replacement therapy (postmenopausal): Secondary | ICD-10-CM | POA: Diagnosis not present

## 2018-11-22 DIAGNOSIS — M25561 Pain in right knee: Secondary | ICD-10-CM | POA: Diagnosis not present

## 2018-11-22 DIAGNOSIS — Z7984 Long term (current) use of oral hypoglycemic drugs: Secondary | ICD-10-CM | POA: Diagnosis not present

## 2018-11-22 DIAGNOSIS — M1711 Unilateral primary osteoarthritis, right knee: Secondary | ICD-10-CM | POA: Diagnosis not present

## 2018-11-26 DIAGNOSIS — M1711 Unilateral primary osteoarthritis, right knee: Secondary | ICD-10-CM | POA: Insufficient documentation

## 2018-11-29 DIAGNOSIS — M25661 Stiffness of right knee, not elsewhere classified: Secondary | ICD-10-CM | POA: Diagnosis not present

## 2018-11-29 DIAGNOSIS — M25561 Pain in right knee: Secondary | ICD-10-CM | POA: Diagnosis not present

## 2018-12-03 DIAGNOSIS — M25661 Stiffness of right knee, not elsewhere classified: Secondary | ICD-10-CM | POA: Diagnosis not present

## 2018-12-03 DIAGNOSIS — M25561 Pain in right knee: Secondary | ICD-10-CM | POA: Diagnosis not present

## 2018-12-06 DIAGNOSIS — M25561 Pain in right knee: Secondary | ICD-10-CM | POA: Diagnosis not present

## 2018-12-06 DIAGNOSIS — M25661 Stiffness of right knee, not elsewhere classified: Secondary | ICD-10-CM | POA: Diagnosis not present

## 2018-12-09 DIAGNOSIS — M25661 Stiffness of right knee, not elsewhere classified: Secondary | ICD-10-CM | POA: Diagnosis not present

## 2018-12-09 DIAGNOSIS — M25561 Pain in right knee: Secondary | ICD-10-CM | POA: Diagnosis not present

## 2018-12-10 DIAGNOSIS — Z96659 Presence of unspecified artificial knee joint: Secondary | ICD-10-CM | POA: Insufficient documentation

## 2018-12-11 DIAGNOSIS — M25661 Stiffness of right knee, not elsewhere classified: Secondary | ICD-10-CM | POA: Diagnosis not present

## 2018-12-11 DIAGNOSIS — M25561 Pain in right knee: Secondary | ICD-10-CM | POA: Diagnosis not present

## 2018-12-17 DIAGNOSIS — M25661 Stiffness of right knee, not elsewhere classified: Secondary | ICD-10-CM | POA: Diagnosis not present

## 2018-12-17 DIAGNOSIS — M25561 Pain in right knee: Secondary | ICD-10-CM | POA: Diagnosis not present

## 2018-12-19 DIAGNOSIS — M25661 Stiffness of right knee, not elsewhere classified: Secondary | ICD-10-CM | POA: Diagnosis not present

## 2018-12-19 DIAGNOSIS — M25561 Pain in right knee: Secondary | ICD-10-CM | POA: Diagnosis not present

## 2018-12-23 DIAGNOSIS — M25661 Stiffness of right knee, not elsewhere classified: Secondary | ICD-10-CM | POA: Diagnosis not present

## 2018-12-23 DIAGNOSIS — M25561 Pain in right knee: Secondary | ICD-10-CM | POA: Diagnosis not present

## 2018-12-27 DIAGNOSIS — M25661 Stiffness of right knee, not elsewhere classified: Secondary | ICD-10-CM | POA: Diagnosis not present

## 2018-12-27 DIAGNOSIS — M25561 Pain in right knee: Secondary | ICD-10-CM | POA: Diagnosis not present

## 2018-12-30 DIAGNOSIS — Z96651 Presence of right artificial knee joint: Secondary | ICD-10-CM | POA: Diagnosis not present

## 2018-12-31 DIAGNOSIS — Z471 Aftercare following joint replacement surgery: Secondary | ICD-10-CM | POA: Diagnosis not present

## 2018-12-31 DIAGNOSIS — Z96651 Presence of right artificial knee joint: Secondary | ICD-10-CM | POA: Diagnosis not present

## 2019-01-03 DIAGNOSIS — Z96651 Presence of right artificial knee joint: Secondary | ICD-10-CM | POA: Diagnosis not present

## 2019-01-07 DIAGNOSIS — M25561 Pain in right knee: Secondary | ICD-10-CM | POA: Diagnosis not present

## 2019-01-07 DIAGNOSIS — M25661 Stiffness of right knee, not elsewhere classified: Secondary | ICD-10-CM | POA: Diagnosis not present

## 2019-01-09 DIAGNOSIS — M25561 Pain in right knee: Secondary | ICD-10-CM | POA: Diagnosis not present

## 2019-01-09 DIAGNOSIS — M25661 Stiffness of right knee, not elsewhere classified: Secondary | ICD-10-CM | POA: Diagnosis not present

## 2019-01-13 DIAGNOSIS — M1711 Unilateral primary osteoarthritis, right knee: Secondary | ICD-10-CM | POA: Diagnosis not present

## 2019-01-13 DIAGNOSIS — Z96651 Presence of right artificial knee joint: Secondary | ICD-10-CM | POA: Diagnosis not present

## 2019-01-16 DIAGNOSIS — M1711 Unilateral primary osteoarthritis, right knee: Secondary | ICD-10-CM | POA: Diagnosis not present

## 2019-01-16 DIAGNOSIS — Z96651 Presence of right artificial knee joint: Secondary | ICD-10-CM | POA: Diagnosis not present

## 2019-01-20 DIAGNOSIS — Z96651 Presence of right artificial knee joint: Secondary | ICD-10-CM | POA: Diagnosis not present

## 2019-01-20 DIAGNOSIS — M1711 Unilateral primary osteoarthritis, right knee: Secondary | ICD-10-CM | POA: Diagnosis not present

## 2019-01-22 DIAGNOSIS — M25661 Stiffness of right knee, not elsewhere classified: Secondary | ICD-10-CM | POA: Diagnosis not present

## 2019-01-22 DIAGNOSIS — M25561 Pain in right knee: Secondary | ICD-10-CM | POA: Diagnosis not present

## 2019-01-30 DIAGNOSIS — M25561 Pain in right knee: Secondary | ICD-10-CM | POA: Diagnosis not present

## 2019-01-30 DIAGNOSIS — M25661 Stiffness of right knee, not elsewhere classified: Secondary | ICD-10-CM | POA: Diagnosis not present

## 2019-02-04 DIAGNOSIS — M25661 Stiffness of right knee, not elsewhere classified: Secondary | ICD-10-CM | POA: Diagnosis not present

## 2019-02-04 DIAGNOSIS — M25561 Pain in right knee: Secondary | ICD-10-CM | POA: Diagnosis not present

## 2019-02-06 DIAGNOSIS — M25561 Pain in right knee: Secondary | ICD-10-CM | POA: Diagnosis not present

## 2019-02-06 DIAGNOSIS — M25661 Stiffness of right knee, not elsewhere classified: Secondary | ICD-10-CM | POA: Diagnosis not present

## 2019-06-05 ENCOUNTER — Other Ambulatory Visit: Payer: Self-pay

## 2019-06-05 DIAGNOSIS — E1342 Other specified diabetes mellitus with diabetic polyneuropathy: Secondary | ICD-10-CM

## 2019-06-05 DIAGNOSIS — E785 Hyperlipidemia, unspecified: Secondary | ICD-10-CM

## 2019-06-05 DIAGNOSIS — E1100 Type 2 diabetes mellitus with hyperosmolarity without nonketotic hyperglycemic-hyperosmolar coma (NKHHC): Secondary | ICD-10-CM

## 2019-06-06 LAB — BASIC METABOLIC PANEL
BUN/Creatinine Ratio: 14 (ref 9–20)
BUN: 13 mg/dL (ref 6–24)
CO2: 23 mmol/L (ref 20–29)
Calcium: 9.7 mg/dL (ref 8.7–10.2)
Chloride: 103 mmol/L (ref 96–106)
Creatinine, Ser: 0.96 mg/dL (ref 0.76–1.27)
GFR calc Af Amer: 100 mL/min/{1.73_m2} (ref >59)
GFR calc non Af Amer: 87 mL/min/{1.73_m2} (ref >59)
Glucose: 147 mg/dL — ABNORMAL HIGH (ref 65–99)
Potassium: 4.5 mmol/L (ref 3.5–5.2)
Sodium: 141 mmol/L (ref 134–144)

## 2019-06-06 LAB — LIPID PANEL
Chol/HDL Ratio: 3.7 ratio (ref 0.0–5.0)
Cholesterol, Total: 139 mg/dL (ref 100–199)
HDL: 38 mg/dL — ABNORMAL LOW (ref >39)
LDL Chol Calc (NIH): 58 mg/dL (ref 0–99)
Triglycerides: 269 mg/dL — ABNORMAL HIGH (ref 0–149)
VLDL Cholesterol Cal: 43 mg/dL — ABNORMAL HIGH (ref 5–40)

## 2019-06-06 LAB — AST: AST: 13 IU/L (ref 0–40)

## 2019-06-06 LAB — HGB A1C W/O EAG: Hgb A1c MFr Bld: 7.1 % — ABNORMAL HIGH (ref 4.8–5.6)

## 2019-06-06 LAB — ALT: ALT: 17 IU/L (ref 0–44)

## 2019-06-09 ENCOUNTER — Encounter: Payer: Self-pay | Admitting: Family Medicine

## 2019-06-16 ENCOUNTER — Other Ambulatory Visit: Payer: Self-pay

## 2019-06-16 ENCOUNTER — Encounter: Payer: Self-pay | Admitting: Family Medicine

## 2019-06-16 ENCOUNTER — Ambulatory Visit (INDEPENDENT_AMBULATORY_CARE_PROVIDER_SITE_OTHER): Payer: BC Managed Care – PPO | Admitting: Family Medicine

## 2019-06-16 DIAGNOSIS — I10 Essential (primary) hypertension: Secondary | ICD-10-CM | POA: Diagnosis not present

## 2019-06-16 DIAGNOSIS — E7800 Pure hypercholesterolemia, unspecified: Secondary | ICD-10-CM

## 2019-06-16 DIAGNOSIS — E1142 Type 2 diabetes mellitus with diabetic polyneuropathy: Secondary | ICD-10-CM

## 2019-06-16 NOTE — Assessment & Plan Note (Signed)
The current medical regimen is effective;  continue present plan and medications.  

## 2019-06-16 NOTE — Progress Notes (Signed)
There were no vitals taken for this visit.   Subjective:    Patient ID: Dustin Fletcher, male    DOB: [birth year], 58 y.o.   MRN: [id number]  HPI: Dustin Fletcher is a 59 y.o. male  Med check  Discussed with patient diabetes worsening with patient liberalizing his diet.  Discussed doing better and better A1c. Discussed other medicines doing well with blood pressure cholesterol no issues good control. Patient has some blackness on his toe from bruising but no known bruising and also toenail fungus wants treatment for he is going to send pictures tonight.  Tried to face time and that was unsuccessful.  Relevant past medical, surgical, family and social history reviewed and updated as indicated. Interim medical history since our last visit reviewed. Allergies and medications reviewed and updated.  Review of Systems  Constitutional: Negative.   Respiratory: Negative.   Cardiovascular: Negative.     Per HPI unless specifically indicated above     Objective:    There were no vitals taken for this visit.  Wt Readings from Last 3 Encounters:  11/13/18 287 lb (130.2 kg)  07/29/18 288 lb (130.6 kg)  07/17/18 283 lb 6.4 oz (128.5 kg)    Physical Exam  Results for orders placed or performed in visit on 06/05/19  AST  Result Value Ref Range   AST 13 0 - 40 IU/L  ALT  Result Value Ref Range   ALT 17 0 - 44 IU/L  Basic metabolic panel  Result Value Ref Range   Glucose 147 (H) 65 - 99 mg/dL   BUN 13 6 - 24 mg/dL   Creatinine, Ser 0.86 0.76 - 1.27 mg/dL   GFR calc non Af Amer 87 >59 mL/min/1.73   GFR calc Af Amer 100 >59 mL/min/1.73   BUN/Creatinine Ratio 14 9 - 20   Sodium 141 134 - 144 mmol/L   Potassium 4.5 3.5 - 5.2 mmol/L   Chloride 103 96 - 106 mmol/L   CO2 23 20 - 29 mmol/L   Calcium 9.7 8.7 - 10.2 mg/dL  Lipid panel  Result Value Ref Range   Cholesterol, Total 139 100 - 199 mg/dL   Triglycerides 578 (H) 0 - 149 mg/dL   HDL 38 (L) >46 mg/dL   VLDL  Cholesterol Cal 43 (H) 5 - 40 mg/dL   LDL Chol Calc (NIH) 58 0 - 99 mg/dL   Chol/HDL Ratio 3.7 0.0 - 5.0 ratio  Hgb A1c w/o eAG  Result Value Ref Range   Hgb A1c MFr Bld 7.1 (H) 4.8 - 5.6 %      Assessment & Plan:   Problem List Items Addressed This Visit      Cardiovascular and Mediastinum   Hypertension    The current medical regimen is effective;  continue present plan and medications.         Endocrine   Diabetic peripheral neuropathy (HCC)    The current medical regimen is effective;  continue present plan and medications.         Other   Hyperlipidemia    The current medical regimen is effective;  continue present plan and medications.         Telemedicine using audio/video telecommunications for a synchronous communication visit. Today's visit due to COVID-19 isolation precautions I connected with and verified that I am speaking with the correct person using two identifiers.   I discussed the limitations, risks, security and privacy concerns of performing an evaluation and management service  by telecommunication and the availability of in person appointments. I also discussed with the patient that there may be a patient responsible charge related to this service. The patient expressed understanding and agreed to proceed. The patient's location is work. I am at home.   I discussed the assessment and treatment plan with the patient. The patient was provided an opportunity to ask questions and all were answered. The patient agreed with the plan and demonstrated an understanding of the instructions.   The patient was advised to call back or seek an in-person evaluation if the symptoms worsen or if the condition fails to improve as anticipated.   I provided 21+ minutes of time during this encounter.  Follow up plan: Return in about 6 months (around 12/15/2019) for Physical Exam.

## 2019-06-17 ENCOUNTER — Other Ambulatory Visit: Payer: Self-pay | Admitting: Family Medicine

## 2019-06-17 MED ORDER — TERBINAFINE HCL 250 MG PO TABS: 250.0000 mg | ORAL_TABLET | Freq: Every day | ORAL | 2 refills | Status: DC

## 2019-10-06 ENCOUNTER — Telehealth: Payer: Self-pay

## 2019-10-06 NOTE — Telephone Encounter (Signed)
fyi Copied from CRM [id number]. Topic: General - Other >> Oct 06, 2019 10:45 AM Marylen Ponto wrote: Reason for CRM: Pt stated he was told that he needed to contact pcp office to report that he tested positive for Covid-19.

## 2019-11-24 ENCOUNTER — Other Ambulatory Visit: Payer: Self-pay

## 2019-11-24 ENCOUNTER — Encounter: Payer: Self-pay | Admitting: Family Medicine

## 2019-11-24 DIAGNOSIS — I10 Essential (primary) hypertension: Secondary | ICD-10-CM

## 2019-11-24 DIAGNOSIS — E7800 Pure hypercholesterolemia, unspecified: Secondary | ICD-10-CM

## 2019-11-24 DIAGNOSIS — E119 Type 2 diabetes mellitus without complications: Secondary | ICD-10-CM

## 2019-11-24 NOTE — Telephone Encounter (Signed)
Needs follow up appt in early April- please let me know if he has enough to make it to that appointment.

## 2019-11-24 NOTE — Telephone Encounter (Signed)
Called to schedule appt, no answer, left vm, sending mychart letter

## 2019-11-24 NOTE — Telephone Encounter (Addendum)
Refill request for Lisinopril, Metformin, [name], Rosuvastatin LOV: 06/16/19 Next Appt: None

## 2019-11-27 ENCOUNTER — Encounter: Payer: Self-pay | Admitting: Family Medicine

## 2019-11-27 ENCOUNTER — Telehealth (INDEPENDENT_AMBULATORY_CARE_PROVIDER_SITE_OTHER): Payer: BC Managed Care – PPO | Admitting: Family Medicine

## 2019-11-27 VITALS — Ht 75.0 in | Wt 264.0 lb

## 2019-11-27 DIAGNOSIS — E119 Type 2 diabetes mellitus without complications: Secondary | ICD-10-CM

## 2019-11-27 DIAGNOSIS — I10 Essential (primary) hypertension: Secondary | ICD-10-CM | POA: Diagnosis not present

## 2019-11-27 DIAGNOSIS — E1142 Type 2 diabetes mellitus with diabetic polyneuropathy: Secondary | ICD-10-CM

## 2019-11-27 DIAGNOSIS — E7800 Pure hypercholesterolemia, unspecified: Secondary | ICD-10-CM

## 2019-11-27 MED ORDER — ROSUVASTATIN CALCIUM 5 MG PO TABS: 5.0000 mg | ORAL_TABLET | Freq: Every day | ORAL | 0 refills | Status: DC

## 2019-11-27 MED ORDER — FARXIGA 10 MG PO TABS: 10.0000 mg | ORAL_TABLET | Freq: Every day | ORAL | 0 refills | Status: DC

## 2019-11-27 MED ORDER — MELOXICAM 15 MG PO TABS: 15.0000 mg | ORAL_TABLET | Freq: Every day | ORAL | 0 refills | Status: DC

## 2019-11-27 MED ORDER — METFORMIN HCL 500 MG PO TABS: 1000.0000 mg | ORAL_TABLET | Freq: Two times a day (BID) | ORAL | 1 refills | Status: DC

## 2019-11-27 MED ORDER — LISINOPRIL 10 MG PO TABS: 10.0000 mg | ORAL_TABLET | Freq: Every day | ORAL | 0 refills | Status: DC

## 2019-11-27 NOTE — Progress Notes (Signed)
Ht 6\' 3"  (1.905 m)   Wt 264 lb (119.7 kg)   BMI 33.00 kg/m    Subjective:    Patient ID: Dustin Fletcher, male    DOB: 1960-10-26, 59 y.o.   MRN: [id number]  HPI: Dustin Fletcher is a 59 y.o. male  Chief Complaint  Patient presents with  . Hypertension  . Hyperlipidemia  . Diabetes    . This visit was completed via telephone due to the restrictions of the COVID-19 pandemic. All issues as above were discussed and addressed. Physical exam was done as above through visual confirmation on telephone. If it was felt that the patient should be evaluated in the office, they were directed there. The patient verbally consented to this visit. . Location of the patient: home . Location of the provider: home . Those involved with this call:  . Provider: Merrie Roof, PA-C . CMA: Lesle [name], [place] . Front Desk/Registration: Jill Side  . Time spent on call: 25 minutes on the phone discussing health concerns. 5 minutes total spent in review of patient's record and preparation of their chart. I verified patient identity using two factors (patient name and date of birth). Patient consents verbally to being seen via telemedicine visit today.   Patient presenting today for f/u chronic conditions.   HTN - Not checking home BPs. Taking medicine faithfully without side efects. Denies CP, SOB, HAs, dizziness.   DM - On metformin and farxiga, not checking home sugars. Trying to eat well and stay active. Denies low blood sugar spells, side effects.   HLD - Taking Crestor daily, no side effects. Denies claudication, myalgias.   Relevant past medical, surgical, family and social history reviewed and updated as indicated. Interim medical history since our last visit reviewed. Allergies and medications reviewed and updated.  Review of Systems  Per HPI unless specifically indicated above     Objective:    Ht 6\' 3"  (1.905 m)   Wt 264 lb (119.7 kg)   BMI 33.00 kg/m   Wt Readings from  Last 3 Encounters:  11/27/19 264 lb (119.7 kg)  11/13/18 287 lb (130.2 kg)  07/29/18 288 lb (130.6 kg)    Physical Exam  Unable to perform PE due to patient lack of access to video technology for visit  Results for orders placed or performed in visit on 06/05/19  AST  Result Value Ref Range   AST 13 0 - 40 IU/L  ALT  Result Value Ref Range   ALT 17 0 - 44 IU/L  Basic metabolic panel  Result Value Ref Range   Glucose 147 (H) 65 - 99 mg/dL   BUN 13 6 - 24 mg/dL   Creatinine, Ser 0.96 0.76 - 1.27 mg/dL   GFR calc non Af Amer 87 >59 mL/min/1.73   GFR calc Af Amer 100 >59 mL/min/1.73   BUN/Creatinine Ratio 14 9 - 20   Sodium 141 134 - 144 mmol/L   Potassium 4.5 3.5 - 5.2 mmol/L   Chloride 103 96 - 106 mmol/L   CO2 23 20 - 29 mmol/L   Calcium 9.7 8.7 - 10.2 mg/dL  Lipid panel  Result Value Ref Range   Cholesterol, Total 139 100 - 199 mg/dL   Triglycerides 269 (H) 0 - 149 mg/dL   HDL 38 (L) >39 mg/dL   VLDL Cholesterol Cal 43 (H) 5 - 40 mg/dL   LDL Chol Calc (NIH) 58 0 - 99 mg/dL   Chol/HDL Ratio 3.7 0.0 - 5.0  ratio  Hgb A1c w/o eAG  Result Value Ref Range   Hgb A1c MFr Bld 7.1 (H) 4.8 - 5.6 %      Assessment & Plan:   Problem List Items Addressed This Visit      Cardiovascular and Mediastinum   Hypertension    Unable to provide a BP reading for visit today, requested he go to drugstore to get a reading or drop by clinic for BP check to verify good control. Continue current regimen in meantime      Relevant Orders   Comprehensive metabolic panel     Endocrine   Diabetes mellitus without complication (HCC)    Will recheck labs, adjust if needed. Continue working on lifestyle modifications. Lab orders printed and faxed to his work lab per his request      Relevant Medications   metFORMIN (GLUCOPHAGE) 500 MG tablet   Diabetic peripheral neuropathy (HCC) - Primary   Relevant Medications   metFORMIN (GLUCOPHAGE) 500 MG tablet   Other Relevant Orders   HgB A1c       Other   Hyperlipidemia    Recheck labs, adjust as needed. Continue current regimen      Relevant Orders   Lipid Panel w/o Chol/HDL Ratio       Follow up plan: Return in about 6 months (around 05/29/2020) for CPE.

## 2019-11-27 NOTE — Telephone Encounter (Signed)
Appt scheduled for today.

## 2019-11-27 NOTE — Telephone Encounter (Signed)
Rx's sent to [facility] today.

## 2019-11-28 ENCOUNTER — Ambulatory Visit: Payer: Self-pay | Admitting: Nurse Practitioner

## 2019-11-28 ENCOUNTER — Other Ambulatory Visit: Payer: Self-pay

## 2019-11-28 ENCOUNTER — Encounter: Payer: Self-pay | Admitting: Nurse Practitioner

## 2019-11-28 ENCOUNTER — Encounter: Payer: Self-pay | Admitting: Family Medicine

## 2019-11-28 VITALS — BP 123/69 | HR 97 | Temp 98.1°F | Resp 18 | Ht 75.0 in | Wt 286.0 lb

## 2019-11-28 DIAGNOSIS — I10 Essential (primary) hypertension: Secondary | ICD-10-CM

## 2019-11-28 NOTE — Assessment & Plan Note (Signed)
Will recheck labs, adjust if needed. Continue working on lifestyle modifications. Lab orders printed and faxed to his work lab per his request

## 2019-11-28 NOTE — Assessment & Plan Note (Signed)
Unable to provide a BP reading for visit today, requested he go to drugstore to get a reading or drop by clinic for BP check to verify good control. Continue current regimen in meantime

## 2019-11-28 NOTE — Progress Notes (Signed)
   Subjective:    Patient ID: Dustin Fletcher, male    DOB: April 22, 1961, 59 y.o.   MRN: [id number]  HPI Dustin Fletcher is here today requesting a blood pressure check per his PCP. He reports that he's on Lisinopril and taking as directed. He endorses he's tolerating the med well and has no concerns today. He denies missing any doses and has refills from his prescribing provider.    Review of Systems  Constitutional: Negative for fever.       Negative Covid screening  Respiratory: Negative for shortness of breath.   Cardiovascular: Negative for chest pain.       Objective:   Physical Exam Constitutional:      Appearance: Normal appearance.  Neck:     Vascular: No carotid bruit.  Cardiovascular:     Rate and Rhythm: Normal rate.     Pulses: Normal pulses.     Heart sounds: No murmur.     Comments: Radials 2+, no lower ext. edema Pulmonary:     Effort: Pulmonary effort is normal.     Breath sounds: Normal breath sounds.  Abdominal:     General: Bowel sounds are normal. There is no distension.     Tenderness: There is no abdominal tenderness.  Skin:    General: Skin is warm and dry.  Neurological:     General: No focal deficit present.     Mental Status: He is alert and oriented to person, place, and time.  Psychiatric:        Thought Content: Thought content normal.        Judgment: Judgment normal.     Comments: Flat affect           Assessment & Plan:

## 2019-11-28 NOTE — Patient Instructions (Signed)
Your blood pressure is good today Please continue your current regimen and follow up with your doctor Return to the clinic as needed and for blood pressure readings

## 2019-11-28 NOTE — Assessment & Plan Note (Signed)
Recheck labs, adjust as needed. Continue current regimen

## 2019-12-03 ENCOUNTER — Encounter: Payer: Self-pay | Admitting: Family Medicine

## 2019-12-03 ENCOUNTER — Telehealth: Payer: Self-pay | Admitting: Family Medicine

## 2019-12-03 NOTE — Telephone Encounter (Signed)
erroneous error  

## 2019-12-03 NOTE — Telephone Encounter (Signed)
Lvm,sent letter to make physical for 6 months/

## 2019-12-03 NOTE — Telephone Encounter (Signed)
-----   Message from Particia Nearing, [place] sent at 11/28/2019  6:22 AM EDT ----- 6 month CPE

## 2019-12-04 ENCOUNTER — Other Ambulatory Visit: Payer: Self-pay

## 2019-12-04 DIAGNOSIS — I10 Essential (primary) hypertension: Secondary | ICD-10-CM

## 2019-12-04 DIAGNOSIS — E7800 Pure hypercholesterolemia, unspecified: Secondary | ICD-10-CM

## 2019-12-04 DIAGNOSIS — E1142 Type 2 diabetes mellitus with diabetic polyneuropathy: Secondary | ICD-10-CM

## 2019-12-05 LAB — COMPREHENSIVE METABOLIC PANEL
ALT: 18 IU/L (ref 0–44)
AST: 20 IU/L (ref 0–40)
Albumin/Globulin Ratio: 1.7 (ref 1.2–2.2)
Albumin: 4.7 g/dL (ref 3.8–4.9)
Alkaline Phosphatase: 77 IU/L (ref 39–117)
BUN/Creatinine Ratio: 18 (ref 9–20)
BUN: 16 mg/dL (ref 6–24)
Bilirubin Total: 0.5 mg/dL (ref 0.0–1.2)
CO2: 21 mmol/L (ref 20–29)
Calcium: 9.7 mg/dL (ref 8.7–10.2)
Chloride: 101 mmol/L (ref 96–106)
Creatinine, Ser: 0.91 mg/dL (ref 0.76–1.27)
GFR calc Af Amer: 107 mL/min/{1.73_m2} (ref >59)
GFR calc non Af Amer: 93 mL/min/{1.73_m2} (ref >59)
Globulin, Total: 2.8 g/dL (ref 1.5–4.5)
Glucose: 164 mg/dL — ABNORMAL HIGH (ref 65–99)
Potassium: 4.5 mmol/L (ref 3.5–5.2)
Sodium: 138 mmol/L (ref 134–144)
Total Protein: 7.5 g/dL (ref 6.0–8.5)

## 2019-12-05 LAB — HEMOGLOBIN A1C
Est. average glucose Bld gHb Est-mCnc: 177 mg/dL
Hgb A1c MFr Bld: 7.8 % — ABNORMAL HIGH (ref 4.8–5.6)

## 2019-12-05 LAB — LIPID PANEL W/O CHOL/HDL RATIO
Cholesterol, Total: 145 mg/dL (ref 100–199)
HDL: 35 mg/dL — ABNORMAL LOW (ref >39)
LDL Chol Calc (NIH): 64 mg/dL (ref 0–99)
Triglycerides: 291 mg/dL — ABNORMAL HIGH (ref 0–149)
VLDL Cholesterol Cal: 46 mg/dL — ABNORMAL HIGH (ref 5–40)

## 2019-12-15 NOTE — Telephone Encounter (Signed)
Called pt to discuss lab results, left VM to return call

## 2019-12-16 ENCOUNTER — Encounter: Payer: Self-pay | Admitting: Family Medicine

## 2019-12-16 ENCOUNTER — Telehealth: Payer: Self-pay | Admitting: Family Medicine

## 2019-12-16 ENCOUNTER — Other Ambulatory Visit: Payer: Self-pay | Admitting: Family Medicine

## 2019-12-16 DIAGNOSIS — E119 Type 2 diabetes mellitus without complications: Secondary | ICD-10-CM

## 2019-12-16 MED ORDER — OZEMPIC (0.25 OR 0.5 MG/DOSE) 2 MG/1.5ML ~~LOC~~ SOPN: 0.2500 mg | PEN_INJECTOR | SUBCUTANEOUS | 2 refills | Status: DC

## 2019-12-16 NOTE — Progress Notes (Signed)
ozempic

## 2019-12-16 NOTE — Telephone Encounter (Signed)
Called pt yesterday and today, left VM to discuss labs. Also sent over mychart note with details as I wasn't able to speak with him today and asked him to call with any follow up questions to that

## 2019-12-17 NOTE — Telephone Encounter (Signed)
Called pt to discuss message, left VM asking him to return call

## 2019-12-18 NOTE — Telephone Encounter (Signed)
Called and spoke with patient, he states his A1C is likely up due to inactivity and less strict diet the past few months due to COVID infection and recent surgery. Would like to work on lifestyle changes for the next 3 months and then recheck things at that point.   ** Will order A1C to be printed and faxed to [facility] office for him to get in 3 months.   ** Please make him a 3 month DM f/u appt

## 2019-12-18 NOTE — Telephone Encounter (Signed)
Pt returned call .  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  You can leave a voice message if doesn't pick up.

## 2019-12-19 NOTE — Telephone Encounter (Signed)
Called patient and scheduled a 3 month follow up. Also, A1c order for blood work printed out and faxed over to [facility].

## 2020-02-23 ENCOUNTER — Other Ambulatory Visit: Payer: Self-pay | Admitting: Family Medicine

## 2020-02-23 DIAGNOSIS — I10 Essential (primary) hypertension: Secondary | ICD-10-CM

## 2020-02-23 DIAGNOSIS — E7800 Pure hypercholesterolemia, unspecified: Secondary | ICD-10-CM

## 2020-03-16 ENCOUNTER — Other Ambulatory Visit: Payer: BC Managed Care – PPO

## 2020-03-16 ENCOUNTER — Other Ambulatory Visit: Payer: Self-pay

## 2020-03-16 DIAGNOSIS — E119 Type 2 diabetes mellitus without complications: Secondary | ICD-10-CM

## 2020-03-17 LAB — HEMOGLOBIN A1C
Est. average glucose Bld gHb Est-mCnc: 166 mg/dL
Hgb A1c MFr Bld: 7.4 % — ABNORMAL HIGH (ref 4.8–5.6)

## 2020-03-19 ENCOUNTER — Ambulatory Visit (INDEPENDENT_AMBULATORY_CARE_PROVIDER_SITE_OTHER): Payer: BC Managed Care – PPO | Admitting: Family Medicine

## 2020-03-19 ENCOUNTER — Other Ambulatory Visit: Payer: Self-pay

## 2020-03-19 ENCOUNTER — Encounter: Payer: Self-pay | Admitting: Family Medicine

## 2020-03-19 VITALS — BP 114/78 | HR 85 | Temp 98.3°F | Wt 271.0 lb

## 2020-03-19 DIAGNOSIS — E119 Type 2 diabetes mellitus without complications: Secondary | ICD-10-CM

## 2020-03-19 DIAGNOSIS — I10 Essential (primary) hypertension: Secondary | ICD-10-CM

## 2020-03-19 DIAGNOSIS — E7800 Pure hypercholesterolemia, unspecified: Secondary | ICD-10-CM

## 2020-03-19 NOTE — Progress Notes (Signed)
BP 114/78    Pulse 85    Temp 98.3 F (36.8 C) (Oral)    Wt 271 lb (122.9 kg)    SpO2 97%    BMI 33.87 kg/m    Subjective:    Patient ID: Dustin Fletcher, male    DOB: April 02, 1961, 59 y.o.   MRN: [id number]  HPI: Dustin Fletcher is a 59 y.o. male  Chief Complaint  Patient presents with   Diabetes   Patient presenting today for DM f/u. Has been working on diet and exercise improvements to help reduce A1C with hopes of not increasing med regimen. Taking medications faithfully without side effects. Denies low blood sugar spells. Not checking home BSs.   Relevant past medical, surgical, family and social history reviewed and updated as indicated. Interim medical history since our last visit reviewed. Allergies and medications reviewed and updated.  Review of Systems  Per HPI unless specifically indicated above     Objective:    BP 114/78    Pulse 85    Temp 98.3 F (36.8 C) (Oral)    Wt 271 lb (122.9 kg)    SpO2 97%    BMI 33.87 kg/m   Wt Readings from Last 3 Encounters:  03/19/20 271 lb (122.9 kg)  11/28/19 286 lb (129.7 kg)  11/27/19 264 lb (119.7 kg)    Physical Exam Vitals and nursing note reviewed.  Constitutional:      Appearance: Normal appearance.  HENT:     Head: Atraumatic.  Eyes:     Extraocular Movements: Extraocular movements intact.     Conjunctiva/sclera: Conjunctivae normal.  Cardiovascular:     Rate and Rhythm: Normal rate and regular rhythm.  Pulmonary:     Effort: Pulmonary effort is normal.     Breath sounds: Normal breath sounds.  Musculoskeletal:        General: Normal range of motion.     Cervical back: Normal range of motion and neck supple.  Skin:    General: Skin is warm and dry.  Neurological:     General: No focal deficit present.     Mental Status: He is oriented to person, place, and time.  Psychiatric:        Mood and Affect: Mood normal.        Thought Content: Thought content normal.        Judgment: Judgment normal.      Results for orders placed or performed in visit on 03/16/20  HgB A1c  Result Value Ref Range   Hgb A1c MFr Bld 7.4 (H) 4.8 - 5.6 %   Est. average glucose Bld gHb Est-mCnc 166 mg/dL      Assessment & Plan:   Problem List Items Addressed This Visit      Cardiovascular and Mediastinum   Hypertension    Stable, orders placed for next set of labs to be drawn at [facility] per patient request      Relevant Orders   Lipid Panel w/o Chol/HDL Ratio     Endocrine   Diabetes mellitus without complication (HCC) - Primary    A1C improved but still a bit above goal. Pt opting to continue working on lifestyle changes at this time rather than increasing regimen. Will recheck in 3 months      Relevant Orders   HgB A1c     Other   Hyperlipidemia   Relevant Orders   Comprehensive metabolic panel       Follow up plan: Return  in about 6 months (around 09/19/2020) for CPE, 6 month f/u on different days.

## 2020-03-21 NOTE — Assessment & Plan Note (Signed)
Stable, orders placed for next set of labs to be drawn at [facility] per patient request

## 2020-03-21 NOTE — Assessment & Plan Note (Signed)
A1C improved but still a bit above goal. Pt opting to continue working on lifestyle changes at this time rather than increasing regimen. Will recheck in 3 months

## 2020-04-23 ENCOUNTER — Other Ambulatory Visit: Payer: Self-pay | Admitting: Family Medicine

## 2020-04-23 DIAGNOSIS — E119 Type 2 diabetes mellitus without complications: Secondary | ICD-10-CM

## 2020-04-23 NOTE — Telephone Encounter (Signed)
Requested Prescriptions  Pending Prescriptions Disp Refills   [name] 10 MG TABS tablet [Pharmacy Med Name: [name] 10 MG TABLET] 90 tablet 0    Sig: TAKE ONE TABLET BY MOUTH DAILY     Endocrinology:  Diabetes - SGLT2 Inhibitors Failed - 04/23/2020  9:53 AM      Failed - LDL in normal range and within 360 days    LDL Chol Calc (NIH)  Date Value Ref Range Status  12/04/2019 64 0 - 99 mg/dL Final         Passed - Cr in normal range and within 360 days    Creatinine, Ser  Date Value Ref Range Status  12/04/2019 0.91 0.76 - 1.27 mg/dL Final         Passed - HBA1C is between 0 and 7.9 and within 180 days    Hemoglobin A1C  Date Value Ref Range Status  02/23/2016 6.9  Final   Hgb A1c MFr Bld  Date Value Ref Range Status  03/16/2020 7.4 (H) 4.8 - 5.6 % Final    Comment:             Prediabetes: 5.7 - 6.4          Diabetes: >6.4          Glycemic control for adults with diabetes: <7.0          Passed - eGFR in normal range and within 360 days    GFR calc Af Amer  Date Value Ref Range Status  12/04/2019 107 >59 mL/min/1.73 Final   GFR calc non Af Amer  Date Value Ref Range Status  12/04/2019 93 >59 mL/min/1.73 Final         Passed - Valid encounter within last 6 months    Recent Outpatient Visits          1 month ago Diabetes mellitus without complication [facility])   [facility] Volney American, PA-C   4 months ago Diabetic peripheral neuropathy [facility])   [facility] Volney American, [place]   10 months ago Essential hypertension   [facility] [place], Jeannette How, MD   1 year ago Diabetes mellitus without complication [facility])   [place], Jeannette How, MD   1 year ago Diabetes mellitus without complication [facility])   [place], Jeannette How, MD      Future Appointments            In 2 months [name], NP [facility] [place] [facility], [place]   In 4 months [name], NP [facility], [place]

## 2020-05-23 ENCOUNTER — Other Ambulatory Visit: Payer: Self-pay | Admitting: Family Medicine

## 2020-05-23 DIAGNOSIS — E119 Type 2 diabetes mellitus without complications: Secondary | ICD-10-CM

## 2020-05-23 NOTE — Telephone Encounter (Signed)
Requested Prescriptions  Pending Prescriptions Disp Refills   metFORMIN (GLUCOPHAGE) 500 MG tablet [Pharmacy Med Name: metFORMIN HCL 500 MG TABLET] 360 tablet 1    Sig: [place] A DAY     Endocrinology:  Diabetes - Biguanides Passed - 05/23/2020 11:25 AM      Passed - Cr in normal range and within 360 days    Creatinine, Ser  Date Value Ref Range Status  12/04/2019 0.91 0.76 - 1.27 mg/dL Final         Passed - HBA1C is between 0 and 7.9 and within 180 days    Hemoglobin A1C  Date Value Ref Range Status  02/23/2016 6.9  Final   Hgb A1c MFr Bld  Date Value Ref Range Status  03/16/2020 7.4 (H) 4.8 - 5.6 % Final    Comment:             Prediabetes: 5.7 - 6.4          Diabetes: >6.4          Glycemic control for adults with diabetes: <7.0          Passed - eGFR in normal range and within 360 days    GFR calc Af Amer  Date Value Ref Range Status  12/04/2019 107 >59 mL/min/1.73 Final   GFR calc non Af Amer  Date Value Ref Range Status  12/04/2019 93 >59 mL/min/1.73 Final         Passed - Valid encounter within last 6 months    Recent Outpatient Visits          2 months ago Diabetes mellitus without complication [facility])   [facility] Volney American, PA-C   5 months ago Diabetic peripheral neuropathy [facility])   [facility] Volney American, [place]   11 months ago Essential hypertension   [place], Jeannette How, MD   1 year ago Diabetes mellitus without complication [facility])   [place], Jeannette How, MD   1 year ago Diabetes mellitus without complication [facility])   [place], Jeannette How, MD      Future Appointments            In 1 month [name], NP [place], [place]   In 3 months [name], NP [employer], [place]

## 2020-06-24 ENCOUNTER — Ambulatory Visit: Payer: BC Managed Care – PPO | Admitting: Family Medicine

## 2020-06-24 ENCOUNTER — Other Ambulatory Visit: Payer: BC Managed Care – PPO

## 2020-06-24 ENCOUNTER — Encounter: Payer: Self-pay | Admitting: Unknown Physician Specialty

## 2020-06-24 ENCOUNTER — Other Ambulatory Visit: Payer: Self-pay

## 2020-06-24 ENCOUNTER — Ambulatory Visit: Payer: BC Managed Care – PPO | Admitting: Unknown Physician Specialty

## 2020-06-24 DIAGNOSIS — E119 Type 2 diabetes mellitus without complications: Secondary | ICD-10-CM

## 2020-06-24 DIAGNOSIS — E7800 Pure hypercholesterolemia, unspecified: Secondary | ICD-10-CM

## 2020-06-24 DIAGNOSIS — I10 Essential (primary) hypertension: Secondary | ICD-10-CM

## 2020-06-24 MED ORDER — DAPAGLIFLOZIN PROPANEDIOL 10 MG PO TABS: 10.0000 mg | ORAL_TABLET | Freq: Every day | ORAL | 1 refills | Status: DC

## 2020-06-24 MED ORDER — METFORMIN HCL 500 MG PO TABS: 1000.0000 mg | ORAL_TABLET | Freq: Two times a day (BID) | ORAL | 1 refills | Status: DC

## 2020-06-24 MED ORDER — LISINOPRIL 10 MG PO TABS: 10.0000 mg | ORAL_TABLET | Freq: Every day | ORAL | 1 refills | Status: DC

## 2020-06-24 MED ORDER — ROSUVASTATIN CALCIUM 5 MG PO TABS: 5.0000 mg | ORAL_TABLET | Freq: Every day | ORAL | 1 refills | Status: DC

## 2020-06-24 NOTE — Assessment & Plan Note (Signed)
Stable, continue present medications.   

## 2020-06-24 NOTE — Progress Notes (Signed)
BP 129/79 (BP Location: Left Arm, Patient Position: Sitting, Cuff Size: Large)   Pulse 91   Temp 98.5 F (36.9 C) (Oral)   Ht 6\' 1"  (1.854 m)   Wt 274 lb 12.8 oz (124.6 kg)   BMI 36.26 kg/m    Subjective:    Patient ID: Dustin Fletcher, male    DOB: 1960-09-19, 59 y.o.   MRN: [id number]  HPI: Dustin Fletcher is a 59 y.o. male  Chief Complaint  Patient presents with  . Diabetes   Diabetes: Using medications without difficulties No hypoglycemic episodes No hyperglycemic episodes Feet problems: none Blood Sugars averaging: Not checking eye exam within last year Last Hgb A1C: 7.4%.    Hypertension  Using medications without difficulty Average home BPs Not checking   Using medication without problems or lightheadedness No chest pain with exertion or shortness of breath No Edema  Elevated Cholesterol Using medications without problems No Muscle aches    Relevant past medical, surgical, family and social history reviewed and updated as indicated. Interim medical history since our last visit reviewed. Allergies and medications reviewed and updated.  Review of Systems  Per HPI unless specifically indicated above     Objective:    BP 129/79 (BP Location: Left Arm, Patient Position: Sitting, Cuff Size: Large)   Pulse 91   Temp 98.5 F (36.9 C) (Oral)   Ht 6\' 1"  (1.854 m)   Wt 274 lb 12.8 oz (124.6 kg)   BMI 36.26 kg/m   Wt Readings from Last 3 Encounters:  06/24/20 274 lb 12.8 oz (124.6 kg)  03/19/20 271 lb (122.9 kg)  11/28/19 286 lb (129.7 kg)    Physical Exam Constitutional:      General: He is not in acute distress.    Appearance: Normal appearance. He is well-developed.  HENT:     Head: Normocephalic and atraumatic.  Eyes:     General: Lids are normal. No scleral icterus.       Right eye: No discharge.        Left eye: No discharge.     Conjunctiva/sclera: Conjunctivae normal.  Neck:     Vascular: No carotid bruit or JVD.  Cardiovascular:      Rate and Rhythm: Normal rate and regular rhythm.     Heart sounds: Normal heart sounds.  Pulmonary:     Effort: Pulmonary effort is normal. No respiratory distress.     Breath sounds: Normal breath sounds.  Abdominal:     Palpations: There is no hepatomegaly or splenomegaly.  Musculoskeletal:        General: Normal range of motion.     Cervical back: Normal range of motion and neck supple.  Skin:    General: Skin is warm and dry.     Coloration: Skin is not pale.     Findings: No rash.  Neurological:     Mental Status: He is alert and oriented to person, place, and time.  Psychiatric:        Behavior: Behavior normal.        Thought Content: Thought content normal.        Judgment: Judgment normal.     Results for orders placed or performed in visit on 03/16/20  HgB A1c  Result Value Ref Range   Hgb A1c MFr Bld 7.4 (H) 4.8 - 5.6 %   Est. average glucose Bld gHb Est-mCnc 166 mg/dL      Assessment & Plan:   Problem List Items Addressed This  Visit      Unprioritized   Diabetes mellitus without complication (HCC)    Stable, continue present medications.        Relevant Medications   rosuvastatin (CRESTOR) 5 MG tablet   metFORMIN (GLUCOPHAGE) 500 MG tablet   lisinopril (ZESTRIL) 10 MG tablet   dapagliflozin propanediol ([name]) 10 MG TABS tablet   Hyperlipidemia    Stable, continue present medications.        Relevant Medications   rosuvastatin (CRESTOR) 5 MG tablet   lisinopril (ZESTRIL) 10 MG tablet   Hypertension    Stable, continue present medications.        Relevant Medications   rosuvastatin (CRESTOR) 5 MG tablet   lisinopril (ZESTRIL) 10 MG tablet    Other Visit Diagnoses    Essential hypertension       Relevant Medications   rosuvastatin (CRESTOR) 5 MG tablet   lisinopril (ZESTRIL) 10 MG tablet       Follow up plan: Return in about 6 months (around 12/23/2020).

## 2020-06-25 LAB — HEMOGLOBIN A1C
Est. average glucose Bld gHb Est-mCnc: 163 mg/dL
Hgb A1c MFr Bld: 7.3 % — ABNORMAL HIGH (ref 4.8–5.6)

## 2020-06-25 LAB — LIPID PANEL W/O CHOL/HDL RATIO
Cholesterol, Total: 138 mg/dL (ref 100–199)
HDL: 37 mg/dL — ABNORMAL LOW (ref >39)
LDL Chol Calc (NIH): 59 mg/dL (ref 0–99)
Triglycerides: 263 mg/dL — ABNORMAL HIGH (ref 0–149)
VLDL Cholesterol Cal: 42 mg/dL — ABNORMAL HIGH (ref 5–40)

## 2020-06-25 LAB — COMPREHENSIVE METABOLIC PANEL
ALT: 15 IU/L (ref 0–44)
AST: 13 IU/L (ref 0–40)
Albumin/Globulin Ratio: 1.7 (ref 1.2–2.2)
Albumin: 4.7 g/dL (ref 3.8–4.9)
Alkaline Phosphatase: 76 IU/L (ref 44–121)
BUN/Creatinine Ratio: 15 (ref 9–20)
BUN: 13 mg/dL (ref 6–24)
Bilirubin Total: 0.4 mg/dL (ref 0.0–1.2)
CO2: 23 mmol/L (ref 20–29)
Calcium: 9.8 mg/dL (ref 8.7–10.2)
Chloride: 101 mmol/L (ref 96–106)
Creatinine, Ser: 0.88 mg/dL (ref 0.76–1.27)
GFR calc Af Amer: 109 mL/min/{1.73_m2} (ref >59)
GFR calc non Af Amer: 94 mL/min/{1.73_m2} (ref >59)
Globulin, Total: 2.7 g/dL (ref 1.5–4.5)
Glucose: 135 mg/dL — ABNORMAL HIGH (ref 65–99)
Potassium: 4.5 mmol/L (ref 3.5–5.2)
Sodium: 139 mmol/L (ref 134–144)
Total Protein: 7.4 g/dL (ref 6.0–8.5)

## 2020-06-28 ENCOUNTER — Encounter: Payer: Self-pay | Admitting: Unknown Physician Specialty

## 2020-07-08 ENCOUNTER — Other Ambulatory Visit: Payer: Self-pay | Admitting: Unknown Physician Specialty

## 2020-07-08 ENCOUNTER — Telehealth: Payer: Self-pay

## 2020-07-08 MED ORDER — SITAGLIPTIN PHOSPHATE 50 MG PO TABS: 50.0000 mg | ORAL_TABLET | Freq: Every day | ORAL | 1 refills | Status: DC

## 2020-07-08 NOTE — Telephone Encounter (Signed)
Copied from CRM [id number]. Topic: General - Other >> Jul 08, 2020  1:17 PM Wyonia Hough E wrote: Reason for CRM: Pt returned Cheryls call today about medication/ Pt asked if when she calls back it can be between 4:30 and 5pm/ please advise

## 2020-07-08 NOTE — Telephone Encounter (Signed)
Discussed with patient

## 2020-07-09 NOTE — Telephone Encounter (Signed)
Called to schedule, no answer, left vm 

## 2020-07-13 NOTE — Telephone Encounter (Signed)
Called to schedule, no answer, left vm 

## 2020-07-16 NOTE — Telephone Encounter (Signed)
scheduled

## 2020-08-13 ENCOUNTER — Ambulatory Visit: Payer: BC Managed Care – PPO | Admitting: Family

## 2020-08-13 ENCOUNTER — Encounter: Payer: Self-pay | Admitting: Family

## 2020-08-13 ENCOUNTER — Other Ambulatory Visit: Payer: Self-pay

## 2020-08-13 VITALS — BP 116/68 | HR 90 | Temp 98.0°F | Ht 74.0 in | Wt 274.1 lb

## 2020-08-13 DIAGNOSIS — Z125 Encounter for screening for malignant neoplasm of prostate: Secondary | ICD-10-CM | POA: Diagnosis not present

## 2020-08-13 DIAGNOSIS — E119 Type 2 diabetes mellitus without complications: Secondary | ICD-10-CM | POA: Diagnosis not present

## 2020-08-13 DIAGNOSIS — I10 Essential (primary) hypertension: Secondary | ICD-10-CM | POA: Diagnosis not present

## 2020-08-13 DIAGNOSIS — E7800 Pure hypercholesterolemia, unspecified: Secondary | ICD-10-CM

## 2020-08-13 NOTE — Assessment & Plan Note (Signed)
Controlled. Continue lisinopril 10mg 

## 2020-08-13 NOTE — Progress Notes (Signed)
Subjective:    Patient ID: Dustin Fletcher, male    DOB: [birth year], 59 y.o.   MRN: [id number]  CC: Dustin Fletcher is a 59 y.o. male who presents today to establish care.    HPI: Feels well today No complaints.  Prior pcp retired and he upcoming CPE with [facility]. He isnt sure but may keep that appointment.   HTN- compliant with lisinopril 10mg . No cp, sob.   DM- diagnosed 5-10 years ago. compliant with januvia 50mg , metformin 1000mg  BID, and farxiga 10mg . Last a1c 7.3.   HLD- compliant with crestor 5mg . LDL 59  Takes ASA 81mg  . No h/o GIB, CKD.   No trouble urinating. Has had PSA screened in the past.     HISTORY:  Past Medical History:  Diagnosis Date  . Bell's palsy   . Diabetes mellitus without complication (HCC)   . Hyperlipidemia   . Hypertension    Past Surgical History:  Procedure Laterality Date  . HERNIA REPAIR     Family History  Problem Relation Age of Onset  . Cancer Mother   . Cancer Father   . Diabetes Father   . Hypertension Father   . Heart disease Brother   . Hypertension Brother     Allergies: Patient has no known allergies. Current Outpatient Medications on File Prior to Visit  Medication Sig Dispense Refill  . aspirin EC 81 MG tablet Take 81 mg by mouth daily.    . Cholecalciferol (VITAMIN D3) 2000 UNITS TABS Take 4,000 Units by mouth 2 (two) times daily.    . dapagliflozin propanediol ([name]) 10 MG TABS tablet Take 1 tablet (10 mg total) by mouth daily. 90 tablet 1  . ELDERBERRY PO Take by mouth.    ibuprofen (ADVIL,MOTRIN) 800 MG tablet Take 800 mg by mouth 2 (two) times daily.    lisinopril (ZESTRIL) 10 MG tablet Take 1 tablet (10 mg total) by mouth daily. 90 tablet 1  . metFORMIN (GLUCOPHAGE) 500 MG tablet Take 2 tablets (1,000 mg total) by mouth 2 (two) times daily. 360 tablet 1  . Omega-3 Fatty Acids (FISH OIL) 1000 MG CAPS Take by mouth.    . rosuvastatin (CRESTOR) 5 MG tablet Take 1 tablet (5 mg total)  by mouth daily. 90 tablet 1  . sitaGLIPtin (JANUVIA) 50 MG tablet Take 1 tablet (50 mg total) by mouth daily. 90 tablet 1  . vitamin B-12 (CYANOCOBALAMIN) 1000 MCG tablet Take 1,000 mcg by mouth daily.     No current facility-administered medications on file prior to visit.    Social History   Tobacco Use  . Smoking status: Former Smoker    Types: Cigarettes    Quit date: 05/31/1981    Years since quitting: 39.2  . Smokeless tobacco: Never Used  Vaping Use  . Vaping Use: Never used  Substance Use Topics  . Alcohol use: No    Alcohol/week: 0.0 standard drinks  . Drug use: No    Review of Systems  Constitutional: Negative for chills and fever.  Respiratory: Negative for cough.   Cardiovascular: Negative for chest pain and palpitations.  Gastrointestinal: Negative for nausea and vomiting.      Objective:    BP 116/68   Pulse 90   Temp 98 F (36.7 C)   Ht 6\' 2"  (1.88 m)   Wt 274 lb 1.6 oz (124.3 kg)   SpO2 98%   BMI 35.19 kg/m  BP Readings from Last 3 Encounters:  08/13/20 116/68  06/24/20 129/79  03/19/20 114/78   Wt Readings from Last 3 Encounters:  08/13/20 274 lb 1.6 oz (124.3 kg)  06/24/20 274 lb 12.8 oz (124.6 kg)  03/19/20 271 lb (122.9 kg)    Physical Exam Vitals reviewed.  Constitutional:      Appearance: He is well-developed.  Cardiovascular:     Rate and Rhythm: Regular rhythm.     Heart sounds: Normal heart sounds.  Pulmonary:     Effort: Pulmonary effort is normal. No respiratory distress.     Breath sounds: Normal breath sounds. No wheezing, rhonchi or rales.  Skin:    General: Skin is warm and dry.  Neurological:     Mental Status: He is alert.  Psychiatric:        Speech: Speech normal.        Behavior: Behavior normal.        Assessment & Plan:   Problem List Items Addressed This Visit      Cardiovascular and Mediastinum   Hypertension    Controlled. Continue lisinopril 10mg         Endocrine   Diabetes mellitus without  complication (HCC) - Primary    Uncontrolled. Advised to repeat a1c. Pending. Continue januvia 50mg , metformin 1000mg bid, and farxiga 10mg .       Relevant Orders   Hemoglobin A1c     Other   Hyperlipidemia    Stable. Continue crestor 5mg        Other Visit Diagnoses    Screening for prostate cancer       Relevant Orders   PSA       I am having Armand C. " maintain his Vitamin D3, aspirin EC, vitamin B-12, Fish Oil, ibuprofen, ELDERBERRY PO, rosuvastatin, metFORMIN, lisinopril, dapagliflozin propanediol, and sitaGLIPtin.   No orders of the defined types were placed in this encounter.   Return precautions given.   Risks, benefits, and alternatives of the medications and treatment plan prescribed today were discussed, and patient expressed understanding.   Education regarding symptom management and diagnosis given to patient on AVS.  Continue to follow with , [name] for routine health maintenance.   and I agreed with plan.   , [name]

## 2020-08-13 NOTE — Assessment & Plan Note (Signed)
Uncontrolled. Advised to repeat a1c. Pending. Continue januvia 50mg , metformin 1000mg bid, and farxiga 10mg .

## 2020-08-13 NOTE — Assessment & Plan Note (Signed)
Stable. Continue crestor 5mg °

## 2020-08-13 NOTE — Patient Instructions (Addendum)
Very nice to meet you.  Have a1c, psa lab done at [facility] after January 14th.

## 2020-09-02 ENCOUNTER — Encounter: Payer: BC Managed Care – PPO | Admitting: Unknown Physician Specialty

## 2020-09-02 ENCOUNTER — Encounter: Payer: BC Managed Care – PPO | Admitting: Family Medicine

## 2020-09-02 ENCOUNTER — Other Ambulatory Visit: Payer: Self-pay

## 2020-09-16 ENCOUNTER — Other Ambulatory Visit: Payer: BC Managed Care – PPO

## 2020-09-16 ENCOUNTER — Other Ambulatory Visit: Payer: Self-pay

## 2020-09-16 DIAGNOSIS — Z125 Encounter for screening for malignant neoplasm of prostate: Secondary | ICD-10-CM

## 2020-09-16 DIAGNOSIS — E119 Type 2 diabetes mellitus without complications: Secondary | ICD-10-CM

## 2020-09-17 ENCOUNTER — Encounter: Payer: Self-pay | Admitting: Family

## 2020-09-17 LAB — HEMOGLOBIN A1C
Est. average glucose Bld gHb Est-mCnc: 169 mg/dL
Hgb A1c MFr Bld: 7.5 % — ABNORMAL HIGH (ref 4.8–5.6)

## 2020-09-17 LAB — PSA: Prostate Specific Ag, Serum: 0.3 ng/mL (ref 0.0–4.0)

## 2020-09-20 ENCOUNTER — Other Ambulatory Visit: Payer: Self-pay | Admitting: Family

## 2020-09-20 DIAGNOSIS — E119 Type 2 diabetes mellitus without complications: Secondary | ICD-10-CM

## 2020-09-20 MED ORDER — SITAGLIPTIN PHOSPHATE 100 MG PO TABS: 100.0000 mg | ORAL_TABLET | Freq: Every day | ORAL | 1 refills | Status: DC

## 2020-09-29 ENCOUNTER — Encounter: Payer: Self-pay | Admitting: Family

## 2020-10-08 ENCOUNTER — Other Ambulatory Visit: Payer: Self-pay | Admitting: Family

## 2020-10-08 ENCOUNTER — Encounter: Payer: Self-pay | Admitting: Family

## 2020-10-08 ENCOUNTER — Telehealth: Payer: Self-pay

## 2020-10-08 DIAGNOSIS — E119 Type 2 diabetes mellitus without complications: Secondary | ICD-10-CM

## 2020-10-08 NOTE — Chronic Care Management (AMB) (Signed)
  Care Management   Note  10/08/2020 Name: JAMILLE YOSHINO MRN: [id number] DOB: 08-31-1961  MOLLY MASELLI is a 60 y.o. year old male who is a primary care patient of Allegra Grana, [name]. I reached out to Ocie Doyne by phone today in response to a referral sent by Mr. Calden Dorsey [facility] PCP Rennie Plowman, [name] .    Mr. Brusca was given information about care management services today including:  1. Care management services include personalized support from designated clinical staff supervised by his physician, including individualized plan of care and coordination with other care providers 2. 24/7 contact phone numbers for assistance for urgent and routine care needs. 3. The patient may stop care management services at any time by phone call to the office staff.  Patient agreed to services and verbal consent obtained.   Follow up plan: Telephone appointment with care management team member scheduled for:10/25/2020  Penne Lash, RMA Care Guide, Embedded Care Coordination [facility]  [place], [place] [address] Direct Dial: [phone] [email or web address]

## 2020-10-14 ENCOUNTER — Ambulatory Visit: Payer: BC Managed Care – PPO | Admitting: Pharmacist

## 2020-10-14 DIAGNOSIS — E7800 Pure hypercholesterolemia, unspecified: Secondary | ICD-10-CM

## 2020-10-14 DIAGNOSIS — E119 Type 2 diabetes mellitus without complications: Secondary | ICD-10-CM

## 2020-10-14 DIAGNOSIS — I10 Essential (primary) hypertension: Secondary | ICD-10-CM

## 2020-10-14 NOTE — Patient Instructions (Addendum)
Dustin Fletcher,   It was great talking with you today!  STOP JANUVIA. Start [name] 3 mg daily. Take this medication first thing in the morning on an empty stomach. Wait at least 30 minutes before other medications or food.   Continue metformin 1000 mg twice daily and [name] 10 mg daily. Let me know when you get close to finishing your supply of metformin and we can switch to the combination (Xigduo - metformin + [name])  [name] savings card website: YellowShoppers.it Xigduo savings card website: WikiBlast.com.cy.html [name] savings card website: https://www.rybelsus.com/savings-and-support.html  Our goal A1c is <7%. This corresponds with fasting sugars <130 and 2 hour after meal sugars <180. Please periodically check some glucose readings throughout the week and write down these readings. This will help [place] evaluate the impact of the addition of [name], and figure out if we need to increase to the next highest dose.   Call me with any questions or concerns!  Dustin Fletcher, PharmD [phone]  Visit Information  PATIENT GOALS:  Goals Addressed              This Visit's Progress     Patient Stated   .  Medication Monitoring (pt-stated)        Patient Goals/Self-Care Activities . Over the next 90 days, patient will:  - take medications as prescribed check glucose daily, document, and provide at future appointments       Dustin Fletcher was given information about Care Management services today including:  1. Care Management services include personalized support from designated clinical staff supervised by his physician, including individualized plan of care and coordination with other care providers 2. 24/7 contact phone numbers for assistance for urgent and routine care needs. 3. The patient may stop CCM services at any time (effective at the end of the month) by phone call to the office staff.  Patient agreed to  services and verbal consent obtained.   Patient verbalizes understanding of instructions provided today and agrees to view in MyChart.     Plan: Telephone follow up appointment with care management team member scheduled for:  ~8 weeks  Dustin Fletcher, PharmD, [place], CPP Clinical Pharmacist [employer] at [employer] [phone]

## 2020-10-14 NOTE — Chronic Care Management (AMB) (Addendum)
Care Management   Pharmacy Note  10/14/2020 Name: Dustin Fletcher MRN: [id number] DOB: 1961/07/17  Subjective: Dustin Fletcher is a 60 y.o. year old male who is a primary care patient of Allegra Grana, [name]. The Care Management team was consulted for assistance with care management and care coordination needs.    Engaged with patient by telephone for initial visit in response to provider referral for pharmacy case management and/or care coordination services.   The patient was given information about Care Management services today including:  1. Care Management services includes personalized support from designated clinical staff supervised by the patient's primary care provider, including individualized plan of care and coordination with other care providers. 2. 24/7 contact phone numbers for assistance for urgent and routine care needs. 3. The patient may stop case management services at any time by phone call to the office staff.  Patient agreed to services and consent obtained.  Assessment:  Review of patient status, including review of consultants reports, laboratory and other test data, was performed as part of comprehensive evaluation and provision of chronic care management services.   SDOH (Social Determinants of Health) assessments and interventions performed:  SDOH Interventions   Flowsheet Row Most Recent Value  SDOH Interventions   Financial Strain Interventions Other (Comment)  [discussed manufacturer savings cards]       Objective:  Lab Results  Component Value Date   CREATININE 0.88 06/24/2020   CREATININE 0.91 12/04/2019   CREATININE 0.96 06/05/2019    Lab Results  Component Value Date   HGBA1C 7.5 (H) 09/16/2020       Component Value Date/Time   CHOL 138 06/24/2020 0759   TRIG 263 (H) 06/24/2020 0759   HDL 37 (L) 06/24/2020 0759   CHOLHDL 3.7 06/05/2019 0744   LDLCALC 59 06/24/2020 0759     Clinical ASCVD: No  The 10-year ASCVD risk  score Denman George DC Jr., et al., 2013) is: 12.8%   Values used to calculate the score:     Age: 8 years     Sex: Male     Is Non-Hispanic African American: No     Diabetic: Yes     Tobacco smoker: No     Systolic Blood Pressure: 116 mmHg     Is BP treated: Yes     HDL Cholesterol: 37 mg/dL     Total Cholesterol: 138 mg/dL    Other: ([id number] if Afib, PHQ9 if depression, MMRC or CAT for COPD, ACT, DEXA)  BP Readings from Last 3 Encounters:  08/13/20 116/68  06/24/20 129/79  03/19/20 114/78    Care Plan  No Known Allergies  Medications Reviewed Today    Reviewed by Dustin Fletcher, Dustin Fletcher (Pharmacist) on 10/14/20 at 1505  Med List Status: <None>  Medication Order Taking? Sig Documenting Provider Last Dose Status Informant  aspirin EC 81 MG tablet [id number] Yes Take 162 mg by mouth daily. [provider] Taking Active   Cholecalciferol (VITAMIN D3) 2000 UNITS TABS [id number] Yes Take 4,000 Units by mouth 2 (two) times daily. [provider] Taking Active   dapagliflozin propanediol ([name]) 10 MG TABS tablet [id number] Yes Take 1 tablet (10 mg total) by mouth daily. Gabriel Cirri, Dustin Fletcher Taking Active   ELDERBERRY PO [id number] Yes Take by mouth. [provider] Taking Active   ibuprofen (ADVIL,MOTRIN) 800 MG tablet [id number] Yes Take 800 mg by mouth 2 (two) times daily. [provider] Taking Active  Med Note Tobey Bride, [facility]   Fri Aug 13, 2020 11:07 AM) PRN  lisinopril (ZESTRIL) 10 MG tablet [id number] Yes Take 1 tablet (10 mg total) by mouth daily. Gabriel Cirri, Dustin Fletcher Taking Active   metFORMIN (GLUCOPHAGE) 500 MG tablet [id number] Yes Take 2 tablets (1,000 mg total) by mouth 2 (two) times daily. Gabriel Cirri, Dustin Fletcher Taking Active   Omega-3 Fatty Acids (FISH OIL) 1000 MG CAPS [id number] Yes Take by mouth. [provider] Taking Active            Med Note Tyrone Nine Oct 14, 2020  2:59 PM)    rosuvastatin (CRESTOR) 5 MG  tablet [id number] Yes Take 1 tablet (5 mg total) by mouth daily. Gabriel Cirri, Dustin Fletcher Taking Active   sitaGLIPtin (JANUVIA) 100 MG tablet [id number] No Take 1 tablet (100 mg total) by mouth daily.  Patient not taking: Reported on 10/14/2020   Allegra Grana, [name] Not Taking Active   vitamin B-12 (CYANOCOBALAMIN) 1000 MCG tablet [id number] Yes Take 1,000 mcg by mouth daily. [provider] Taking Active           Patient Active Problem List   Diagnosis Date Noted  . History of total knee arthroplasty 12/10/2018  . Osteoarthritis of right knee 11/26/2018  . Diabetic peripheral neuropathy (HCC) 07/23/2017  . 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 Hypertension   . Diabetes mellitus without complication (HCC)   . Hyperlipidemia     Conditions to be addressed/monitored: HTN, HLD and DMII  Care Plan : Medication Management  Updates made by Dustin Fletcher, Dustin Fletcher since 10/14/2020 12:00 AM    Problem: Diabetes, Hypertension, Hyperlipidemia     Long-Range Goal: Disease Prevention Progression   Start Date: 10/14/2020  This Visit's Progress: On track  Priority: High  Note:   Current Barriers:  . Unable to independently afford treatment regimen . Unable to achieve control of diabetes   Pharmacist Clinical Goal(s):  Marland Kitchen Over the next 90 days, patient will achieve control of diabetes as evidenced by A1c  through collaboration with PharmD and provider.   Interventions: . 1:1 collaboration with Allegra Grana, [name] regarding development and update of comprehensive plan of care as evidenced by provider attestation and co-signature . Inter-disciplinary care team collaboration (see longitudinal plan of care) . Comprehensive medication review performed; medication list updated in electronic medical record  Diabetes: . Uncontrolled; current treatment: metformin 1000 mg BID, [name] 10 mg daily, Januvia 100 mg daily - unable to afford o Reports previously prescribed Ozempic, but did not take and is  not interested in injections  . Current glucose readings: not checking at this time, needs a new script.  . Reviewed goal A1c, goal fasting, and goal 2 hour post prandial glucose.  . Reviewed concepts of manufacturer savings cards to use in combination with commercial insurance.  . Discussed combining [name] + metformin into Xigduo for reduced pill burden. Patient has a significant supply of metformin at this time, so will hold off for now.  . Discussed GLP1 vs DPP4 and greater efficacy. Discussed [name], including mechanism of action, side effects. Patient is interested. Recommend starting [name] 3 mg daily. Take first thing in the morning on an empty stomach. Continue [name] 10 mg daily and metformin 1000 mg BID. Will collaborate w/ PCP Dustin Fletcher on this recommendation.  . Patient requests new script for glucometer + strips + lancets as well as [name] refill.  Hypertension: . Controlled per last clinic reading; current treatment: lisinopril 10  mg daily . Recommended to continue current regimen at this time  Hyperlipidemia and ASCVD risk reduction: . Controlled; current treatment: rosuvastatin 5 mg daily, OTC omega 3 fatty acids . Antiplatelet regimen: aspirin 162 mg daily, reports he was previously recommended to take this dose.  Marland Kitchen Recommend patient reduce aspirin to 81 mg daily for primary prevention.  . Recommended to continue rosuvastatin 5 mg daily and OTC omega 3 at this time  Supplements: Marland Kitchen Vitamin D, vitamin B12    Patient Goals/Self-Care Activities . Over the next 90 days, patient will:  - take medications as prescribed check glucose daily, document, and provide at future appointments  Follow Up Plan: Telephone follow up appointment with care management team member scheduled for: ~ 8 weeks (PCP f/u in 4)      Medication Assistance:  Education regarding manufacturer savings cards  Follow Up:  Patient agrees to Care Plan and Follow-up.  Plan: Telephone follow  up appointment with care management team member scheduled for:  ~8 weeks  Catie Feliz Beam, PharmD, [place], CPP Clinical Pharmacist [facility] [phone]  I have collaborated with the care management provider regarding care management and care coordination activities outlined in this encounter and have reviewed this encounter including documentation in the note and care plan. I am certifying that I agree with the content of this note and encounter as primary care provider.   Dustin Plowman, Dustin Fletcher

## 2020-10-15 ENCOUNTER — Telehealth: Payer: Self-pay | Admitting: Pharmacist

## 2020-10-15 ENCOUNTER — Telehealth: Payer: Self-pay | Admitting: Family

## 2020-10-15 DIAGNOSIS — E119 Type 2 diabetes mellitus without complications: Secondary | ICD-10-CM

## 2020-10-15 MED ORDER — RYBELSUS 3 MG PO TABS: 3.0000 mg | ORAL_TABLET | Freq: Every day | ORAL | 1 refills | Status: DC

## 2020-10-15 MED ORDER — BLOOD GLUCOSE MONITOR KIT: PACK | 0 refills | Status: DC

## 2020-10-15 MED ORDER — DAPAGLIFLOZIN PROPANEDIOL 10 MG PO TABS: 10.0000 mg | ORAL_TABLET | Freq: Every day | ORAL | 1 refills | Status: DC

## 2020-10-15 NOTE — Telephone Encounter (Signed)
I called and spoke with the patient's wife and she stated the patient does not have any family history of thyroid cancer at all.  I also ordered the glucometer kit that has the lancets and the strips as well and faxed it to the pharmacy for the patient.  Amaranta Mehl,cma

## 2020-10-15 NOTE — Telephone Encounter (Signed)
Call pt I agree with Catie ( pharmD) and addition of [name] 3 mg daily as well  patient reduce aspirin to 81 mg daily for primary prevention.   Please DOUBLE check NO personal or family h/o thyroid cancer as he cannot be on [name] if so and add to negative family history IF NEGATIVE.  Please refill DM supplies - he needs glucometer, lancets, strips

## 2020-10-15 NOTE — Telephone Encounter (Signed)
Sample was picked up 

## 2020-10-15 NOTE — Telephone Encounter (Signed)
Medication Samples have been labeled and placed at the front desk for patient to pick up at his convenience  Drug: [name] Strength: 3 mg Qty: 1 box  LOT: [id number]              Exp.Date: 04/2022

## 2020-10-19 ENCOUNTER — Ambulatory Visit: Payer: BC Managed Care – PPO | Admitting: Family Medicine

## 2020-10-25 ENCOUNTER — Telehealth: Payer: BC Managed Care – PPO

## 2020-11-17 ENCOUNTER — Telehealth: Payer: Self-pay

## 2020-11-17 ENCOUNTER — Other Ambulatory Visit: Payer: Self-pay

## 2020-11-17 ENCOUNTER — Ambulatory Visit: Payer: BC Managed Care – PPO | Admitting: Family

## 2020-11-17 ENCOUNTER — Encounter: Payer: Self-pay | Admitting: Family

## 2020-11-17 DIAGNOSIS — E119 Type 2 diabetes mellitus without complications: Secondary | ICD-10-CM | POA: Diagnosis not present

## 2020-11-17 DIAGNOSIS — E7800 Pure hypercholesterolemia, unspecified: Secondary | ICD-10-CM

## 2020-11-17 DIAGNOSIS — I10 Essential (primary) hypertension: Secondary | ICD-10-CM

## 2020-11-17 MED ORDER — RYBELSUS 7 MG PO TABS: 7.0000 mg | ORAL_TABLET | Freq: Every day | ORAL | 3 refills | Status: DC

## 2020-11-17 NOTE — Assessment & Plan Note (Signed)
Controlled. Continue crestor 5mg 

## 2020-11-17 NOTE — Patient Instructions (Addendum)
Increase rybelsus. a1c lab in 6 weeks   Nice to see you!

## 2020-11-17 NOTE — Progress Notes (Signed)
Subjective:    Patient ID: Dustin Fletcher, male    DOB: 08-04-1961, 60 y.o.   MRN: [id number]  CC: Dustin Fletcher is a 60 y.o. male who presents today for follow up.   HPI: Feels well today  No complaints  DM- restarted rybelsus 74m last month. FGB started at 170 and has decreased to 132. Average close to 140. Tolerating medication and denies nausea.  Compliant with metformin 10049mid, and farxiga 1075m Compliant with 73m64ma  HLD- compliant with crestor 5mg 62mN- compliant with lisinopril 10mg 95mHISTORY:  Past Medical History:  Diagnosis Date  . Bell's palsy   . Diabetes mellitus without complication (HCC)  [place]lipidemia   . Hypertension    Past Surgical History:  Procedure Laterality Date  . HERNIA REPAIR     Family History  Problem Relation Age of Onset  . Cancer Mother   . Cancer Father   . Diabetes Father   . Hypertension Father   . Heart disease Brother   . Hypertension Brother     Allergies: Patient has no known allergies. Current Outpatient Medications on File Prior to Visit  Medication Sig Dispense Refill  . aspirin EC 81 MG tablet Take 162 mg by mouth daily.    . blood glucose meter kit and supplies KIT Dispense based on patient and insurance preference. Use up to four times daily as directed. (FOR ICD-9 250.00, 250.01). 1 each 0  . Cholecalciferol (VITAMIN D3) 2000 UNITS TABS Take 4,000 Units by mouth 2 (two) times daily.    . dapagliflozin propanediol ([name]) 10 MG TABS tablet Take 1 tablet (10 mg total) by mouth daily. 90 tablet 1  . ELDERBERRY PO Take by mouth.    . ibupMarland Kitchenofen (ADVIL,MOTRIN) 800 MG tablet Take 800 mg by mouth 2 (two) times daily.    . lisiMarland Kitchenopril (ZESTRIL) 10 MG tablet Take 1 tablet (10 mg total) by mouth daily. 90 tablet 1  . metFORMIN (GLUCOPHAGE) 500 MG tablet Take 2 tablets (1,000 mg total) by mouth 2 (two) times daily. 360 tablet 1  . Omega-3 Fatty Acids (FISH OIL) 1000 MG CAPS Take by mouth.    .  rosuvastatin (CRESTOR) 5 MG tablet Take 1 tablet (5 mg total) by mouth daily. 90 tablet 1  . vitamin B-12 (CYANOCOBALAMIN) 1000 MCG tablet Take 1,000 mcg by mouth daily.     No current facility-administered medications on file prior to visit.    Social History   Tobacco Use  . Smoking status: Former Smoker    Types: Cigarettes    Quit date: 05/31/1981    Years since quitting: 39.4  . Smokeless tobacco: Never Used  Vaping Use  . Vaping Use: Never used  Substance Use Topics  . Alcohol use: No    Alcohol/week: 0.0 standard drinks  . Drug use: No    Review of Systems  Constitutional: Negative for chills and fever.  Respiratory: Negative for cough.   Cardiovascular: Negative for chest pain and palpitations.  Gastrointestinal: Negative for nausea and vomiting.      Objective:    BP 112/68   Pulse 86   Temp 97.8 F (36.6 C)   Ht '6\' 2"'  (1.88 m)   Wt 274 lb 9.6 oz (124.6 kg)   SpO2 98%   BMI 35.26 kg/m  BP Readings from Last 3 Encounters:  11/17/20 112/68  08/13/20 116/68  06/24/20 129/79   Wt Readings from Last 3 Encounters:  11/17/20  274 lb 9.6 oz (124.6 kg)  08/13/20 274 lb 1.6 oz (124.3 kg)  06/24/20 274 lb 12.8 oz (124.6 kg)    Physical Exam Vitals reviewed.  Constitutional:      Appearance: He is well-developed and well-nourished.  Cardiovascular:     Rate and Rhythm: Regular rhythm.     Heart sounds: Normal heart sounds.  Pulmonary:     Effort: Pulmonary effort is normal. No respiratory distress.     Breath sounds: Normal breath sounds. No wheezing, rhonchi or rales.  Skin:    General: Skin is warm and dry.  Neurological:     Mental Status: He is alert.  Psychiatric:        Mood and Affect: Mood and affect normal.        Speech: Speech normal.        Behavior: Behavior normal.        Assessment & Plan:   Problem List Items Addressed This Visit      Cardiovascular and Mediastinum   Hypertension    Stable. Continue lisinopril 9m         Endocrine   Diabetes mellitus without complication (HCC)    Improved. Increase rybelsus to 776m Continue metformin 100033md, and farxiga 73m34m     Relevant Medications   Semaglutide ([name]) 7 MG TABS   Other Relevant Orders   Hemoglobin A1c     Other   Hyperlipidemia    Controlled. Continue crestor 5mg 36m      I have discontinued MichaShadeeilliams "[name]"'s [name]. I am also having him start on [name]. Additionally, I am having him maintain his Vitamin D3, aspirin EC, vitamin B-12, Fish Oil, ibuprofen, ELDERBERRY PO, rosuvastatin, metFORMIN, lisinopril, dapagliflozin propanediol, and blood glucose meter kit and supplies.   Meds ordered this encounter  Medications  . Semaglutide ([name]) 7 MG TABS    Sig: Take 7 mg by mouth daily.    Dispense:  90 tablet    Refill:  3    Order Specific Question:   Supervising Provider    Answer:   TULLOCrecencio [id number]]    Return precautions given.   Risks, benefits, and alternatives of the medications and treatment plan prescribed today were discussed, and patient expressed understanding.   Education regarding symptom management and diagnosis given to patient on AVS.  Continue to follow with ArnetBurnard Hawthorne for routine health maintenance.   MichaDonia PoundsI agreed with plan.   MargaMable Paris

## 2020-11-17 NOTE — Telephone Encounter (Signed)
LMTCB or to come back by since at appointment it was forgotten to give to him order for a1c to be done at [facility]. I have placed in my patient paperwork folder at my desk.

## 2020-11-17 NOTE — Assessment & Plan Note (Signed)
Improved. Increase rybelsus to 7mg . Continue metformin 1000mg bid, and farxiga 10mg .

## 2020-11-17 NOTE — Telephone Encounter (Signed)
Close  

## 2020-11-17 NOTE — Assessment & Plan Note (Addendum)
Stable. Continue lisinopril 10mg 

## 2020-11-17 NOTE — Telephone Encounter (Signed)
Pt called back & will pick up at pur front desk. I have placed in folder upfront.

## 2020-12-16 ENCOUNTER — Ambulatory Visit: Payer: BC Managed Care – PPO | Admitting: Pharmacist

## 2020-12-16 DIAGNOSIS — E119 Type 2 diabetes mellitus without complications: Secondary | ICD-10-CM

## 2020-12-16 NOTE — Chronic Care Management (AMB) (Addendum)
**Note Dustin-Identified via Obfuscation** Care Management   Pharmacy Note  12/16/2020 Name: Dustin Fletcher MRN: [id number] DOB: 01-02-1961  Subjective: Dustin Fletcher is a 60 y.o. year old male who is a primary care patient of Dustin Hawthorne, [name]. The Care Management team was consulted for assistance with care management and care coordination needs.    Engaged with patient by telephone for follow up visit in response to provider referral for pharmacy case management and/or care coordination services.   The patient was given information about Care Management services today including:  1. Care Management services includes personalized support from designated clinical staff supervised by the patient's primary care provider, including individualized plan of care and coordination with other care providers. 2. 24/7 contact phone numbers for assistance for urgent and routine care needs. 3. The patient may stop case management services at any time by phone call to the office staff.  Patient agreed to services and consent obtained.  Assessment:  Review of patient status, including review of consultants reports, laboratory and other test data, was performed as part of comprehensive evaluation and provision of chronic care management services.   SDOH (Social Determinants of Health) assessments and interventions performed:  SDOH Interventions   Flowsheet Row Most Recent Value  SDOH Interventions   Financial Strain Interventions Intervention Not Indicated       Objective:  Lab Results  Component Value Date   CREATININE 0.88 06/24/2020   CREATININE 0.91 12/04/2019   CREATININE 0.96 06/05/2019    Lab Results  Component Value Date   HGBA1C 7.5 (H) 09/16/2020       Component Value Date/Time   CHOL 138 06/24/2020 0759   TRIG 263 (H) 06/24/2020 0759   HDL 37 (L) 06/24/2020 0759   CHOLHDL 3.7 06/05/2019 0744   LDLCALC 59 06/24/2020 0759    Clinical ASCVD: No  The 10-year ASCVD risk score Dustin Bussing DC Jr., et al., 2013)  is: 12.1%   Values used to calculate the score:     Age: 32 years     Sex: Male     Is Non-Hispanic African American: No     Diabetic: Yes     Tobacco smoker: No     Systolic Blood Pressure: 191 mmHg     Is BP treated: Yes     HDL Cholesterol: 37 mg/dL     Total Cholesterol: 138 mg/dL     BP Readings from Last 3 Encounters:  11/17/20 112/68  08/13/20 116/68  06/24/20 129/79    Care Plan  No Known Allergies  Medications Reviewed Today    Reviewed by Dustin Fletcher, Dustin Fletcher (Pharmacist) on 12/16/20 at 1533  Med List Status: <None>  Medication Order Taking? Sig Documenting Provider Last Dose Status Informant  aspirin EC 81 MG tablet [id number] Yes Take 162 mg by mouth daily. [provider] Taking Active   blood glucose meter kit and supplies KIT [id number] Yes Dispense based on patient and insurance preference. Use up to four times daily as directed. (FOR ICD-9 250.00, 250.01). Dustin Hawthorne, [name] Taking Active   Cholecalciferol (VITAMIN D3) 2000 UNITS TABS [id number] Yes Take 4,000 Units by mouth 2 (two) times daily. [provider] Taking Active   dapagliflozin propanediol ([name]) 10 MG TABS tablet [id number] Yes Take 1 tablet (10 mg total) by mouth daily. Dustin Hawthorne, [name] Taking Active   ELDERBERRY PO [id number] Yes Take by mouth. [provider] Taking Active   ibuprofen (ADVIL,Dustin Fletcher) 800 MG tablet [id number] Yes Take 800 mg by  mouth 2 (two) times daily. [provider] Taking Active            Med Note Dustin Fletcher, Dustin Fletcher   Fri Aug 13, 2020 11:07 AM) PRN  lisinopril (ZESTRIL) 10 MG tablet [id number] Yes Take 1 tablet (10 mg total) by mouth daily. Dustin Haddock, NP Taking Active   metFORMIN (GLUCOPHAGE) 500 MG tablet [id number] Yes Take 2 tablets (1,000 mg total) by mouth 2 (two) times daily. Dustin Haddock, NP Taking Active   Omega-3 Fatty Acids (FISH OIL) 1000 MG CAPS [id number] Yes Take by mouth. [provider] Taking Active             Med Note Dustin Fletcher Oct 14, 2020  2:59 PM)    rosuvastatin (CRESTOR) 5 MG tablet [id number] Yes Take 1 tablet (5 mg total) by mouth daily. Dustin Haddock, NP Taking Active   Semaglutide (Dustin Fletcher) 7 MG TABS [id number] Yes Take 7 mg by mouth daily. Dustin Hawthorne, [name] Taking Active   vitamin B-12 (CYANOCOBALAMIN) 1000 MCG tablet [id number] Yes Take 1,000 mcg by mouth daily. [provider] Taking Active           Patient Active Problem List   Diagnosis Date Noted  . History of total knee arthroplasty 12/10/2018  . Osteoarthritis of right knee 11/26/2018  . Diabetic peripheral neuropathy ([place]) 07/23/2017  . 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 Hypertension   . Diabetes mellitus without complication ([place])   . Hyperlipidemia     Conditions to be addressed/monitored: HTN, HLD and DMII  Care Plan : Medication Management  Updates made by Dustin Fletcher, Dustin Fletcher since 12/16/2020 12:00 AM    Problem: Diabetes, Hypertension, Hyperlipidemia     Long-Range Goal: Disease Prevention Progression   Start Date: 10/14/2020  This Visit's Progress: On track  Recent Progress: On track  Priority: High  Note:   Current Barriers:  . Unable to independently afford treatment regimen . Unable to achieve control of diabetes   Pharmacist Clinical Goal(s):  Marland Kitchen Over the next 90 days, patient will achieve control of diabetes as evidenced by A1c  through collaboration with PharmD and provider.   Interventions: . 1:1 collaboration with Dustin Hawthorne, [name] regarding development and update of comprehensive plan of care as evidenced by provider attestation and co-signature . Inter-disciplinary care team collaboration (see longitudinal plan of care) . Comprehensive medication review performed; medication list updated in electronic medical record  Diabetes: . Uncontrolled; current treatment: metformin 1000 mg BID, [name] 10 mg daily, Dustin Fletcher 7 mg QAM . Confirms appropriate  administration technique for Dustin Fletcher. He takes first thing in the morning on an empty stomach and waits 30 minutes prior to other medications or food. . Denies any change in appetite, any GI upset since starting Dustin Fletcher and no change since increasing dose.  . Current glucose readings: fastings remain in the ~140-150s. No change in glucose since increasing Dustin Fletcher dose.  Marland Kitchen Recommend to maximize Dustin Fletcher to 14 mg daily. Patient can take 2 of the 7 mg tablets at once. If no increased glycemic benefit with maximal dose, recommend discontinuation. Discussed that I would recommend use of injectable Ozempic at that point. Patient will contemplate.  . Patient wants to consolidate [place] and metformin as we previously discussed. Will collaborate w/ PCP to order Xigduo XR 01/999 mg, take 2 tablets QAM.   Hypertension: . Controlled per last clinic reading; current treatment: lisinopril 10 mg daily . Recommended to continue current regimen at this time  Hyperlipidemia and ASCVD risk reduction: . Controlled; current treatment: rosuvastatin 5 mg daily, OTC omega 3 fatty acids . Antiplatelet regimen: aspirin 81 mg daily . Recommend to continue current regimen at this time  Supplements: Marland Kitchen Vitamin D, vitamin B12    Patient Goals/Self-Care Activities . Over the next 90 days, patient will:  - take medications as prescribed check glucose daily, document, and provide at future appointments  Follow Up Plan: Telephone follow up appointment with care management team member scheduled for: ~ 5 weeks      Medication Assistance:  None required.  Patient affirms current coverage meets needs.  Follow Up:  Patient agrees to Care Plan and Follow-up.  Plan: Telephone follow up appointment with care management team member scheduled for:  ~ 5 weeks  Catie Darnelle Maffucci, PharmD, [place], CPP Clinical Pharmacist [place] at [facility] [phone]  Encounter details: CCM Time Spent      Value Time  User   Total time (minutes)  0 12/16/2020  3:44 PM Dustin Fletcher, Dustin Fletcher     Moderate to High Complex Decision Making    None     CCM Services: This encounter meets routine CCM services.  Prior to outreach and patient consent for Chronic Care Management, I referred this patient for services after reviewing the nominated patient list or from a personal encounter with the patient.  I have personally reviewed this encounter including the documentation in this note and have collaborated with the care management provider regarding care management and care coordination activities to include development and update of the comprehensive care plan. I am certifying that I agree with the content of this note and encounter as supervising physician.  Margaret arnett, np

## 2020-12-16 NOTE — Patient Instructions (Signed)
Visit Information  Goals Addressed              This Visit's Progress     Patient Stated   .  Medication Monitoring (pt-stated)        Patient Goals/Self-Care Activities . Over the next 90 days, patient will:  - take medications as prescribed check glucose daily, document, and provide at future appointments        Patient verbalizes understanding of instructions provided today and agrees to view in MyChart.   Plan: Telephone follow up appointment with care management team member scheduled for:  ~ 5 weeks  Catie Feliz Beam, PharmD, [place], CPP Clinical Pharmacist [employer] at [employer] [phone]

## 2020-12-17 ENCOUNTER — Encounter: Payer: Self-pay | Admitting: Family

## 2020-12-20 ENCOUNTER — Telehealth: Payer: Self-pay | Admitting: Family

## 2020-12-20 DIAGNOSIS — E119 Type 2 diabetes mellitus without complications: Secondary | ICD-10-CM

## 2020-12-20 MED ORDER — XIGDUO XR 5-1000 MG PO TB24: 2.0000 | ORAL_TABLET | Freq: Every morning | ORAL | 1 refills | Status: DC

## 2020-12-20 MED ORDER — RYBELSUS 7 MG PO TABS: 14.0000 mg | ORAL_TABLET | Freq: Every day | ORAL | 3 refills | Status: DC

## 2020-12-20 MED ORDER — XIGDUO XR 5-1000 MG PO TB24: 1.0000 | ORAL_TABLET | Freq: Every day | ORAL | 3 refills | Status: DC

## 2020-12-20 NOTE — Telephone Encounter (Signed)
Call pt I reviewed catie, pharm D note . I changed [name] on chart so he can take two of the 7mg  tablets at once for total of 14mg  daily.  . Also, he can STOP farxiga and metformin. He can take Xigduo XR 5 /1000 . I sent to pharmacy and he will take TWO tablets daily in the [facility].   06-29-1978 Please ensure he has f/u with me.

## 2020-12-20 NOTE — Addendum Note (Signed)
Addended by: Allegra Grana on: 12/20/2020 09:52 AM   Modules accepted: Orders

## 2020-12-20 NOTE — Telephone Encounter (Signed)
I called patient & informed that both medications the increased dose of [name] as well as the Davonna Belling was sent to pharmacy. He does have f/u scheduled & will let us know if any issues.

## 2020-12-28 DIAGNOSIS — T1500XA Foreign body in cornea, unspecified eye, initial encounter: Secondary | ICD-10-CM | POA: Diagnosis not present

## 2020-12-29 ENCOUNTER — Telehealth: Payer: BC Managed Care – PPO | Admitting: Medical

## 2020-12-29 ENCOUNTER — Ambulatory Visit: Payer: BC Managed Care – PPO

## 2020-12-29 ENCOUNTER — Other Ambulatory Visit: Payer: Self-pay

## 2020-12-29 DIAGNOSIS — T1500XA Foreign body in cornea, unspecified eye, initial encounter: Secondary | ICD-10-CM | POA: Diagnosis not present

## 2020-12-29 DIAGNOSIS — Z20822 Contact with and (suspected) exposure to covid-19: Secondary | ICD-10-CM

## 2020-12-29 LAB — POC COVID19 BINAXNOW: SARS Coronavirus 2 Ag: NEGATIVE

## 2020-12-29 NOTE — Progress Notes (Signed)
   Subjective:    Patient ID: NICKOLAI RINKS, male    DOB: [birth year], 60 y.o.   MRN: [id number]  HPI 60 yo male  In non acute distress, went for weekly testing at [facility] answered that his nose is runny some due to allergies .  The testing center wanted him to get tested at the clinic.He feels well otherwise. He also got a piece of rust in his left  eye and it was hurting him due to a full schedule, he wenet over and saw [facility] and was treated by Dr. Dorcas Mcmurray yesterday successfully. He has follow up today and overall the eye is much better.   Review of Systems  Constitutional: Negative for chills, fatigue and fever.  HENT: Positive for rhinorrhea (allergies per patient). Negative for congestion and sore throat.   Respiratory: Negative for cough and shortness of breath.   Cardiovascular: Negative for chest pain.  Gastrointestinal: Negative for abdominal pain and diarrhea.  Allergic/Immunologic: Positive for environmental allergies.  Neurological: Negative for dizziness, syncope, light-headedness and headaches.       Objective:   Physical Exam   AXOX3 No physical performed on patient due to telemedicine appointment.      Recent Results (from the past 2160 hour(s))  POC COVID-19     Status: Normal   Collection Time: 12/29/20  8:54 AM  Result Value Ref Range   SARS Coronavirus 2 Ag Negative Negative    Comment: Patient unvaccinated and aware of negative result. Patient sent from weekly testing site to [facility] to determine if he was symptomatic   Assessment & Plan:  Covid-19 test Negative Return to clinc as needed. Patient verbalizes understanding and has no questions at the end of our conversation.

## 2021-01-10 ENCOUNTER — Other Ambulatory Visit: Payer: Self-pay

## 2021-01-10 DIAGNOSIS — E119 Type 2 diabetes mellitus without complications: Secondary | ICD-10-CM

## 2021-01-10 MED ORDER — RYBELSUS 14 MG PO TABS: 14.0000 mg | ORAL_TABLET | Freq: Every day | ORAL | 4 refills | Status: DC

## 2021-01-10 MED ORDER — RYBELSUS 7 MG PO TABS: 14.0000 mg | ORAL_TABLET | Freq: Every day | ORAL | 3 refills | Status: DC

## 2021-01-13 ENCOUNTER — Ambulatory Visit: Payer: BC Managed Care – PPO | Admitting: Pharmacist

## 2021-01-13 DIAGNOSIS — E119 Type 2 diabetes mellitus without complications: Secondary | ICD-10-CM

## 2021-01-13 DIAGNOSIS — E7800 Pure hypercholesterolemia, unspecified: Secondary | ICD-10-CM

## 2021-01-13 DIAGNOSIS — I10 Essential (primary) hypertension: Secondary | ICD-10-CM

## 2021-01-13 MED ORDER — RYBELSUS 14 MG PO TABS: 14.0000 mg | ORAL_TABLET | Freq: Every day | ORAL | 1 refills | Status: DC

## 2021-01-13 NOTE — Chronic Care Management (AMB) (Addendum)
Care Management   Pharmacy Note  01/13/2021 Name: Dustin Fletcher MRN: [id number] DOB: October 04, 1960  Subjective: Dustin Fletcher is a 60 y.o. year old male who is a primary care patient of [name], [name]. The Care Management team was consulted for assistance with care management and care coordination needs.    Engaged with patient by telephone for follow up visit in response to provider referral for pharmacy case management and/or care coordination services.   The patient was given information about Care Management services today including:  1. Care Management services includes personalized support from designated clinical staff supervised by the patient's primary care provider, including individualized plan of care and coordination with other care providers. 2. 24/7 contact phone numbers for assistance for urgent and routine care needs. 3. The patient may stop case management services at any time by phone call to the office staff.  Patient agreed to services and consent obtained.  Assessment:  Review of patient status, including review of consultants reports, laboratory and other test data, was performed as part of comprehensive evaluation and provision of chronic care management services.   SDOH (Social Determinants of Health) assessments and interventions performed:    Objective:  Lab Results  Component Value Date   CREATININE 0.88 06/24/2020   CREATININE 0.91 12/04/2019   CREATININE 0.96 06/05/2019    Lab Results  Component Value Date   HGBA1C 7.5 (H) 09/16/2020       Component Value Date/Time   CHOL 138 06/24/2020 0759   TRIG 263 (H) 06/24/2020 0759   HDL 37 (L) 06/24/2020 0759   CHOLHDL 3.7 06/05/2019 0744   LDLCALC 59 06/24/2020 0759    Clinical ASCVD: No  The 10-year ASCVD risk score [name], et al., 2013) is: 12.1%   Values used to calculate the score:     Age: 39 years     Sex: Male     Is Non-Hispanic African American: No     Diabetic:  Yes     Tobacco smoker: No     Systolic Blood Pressure: 333 mmHg     Is BP treated: Yes     HDL Cholesterol: 37 mg/dL     Total Cholesterol: 138 mg/dL     BP Readings from Last 3 Encounters:  11/17/20 112/68  08/13/20 116/68  06/24/20 129/79    Care Plan  No Known Allergies  Medications Reviewed Today    Reviewed by [name], [name] (Pharmacist) on 01/13/21 at 1521  Med List Status: <None>  Medication Order Taking? Sig Documenting Provider Last Dose Status Informant  aspirin EC 81 MG tablet [id number] Yes Take 81 mg by mouth daily. [provider] Taking Active   blood glucose meter kit and supplies KIT [id number] Yes Dispense based on patient and insurance preference. Use up to four times daily as directed. (FOR ICD-9 250.00, 250.01). [name], [name] Taking Active   Cholecalciferol (VITAMIN D3) 2000 UNITS TABS [id number] Yes Take 4,000 Units by mouth 2 (two) times daily. [provider] Taking Active   Dapagliflozin-metFORMIN HCl ER (XIGDUO XR) 01-999 MG TB24 [id number] Yes Take 2 tablets by mouth in the morning. [name], [name] Taking Active            Med Note [name] Jan 13, 2021  3:20 PM) Taking separate metformin 2000 mg daily and [name] 10 mg daily   ELDERBERRY PO [id number] Yes Take by mouth. [provider] Taking Active  ibuprofen (ADVIL,MOTRIN) 800 MG tablet [id number]  Take 800 mg by mouth 2 (two) times daily. [provider]  Active            Med Note [name], [facility] E   Fri Aug 13, 2020 11:07 AM) PRN  lisinopril (ZESTRIL) 10 MG tablet [id number] Yes Take 1 tablet (10 mg total) by mouth daily. [name], NP Taking Active   Omega-3 Fatty Acids (FISH OIL) 1000 MG CAPS [id number] Yes Take by mouth. [provider] Taking Active            Med Note [name] Oct 14, 2020  2:59 PM)    rosuvastatin (CRESTOR) 5 MG tablet [id number] Yes Take 1 tablet (5 mg total) by mouth  daily. [name], NP Taking Active   Semaglutide [facility]) 14 MG TABS [id number] Yes Take 14 mg by mouth daily. [name], [name] Taking Active   vitamin B-12 (CYANOCOBALAMIN) 1000 MCG tablet [id number] Yes Take 1,000 mcg by mouth daily. [provider] Taking Active           Patient Active Problem List   Diagnosis Date Noted  . History of total knee arthroplasty 12/10/2018  . Osteoarthritis of right knee 11/26/2018  . Diabetic peripheral neuropathy ([place]) 07/23/2017  . 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 Hypertension   . Diabetes mellitus without complication ([place])   . Hyperlipidemia     Conditions to be addressed/monitored: HTN, HLD and DMII  Care Plan : Medication Management  Updates made by [name], [name] since 01/13/2021 12:00 AM    Problem: Diabetes, Hypertension, Hyperlipidemia     Long-Range Goal: Disease Prevention Progression   Start Date: 10/14/2020  This Visit's Progress: On track  Recent Progress: On track  Priority: High  Note:   Current Barriers:  . Unable to independently afford treatment regimen . Unable to achieve control of diabetes   Pharmacist Clinical Goal(s):  Marland Kitchen Over the next 90 days, patient will achieve control of diabetes as evidenced by A1c  through collaboration with PharmD and provider.   Interventions: . 1:1 collaboration with [name], [name] regarding development and update of comprehensive plan of care as evidenced by provider attestation and co-signature . Inter-disciplinary care team collaboration (see longitudinal plan of care) . Comprehensive medication review performed; medication list updated in electronic medical record  Diabetes: . Uncontrolled; current treatment: metformin 1000 mg BID, [name] 10 mg daily - completing supply of separate medications before changing to Xigduo 01/999 mg 2 tablets; [name] 14 mg QAM . Confirms appropriate administration technique for [name].  . Does note some decreased  appetite with high dose [name] . Current glucose readings: fastings 110-115. No change in glucose since increasing [name] dose.  Marland Kitchen Recommend to continue current regimen at this time . Reviewed goal A1c, goal fasting and goal 2 hour post prandial glucose. Recommend adding post prandial checks.  . Discussed use of savings cards to improve affordability.   Hypertension: . Controlled per last clinic reading; current treatment: lisinopril 10 mg daily . Recommended to continue current regimen at this time  Hyperlipidemia and ASCVD risk reduction: . Controlled; current treatment: rosuvastatin 5 mg daily, OTC omega 3 fatty acids . Antiplatelet regimen: aspirin 81 mg daily . Recommend to continue current regimen at this time  Supplements: Marland Kitchen Vitamin D, vitamin B12    Patient Goals/Self-Care Activities . Over the next 90 days, patient will:  - take medications as prescribed check glucose daily, document, and provide at  future appointments  Follow Up Plan: Telephone follow up appointment with care management team member scheduled for: ~ 8 weeks       Medication Assistance:  None required.  Patient affirms current coverage meets needs.  Follow Up:  Patient agrees to Care Plan and Follow-up.  Plan: Telephone follow up appointment with care management team member scheduled for:  ~ 8 weeks  Catie Darnelle Maffucci, PharmD, [place], CPP Clinical Pharmacist [place] at [facility] [phone]  Encounter details: CCM Time Spent      Value Time User   Time spent with patient (minutes)  14 01/13/2021  3:41 PM [name], [name]   Time spent performing Chart review  1 01/13/2021  3:41 PM [name], [name]   Total time (minutes)  15 01/13/2021  3:41 PM [name], [name]     Moderate to High Complex Decision Making      Value Time User   Moderate to High complex decision making  No 01/13/2021  3:41 PM [name], [name]     CCM Services: This  encounter meets routine CCM services.  Prior to outreach and patient consent for Chronic Care Management, I referred this patient for services after reviewing the nominated patient list or from a personal encounter with the patient.  I have personally reviewed this encounter including the documentation in this note and have collaborated with the care management provider regarding care management and care coordination activities to include development and update of the comprehensive care plan. I am certifying that I agree with the content of this note and encounter as supervising physician. Margaret arnett, np

## 2021-01-13 NOTE — Patient Instructions (Signed)
Visit Information  Goals Addressed              This Visit's Progress     Patient Stated   .  Medication Monitoring (pt-stated)        Patient Goals/Self-Care Activities . Over the next 90 days, patient will:  - take medications as prescribed check glucose daily, document, and provide at future appointments       The patient verbalized understanding of instructions, educational materials, and care plan provided today and declined offer to receive copy of patient instructions, educational materials, and care plan.  Plan: Telephone follow up appointment with care management team member scheduled for:  ~ 8 weeks  Catie Feliz Beam, PharmD, [place], CPP Clinical Pharmacist [employer] at [employer] [phone]

## 2021-01-31 ENCOUNTER — Other Ambulatory Visit: Payer: Self-pay | Admitting: Unknown Physician Specialty

## 2021-01-31 DIAGNOSIS — I10 Essential (primary) hypertension: Secondary | ICD-10-CM

## 2021-01-31 DIAGNOSIS — E7800 Pure hypercholesterolemia, unspecified: Secondary | ICD-10-CM

## 2021-02-21 ENCOUNTER — Other Ambulatory Visit: Payer: Self-pay

## 2021-02-21 ENCOUNTER — Ambulatory Visit: Payer: BC Managed Care – PPO | Admitting: Family

## 2021-02-21 ENCOUNTER — Encounter: Payer: Self-pay | Admitting: Family

## 2021-02-21 VITALS — BP 110/70 | HR 96 | Temp 97.4°F | Ht 74.0 in | Wt 262.1 lb

## 2021-02-21 DIAGNOSIS — Z1211 Encounter for screening for malignant neoplasm of colon: Secondary | ICD-10-CM | POA: Diagnosis not present

## 2021-02-21 DIAGNOSIS — E119 Type 2 diabetes mellitus without complications: Secondary | ICD-10-CM | POA: Diagnosis not present

## 2021-02-21 DIAGNOSIS — I10 Essential (primary) hypertension: Secondary | ICD-10-CM | POA: Diagnosis not present

## 2021-02-21 DIAGNOSIS — E7800 Pure hypercholesterolemia, unspecified: Secondary | ICD-10-CM

## 2021-02-21 LAB — POCT GLYCOSYLATED HEMOGLOBIN (HGB A1C): Hemoglobin A1C: 6.4 % — AB (ref 4.0–5.6)

## 2021-02-21 NOTE — Assessment & Plan Note (Signed)
Controlled. Continue lisinopril 10mg 

## 2021-02-21 NOTE — Assessment & Plan Note (Signed)
Controlled. Continue crestor 5mg 

## 2021-02-21 NOTE — Assessment & Plan Note (Signed)
Controlled.  Continue Farziga 5mg  , metformin 1000mg  two tablets qam, rybelsus 14mg  qam

## 2021-02-21 NOTE — Patient Instructions (Signed)
A1c looks excellent  Nice to see you!

## 2021-02-21 NOTE — Progress Notes (Signed)
Subjective:    Patient ID: Dustin Fletcher, male    DOB: Aug 17, 1961, 60 y.o.   MRN: [id number]  CC: Dustin Fletcher is a 60 y.o. male who presents today for follow up.   HPI: Feels well  No complaints  DM- compliant with Farziga 96m , metformin 10078mtwo tablets qam, rybelsus 1435mam. FGB 125.  No nausea, vomiting.  Due for eye person.    HTN- compliant with lisinopril 38m23mD- compliant with crestor 5mg 80mue for colonoscopy at 60 years; he would prefer to be done 08/2021.   HISTORY:  Past Medical History:  Diagnosis Date   Bell's palsy    Diabetes mellitus without complication ([place]) [place]    Hypertension    Past Surgical History:  Procedure Laterality Date   HERNIA REPAIR     Family History  Problem Relation Age of Onset   Cancer Mother    Cancer Father    Diabetes Father    Hypertension Father    Heart disease Brother    Hypertension Brother     Allergies: Patient has no known allergies. Current Outpatient Medications on File Prior to Visit  Medication Sig Dispense Refill   aspirin EC 81 MG tablet Take 81 mg by mouth daily.     blood glucose meter kit and supplies KIT Dispense based on patient and insurance preference. Use up to four times daily as directed. (FOR ICD-9 250.00, 250.01). 1 each 0   Cholecalciferol (VITAMIN D3) 2000 UNITS TABS Take 4,000 Units by mouth 2 (two) times daily.     Dapagliflozin-metFORMIN HCl ER (XIGDUO XR) 01-999 MG TB24 Take 2 tablets by mouth in the morning. 120 tablet 1   ELDERBERRY PO Take by mouth.     ibuprofen (ADVIL,MOTRIN) 800 MG tablet Take 800 mg by mouth 2 (two) times daily.     lisinopril (ZESTRIL) 10 MG tablet Take 1 tablet (10 mg total) by mouth daily. 90 tablet 1   Omega-3 Fatty Acids (FISH OIL) 1000 MG CAPS Take by mouth.     rosuvastatin (CRESTOR) 5 MG tablet Take 1 tablet (5 mg total) by mouth daily. 90 tablet 1   Semaglutide ([name]) 14 MG TABS Take 14 mg by mouth daily. 90 tablet 1    vitamin B-12 (CYANOCOBALAMIN) 1000 MCG tablet Take 1,000 mcg by mouth daily.     No current facility-administered medications on file prior to visit.    Social History   Tobacco Use   Smoking status: Former    Pack years: 0.00    Types: Cigarettes    Quit date: 05/31/1981    Years since quitting: 39.7   Smokeless tobacco: Never  Vaping Use   Vaping Use: Never used  Substance Use Topics   Alcohol use: No    Alcohol/week: 0.0 standard drinks   Drug use: No    Review of Systems  Constitutional:  Negative for chills and fever.  Respiratory:  Negative for cough.   Cardiovascular:  Negative for chest pain and palpitations.  Gastrointestinal:  Negative for nausea and vomiting.     Objective:    BP 110/70   Pulse 96   Temp (!) 97.4 F (36.3 C) (Skin)   Ht '6\' 2"'  (1.88 m)   Wt 262 lb 1.9 oz (118.9 kg)   SpO2 97%   BMI 33.65 kg/m  BP Readings from Last 3 Encounters:  02/21/21 110/70  11/17/20 112/68  08/13/20 116/68   Wt Readings from Last  3 Encounters:  02/21/21 262 lb 1.9 oz (118.9 kg)  11/17/20 274 lb 9.6 oz (124.6 kg)  08/13/20 274 lb 1.6 oz (124.3 kg)    Physical Exam Vitals reviewed.  Constitutional:      Appearance: He is well-developed.  Cardiovascular:     Rate and Rhythm: Regular rhythm.     Heart sounds: Normal heart sounds.  Pulmonary:     Effort: Pulmonary effort is normal. No respiratory distress.     Breath sounds: Normal breath sounds. No wheezing, rhonchi or rales.  Skin:    General: Skin is warm and dry.  Neurological:     Mental Status: He is alert.  Psychiatric:        Speech: Speech normal.        Behavior: Behavior normal.       Assessment & Plan:   Problem List Items Addressed This Visit       Cardiovascular and Mediastinum   Hypertension    Controlled. Continue lisinopril 2m         Endocrine   Diabetes mellitus without complication ([place]    Controlled.  Continue Farziga 562m, metformin 100074mwo tablets  qam, rybelsus 28m56mm       Relevant Orders   POCT HgB A1C (Completed)     Other   Hyperlipidemia    Controlled. Continue crestor 5mg 51m     Other Visit Diagnoses     Screen for colon cancer       Relevant Orders   Ambulatory referral to Gastroenterology        I am having Dustin Fletcher his Vitamin D3, aspirin EC, vitamin B-12, Fish Oil, ibuprofen, ELDERBERRY PO, rosuvastatin, lisinopril, blood glucose meter kit and supplies, Xigduo XR, and [name].   No orders of the defined types were placed in this encounter.   Return precautions given.   Risks, benefits, and alternatives of the medications and treatment plan prescribed today were discussed, and patient expressed understanding.   Education regarding symptom management and diagnosis given to patient on AVS.  Continue to follow with Dustin Fletcher for routine health maintenance.   Dustin PoundsI agreed with plan.   Dustin Fletcher

## 2021-04-14 ENCOUNTER — Encounter: Payer: Self-pay | Admitting: Family

## 2021-04-14 ENCOUNTER — Ambulatory Visit: Payer: BC Managed Care – PPO | Admitting: Pharmacist

## 2021-04-14 DIAGNOSIS — E7800 Pure hypercholesterolemia, unspecified: Secondary | ICD-10-CM

## 2021-04-14 DIAGNOSIS — E119 Type 2 diabetes mellitus without complications: Secondary | ICD-10-CM

## 2021-04-14 DIAGNOSIS — I10 Essential (primary) hypertension: Secondary | ICD-10-CM

## 2021-04-14 NOTE — Chronic Care Management (AMB) (Signed)
Care Management   Pharmacy Note  04/14/2021 Name: Dustin Fletcher MRN: [id number] DOB: 1961/01/15  Subjective: Dustin Fletcher is a 60 y.o. year old male who is a primary care patient of [name], [name]. The Care Management team was consulted for assistance with care management and care coordination needs.    Engaged with patient by telephone for follow up visit in response to provider referral for pharmacy case management and/or care coordination services.   The patient was given information about Care Management services today including:  Care Management services includes personalized support from designated clinical staff supervised by the patient's primary care provider, including individualized plan of care and coordination with other care providers. 24/7 contact phone numbers for assistance for urgent and routine care needs. The patient may stop case management services at any time by phone call to the office staff.  Patient agreed to services and consent obtained.  Assessment:  Review of patient status, including review of consultants reports, laboratory and other test data, was performed as part of comprehensive evaluation and provision of chronic care management services.   SDOH (Social Determinants of Health) assessments and interventions performed:    Objective:  Lab Results  Component Value Date   CREATININE 0.88 06/24/2020   CREATININE 0.91 12/04/2019   CREATININE 0.96 06/05/2019    Lab Results  Component Value Date   HGBA1C 6.4 (A) 02/21/2021       Component Value Date/Time   CHOL 138 06/24/2020 0759   TRIG 263 (H) 06/24/2020 0759   HDL 37 (L) 06/24/2020 0759   CHOLHDL 3.7 06/05/2019 0744   LDLCALC 59 06/24/2020 0759    Clinical ASCVD: No  The 10-year ASCVD risk score [name], et al., 2013) is: 12.8%   Values used to calculate the score:     Age: 86 years     Sex: Male     Is Non-Hispanic African American: No     Diabetic: Yes      Tobacco smoker: No     Systolic Blood Pressure: 092 mmHg     Is BP treated: Yes     HDL Cholesterol: 37 mg/dL     Total Cholesterol: 138 mg/dL      BP Readings from Last 3 Encounters:  02/21/21 110/70  11/17/20 112/68  08/13/20 116/68    Care Plan  No Known Allergies  Medications Reviewed Today     Reviewed by [name], [name] (Pharmacist) on 04/14/21 at [place] List Status: <None>   Medication Order Taking? Sig Documenting Provider Last Dose Status Informant  aspirin EC 81 MG tablet [id number] Yes Take 81 mg by mouth daily. [provider] Taking Active   blood glucose meter kit and supplies KIT [id number] Yes Dispense based on patient and insurance preference. Use up to four times daily as directed. (FOR ICD-9 250.00, 250.01). [name], [name] Taking Active   Cholecalciferol (VITAMIN D3) 2000 UNITS TABS [id number] Yes Take 4,000 Units by mouth 2 (two) times daily. [provider] Taking Active   Dapagliflozin-metFORMIN HCl ER (XIGDUO XR) 01-999 MG TB24 [id number] Yes Take 2 tablets by mouth in the morning. [name], [name] Taking Active            Med Note [name] Apr 14, 2021  3:17 PM)    ELDERBERRY PO [id number] Yes Take by mouth. [provider] Taking Active   ibuprofen (ADVIL,MOTRIN) 800 MG tablet [id number] Yes Take 800  mg by mouth 2 (two) times daily. [provider] Taking Active            Med Note [name], [facility]   Fri Aug 13, 2020 11:07 AM) PRN  lisinopril (ZESTRIL) 10 MG tablet [id number] Yes Take 1 tablet (10 mg total) by mouth daily. [name], NP Taking Active   Omega-3 Fatty Acids (FISH OIL) 1000 MG CAPS [id number] Yes Take by mouth. [provider] Taking Active            Med Note [name] Oct 14, 2020  2:59 PM)    rosuvastatin (CRESTOR) 5 MG tablet [id number] Yes Take 1 tablet (5 mg total) by mouth daily. [name], NP Taking Active   Semaglutide  [facility]) 14 MG TABS [id number] Yes Take 14 mg by mouth daily. [name], [name] Taking Active   vitamin B-12 (CYANOCOBALAMIN) 1000 MCG tablet [id number] Yes Take 1,000 mcg by mouth daily. 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   Hypertension    Diabetes mellitus without complication ([place])    Hyperlipidemia     Conditions to be addressed/monitored: HTN, HLD, and DMII  Care Plan : Medication Management  Updates made by [name], [name] since 04/14/2021 12:00 AM  Completed 04/14/2021   Problem: Diabetes, Hypertension, Hyperlipidemia Resolved 04/14/2021     Long-Range Goal: Disease Prevention Progression Completed 04/14/2021  Start Date: 10/14/2020  This Visit's Progress: On track  Recent Progress: On track  Priority: High  Note:   Current Barriers:  Unable to independently afford treatment regimen Unable to achieve control of diabetes   Pharmacist Clinical Goal(s):  Over the next 90 days, patient will achieve control of diabetes as evidenced by A1c  through collaboration with PharmD and provider.   Interventions: 1:1 collaboration with [name], [name] regarding development and update of comprehensive plan of care as evidenced by provider attestation and co-signature Inter-disciplinary care team collaboration (see longitudinal plan of care) Comprehensive medication review performed; medication list updated in electronic medical record  Health Maintenance: Asks about colonoscopy referral. Collaborated w/ referral coordinator to address patient's concerns.  Plans to schedule DM eye exam in December when he has time off. Asks about nail discoloration (black/blue). Advised to send picture to PCP via MyChart for recommendations. Reports  a remote history of   Diabetes: Controlled; current treatment: Xigduo 01/999 mg 2 tablets; [name] 14 mg QAM Reports some periodic dry mouth.  Current glucose readings: not checking periodically.  Recommend to continue current regimen at this time along with periodic BG checks to ensure continued control  Hypertension: Controlled per last clinic reading; current treatment: lisinopril 10 mg daily Recommended to continue current regimen at this time  Hyperlipidemia and ASCVD risk reduction: Controlled; current treatment: rosuvastatin 5 mg daily, OTC omega 3 fatty acids Antiplatelet regimen: aspirin 81 mg daily Recommend to continue current regimen at this time  Supplements: Vitamin D, vitamin B12    Patient Goals/Self-Care Activities Over the next 90 days, patient will:  - take medications as prescribed check glucose daily, document, and provide at future appointments  Follow Up Plan: Goals of care met. Closing care management case.      Medication Assistance:  None required.  Patient affirms current coverage meets needs.  Follow Up:  Patient requests no follow-up at this time.  Plan: Patient has my contact information for any future questions or concerns  Catie Darnelle Maffucci, PharmD, [place], [place] Clinical Pharmacist [employer] at [place]

## 2021-04-14 NOTE — Patient Instructions (Signed)
Visit Information   Goals Addressed               This Visit's Progress     Patient Stated     COMPLETED: Medication Monitoring (pt-stated)        Patient Goals/Self-Care Activities Over the next 90 days, patient will:  - take medications as prescribed check glucose daily, document, and provide at future appointments        Patient verbalizes understanding of instructions provided today and agrees to view in MyChart.   Plan: Patient has my contact information for any future questions or concerns  Catie Feliz Beam, PharmD, [place], CPP Clinical Pharmacist [employer] at [employer] [phone]

## 2021-04-19 ENCOUNTER — Telehealth: Payer: Self-pay | Admitting: Family

## 2021-04-19 NOTE — Telephone Encounter (Signed)
I spoke with patient & he was already referred to [facility] where he went for last coloscopy. He had his first one done at 43 & he has already got a call to schedule one for this year. He missed call & will call them back. It appears you placed referral back on 6/13.

## 2021-04-19 NOTE — Telephone Encounter (Signed)
Call patient I was reviewing note, consult from [facility] pharmacist patient inquired about colonoscopy referral.    It appears colonoscopy is up-to-date although I do not have any record of his actual colonoscopy.   reported to be done in 2014.  Is he due for his colonoscopy?  When was he due to return for colonoscopy ?

## 2021-04-19 NOTE — Telephone Encounter (Signed)
LMTCB

## 2021-05-17 ENCOUNTER — Other Ambulatory Visit: Payer: Self-pay | Admitting: Family

## 2021-05-17 DIAGNOSIS — E119 Type 2 diabetes mellitus without complications: Secondary | ICD-10-CM

## 2021-06-13 ENCOUNTER — Other Ambulatory Visit: Payer: Self-pay | Admitting: Unknown Physician Specialty

## 2021-06-13 DIAGNOSIS — I10 Essential (primary) hypertension: Secondary | ICD-10-CM

## 2021-06-13 DIAGNOSIS — E7800 Pure hypercholesterolemia, unspecified: Secondary | ICD-10-CM

## 2021-06-14 ENCOUNTER — Other Ambulatory Visit: Payer: Self-pay | Admitting: Unknown Physician Specialty

## 2021-06-14 DIAGNOSIS — E7800 Pure hypercholesterolemia, unspecified: Secondary | ICD-10-CM

## 2021-06-14 DIAGNOSIS — I10 Essential (primary) hypertension: Secondary | ICD-10-CM

## 2021-08-16 ENCOUNTER — Other Ambulatory Visit: Payer: BC Managed Care – PPO

## 2021-08-16 ENCOUNTER — Other Ambulatory Visit: Payer: Self-pay

## 2021-08-16 DIAGNOSIS — Z0000 Encounter for general adult medical examination without abnormal findings: Secondary | ICD-10-CM

## 2021-08-19 LAB — COMPREHENSIVE METABOLIC PANEL
ALT: 15 IU/L (ref 0–44)
AST: 16 IU/L (ref 0–40)
Albumin/Globulin Ratio: 1.9 (ref 1.2–2.2)
Albumin: 4.7 g/dL (ref 3.8–4.9)
Alkaline Phosphatase: 69 IU/L (ref 44–121)
BUN/Creatinine Ratio: 17 (ref 10–24)
BUN: 15 mg/dL (ref 8–27)
Bilirubin Total: 0.4 mg/dL (ref 0.0–1.2)
CO2: 21 mmol/L (ref 20–29)
Calcium: 9.3 mg/dL (ref 8.6–10.2)
Chloride: 103 mmol/L (ref 96–106)
Creatinine, Ser: 0.87 mg/dL (ref 0.76–1.27)
Globulin, Total: 2.5 g/dL (ref 1.5–4.5)
Glucose: 127 mg/dL — ABNORMAL HIGH (ref 70–99)
Potassium: 4.4 mmol/L (ref 3.5–5.2)
Sodium: 140 mmol/L (ref 134–144)
Total Protein: 7.2 g/dL (ref 6.0–8.5)
eGFR: 99 mL/min/{1.73_m2} (ref >59)

## 2021-08-19 LAB — PROTEIN ELECTRO, RANDOM URINE
Albumin ELP, Urine: 100 %
Alpha-1-Globulin, U: 0 %
Alpha-2-Globulin, U: 0 %
Beta Globulin, U: 0 %
Gamma Globulin, U: 0 %
Protein, Ur: 8.8 mg/dL

## 2021-08-21 ENCOUNTER — Encounter: Payer: Self-pay | Admitting: Family

## 2021-08-24 ENCOUNTER — Other Ambulatory Visit: Payer: Self-pay

## 2021-08-24 ENCOUNTER — Ambulatory Visit: Payer: BC Managed Care – PPO | Admitting: Family

## 2021-08-24 DIAGNOSIS — E119 Type 2 diabetes mellitus without complications: Secondary | ICD-10-CM

## 2021-08-24 DIAGNOSIS — I10 Essential (primary) hypertension: Secondary | ICD-10-CM

## 2021-08-24 DIAGNOSIS — E7800 Pure hypercholesterolemia, unspecified: Secondary | ICD-10-CM

## 2021-08-24 MED ORDER — RYBELSUS 14 MG PO TABS: 14.0000 mg | ORAL_TABLET | Freq: Every day | ORAL | 1 refills | Status: DC

## 2021-08-24 MED ORDER — ROSUVASTATIN CALCIUM 5 MG PO TABS: 5.0000 mg | ORAL_TABLET | Freq: Every day | ORAL | 1 refills | Status: DC

## 2021-08-24 MED ORDER — XIGDUO XR 5-1000 MG PO TB24: 2.0000 | ORAL_TABLET | Freq: Every morning | ORAL | 0 refills | Status: DC

## 2021-08-24 MED ORDER — LISINOPRIL 10 MG PO TABS: 10.0000 mg | ORAL_TABLET | Freq: Every day | ORAL | 1 refills | Status: DC

## 2021-08-24 NOTE — Progress Notes (Signed)
° °Subjective:  ° ° Patient ID: Dustin Fletcher, male    DOB: 03/04/1961, 60 y.o.   MRN: [id number] ° °CC: Dustin Fletcher is a 60 y.o. male who presents today for follow up.  ° °HPI: Feels well today ° °At home weight is 257lbs.He is not eating as much in quantity.  ° °Very physically active at work with [employer]. No cp sob.  ° °No chills, abdominal pain, nights sweats, bone pain.  ° °Multiple myeloma panel was ordered.  The reason for this is unclear to patient and I as ordered by [employer] [facility].  ° °DM- FBG 127. Compliant with dapaglifozin/metformin 01-999 two tablets qam, rybelsus 14mg ° °HLD- compliant with crestor 5mg ° ° ° ° °HTN- compliant with lisinopril 10mg. No cp.  ° °Colonoscopy UTD °HISTORY:  °Past Medical History:  °Diagnosis Date  ° Bell's palsy   ° Diabetes mellitus without complication (HCC)   ° Hyperlipidemia   ° Hypertension   ° °Past Surgical History:  °Procedure Laterality Date  ° HERNIA REPAIR    ° °Family History  °Problem Relation Age of Onset  ° Cancer Mother   ° Cancer Father   ° Diabetes Father   ° Hypertension Father   ° Heart disease Brother   ° Hypertension Brother   ° ° °Allergies: Patient has no known allergies. °Current Outpatient Medications on File Prior to Visit  °Medication Sig Dispense Refill  ° aspirin EC 81 MG tablet Take 81 mg by mouth daily.    ° blood glucose meter kit and supplies KIT Dispense based on patient and insurance preference. Use up to four times daily as directed. (FOR ICD-9 250.00, 250.01). 1 each 0  ° Cholecalciferol (VITAMIN D3) 2000 UNITS TABS Take 4,000 Units by mouth 2 (two) times daily.    ° ELDERBERRY PO Take by mouth.    ° ibuprofen (ADVIL,MOTRIN) 800 MG tablet Take 800 mg by mouth 2 (two) times daily.    ° lisinopril (ZESTRIL) 10 MG tablet TAKE ONE TABLET BY MOUTH DAILY 75 tablet 0  ° Omega-3 Fatty Acids (FISH OIL) 1000 MG CAPS Take by mouth.    ° rosuvastatin (CRESTOR) 5 MG tablet TAKE ONE TABLET BY MOUTH DAILY  75 tablet 1  ° Semaglutide ([name]) 14 MG TABS Take 14 mg by mouth daily. 90 tablet 1  ° vitamin B-12 (CYANOCOBALAMIN) 1000 MCG tablet Take 1,000 mcg by mouth daily.    ° XIGDUO XR 01-999 MG TB24 TAKE TWO TABLETS BY MOUTH EVERY MORNING 180 tablet 0  ° °No current facility-administered medications on file prior to visit.  ° ° °Social History  ° °Tobacco Use  ° Smoking status: Former  °  Types: Cigarettes  °  Quit date: 05/31/1981  °  Years since quitting: 40.2  ° Smokeless tobacco: Never  °Vaping Use  ° Vaping Use: Never used  °Substance Use Topics  ° Alcohol use: No  °  Alcohol/week: 0.0 standard drinks  ° Drug use: No  ° ° °Review of Systems  °Constitutional:  Negative for chills, fever and unexpected weight change.  °Respiratory:  Negative for cough.   °Cardiovascular:  Negative for chest pain and palpitations.  °Gastrointestinal:  Negative for nausea and vomiting.  °   °Objective:  °  °BP 122/70    Pulse 90    Temp 97.9 °F (36.6 °C)    Resp 16    Wt 261 lb (118.4 kg)    SpO2 98%      BMI 33.51 kg/m  BP Readings from Last 3 Encounters:  08/24/21 122/70  02/21/21 110/70  11/17/20 112/68   Wt Readings from Last 3 Encounters:  08/24/21 261 lb (118.4 kg)  02/21/21 262 lb 1.9 oz (118.9 kg)  11/17/20 274 lb 9.6 oz (124.6 kg)    Physical Exam Vitals reviewed.  Constitutional:      Appearance: He is well-developed.  Cardiovascular:     Rate and Rhythm: Regular rhythm.     Heart sounds: Normal heart sounds.  Pulmonary:     Effort: Pulmonary effort is normal. No respiratory distress.     Breath sounds: Normal breath sounds. No wheezing, rhonchi or rales.  Skin:    General: Skin is warm and dry.  Neurological:     Mental Status: He is alert.  Psychiatric:        Speech: Speech normal.        Behavior: Behavior normal.       Assessment & Plan:   Problem List Items Addressed This Visit       Cardiovascular and Mediastinum   Hypertension    Chronic, stable.lisinopril 59m.         Endocrine   Diabetes mellitus without complication ([place]    Anticipate controlled. [facility] 1 lb weight loss from prior appointment which is not unintended. Approx 12 lbs from prior 274.  Denies any B symptoms today.  Discussed method of action of [name] as I suspect contributory as well as decreasing portion sizes.  We will continue to monitor weight loss.        Other   Hyperlipidemia    Anticipate controlled.  Pending lipid panel.  Continue Crestor 5 mg        I am having MZadeC. WSenaida Ores maintain his Vitamin D3, aspirin EC, vitamin B-12, Fish Oil, ibuprofen, ELDERBERRY PO, blood glucose meter kit and supplies, [name], Xigduo XR, lisinopril, and rosuvastatin.   No orders of the defined types were placed in this encounter.   Return precautions given.   Risks, benefits, and alternatives of the medications and treatment plan prescribed today were discussed, and patient expressed understanding.   Education regarding symptom management and diagnosis given to patient on AVS.  Continue to follow with ABurnard Hawthorne [name] for routine health maintenance.   MDonia Poundsand I agreed with plan.   MMable Paris [name]

## 2021-08-24 NOTE — Assessment & Plan Note (Signed)
Anticipate controlled.  Pending lipid panel.  Continue Crestor 5 mg

## 2021-08-24 NOTE — Addendum Note (Signed)
Addended by: Elise Benne T on: 08/24/2021 03:07 PM   Modules accepted: Orders

## 2021-08-24 NOTE — Patient Instructions (Signed)
Nice to see you!   

## 2021-08-24 NOTE — Assessment & Plan Note (Signed)
Chronic, stable.lisinopril 10mg .

## 2021-08-24 NOTE — Assessment & Plan Note (Addendum)
Anticipate controlled. Patient will have labs done with [facility]. 1 lb weight loss from prior appointment which is not unintended. Approx 12 lbs from prior 274.  Denies any B symptoms today.  Discussed method of action of [name] as I suspect contributory as well as decreasing portion sizes.  We will continue to monitor weight loss.

## 2021-09-13 ENCOUNTER — Other Ambulatory Visit: Payer: BC Managed Care – PPO

## 2021-09-13 ENCOUNTER — Other Ambulatory Visit: Payer: Self-pay

## 2021-09-13 DIAGNOSIS — Z0000 Encounter for general adult medical examination without abnormal findings: Secondary | ICD-10-CM

## 2021-09-14 LAB — CBC WITH DIFFERENTIAL/PLATELET
Basophils Absolute: 0 10*3/uL (ref 0.0–0.2)
Basos: 1 %
EOS (ABSOLUTE): 0.1 10*3/uL (ref 0.0–0.4)
Eos: 2 %
Hematocrit: 44.5 % (ref 37.5–51.0)
Hemoglobin: 15.5 g/dL (ref 13.0–17.7)
Immature Grans (Abs): 0 10*3/uL (ref 0.0–0.1)
Immature Granulocytes: 1 %
Lymphocytes Absolute: 1.1 10*3/uL (ref 0.7–3.1)
Lymphs: 17 %
MCH: 29.8 pg (ref 26.6–33.0)
MCHC: 34.8 g/dL (ref 31.5–35.7)
MCV: 85 fL (ref 79–97)
Monocytes Absolute: 0.6 10*3/uL (ref 0.1–0.9)
Monocytes: 10 %
Neutrophils Absolute: 4.4 10*3/uL (ref 1.4–7.0)
Neutrophils: 69 %
Platelets: 241 10*3/uL (ref 150–450)
RBC: 5.21 x10E6/uL (ref 4.14–5.80)
RDW: 13.9 % (ref 11.6–15.4)
WBC: 6.3 10*3/uL (ref 3.4–10.8)

## 2021-09-14 LAB — COMPREHENSIVE METABOLIC PANEL
ALT: 19 IU/L (ref 0–44)
AST: 16 IU/L (ref 0–40)
Albumin/Globulin Ratio: 2.3 — ABNORMAL HIGH (ref 1.2–2.2)
Albumin: 4.6 g/dL (ref 3.8–4.9)
Alkaline Phosphatase: 69 IU/L (ref 44–121)
BUN/Creatinine Ratio: 15 (ref 10–24)
BUN: 15 mg/dL (ref 8–27)
Bilirubin Total: 0.4 mg/dL (ref 0.0–1.2)
CO2: 19 mmol/L — ABNORMAL LOW (ref 20–29)
Calcium: 9.5 mg/dL (ref 8.6–10.2)
Chloride: 102 mmol/L (ref 96–106)
Creatinine, Ser: 0.99 mg/dL (ref 0.76–1.27)
Globulin, Total: 2 g/dL (ref 1.5–4.5)
Glucose: 162 mg/dL — ABNORMAL HIGH (ref 70–99)
Potassium: 4.3 mmol/L (ref 3.5–5.2)
Sodium: 139 mmol/L (ref 134–144)
Total Protein: 6.6 g/dL (ref 6.0–8.5)
eGFR: 87 mL/min/{1.73_m2} (ref >59)

## 2021-09-14 LAB — LIPID PANEL
Chol/HDL Ratio: 3.5 ratio (ref 0.0–5.0)
Cholesterol, Total: 120 mg/dL (ref 100–199)
HDL: 34 mg/dL — ABNORMAL LOW (ref >39)
LDL Chol Calc (NIH): 57 mg/dL (ref 0–99)
Triglycerides: 171 mg/dL — ABNORMAL HIGH (ref 0–149)
VLDL Cholesterol Cal: 29 mg/dL (ref 5–40)

## 2021-09-14 LAB — MICROALBUMIN / CREATININE URINE RATIO
Creatinine, Urine: 133.3 mg/dL
Microalb/Creat Ratio: 125 mg/g creat — ABNORMAL HIGH (ref 0–29)
Microalbumin, Urine: 166.2 ug/mL

## 2021-09-14 LAB — HGB A1C W/O EAG: Hgb A1c MFr Bld: 6.5 % — ABNORMAL HIGH (ref 4.8–5.6)

## 2021-09-14 LAB — VITAMIN D 25 HYDROXY (VIT D DEFICIENCY, FRACTURES): Vit D, 25-Hydroxy: 47.4 ng/mL (ref 30.0–100.0)

## 2021-09-14 LAB — PSA: Prostate Specific Ag, Serum: 0.4 ng/mL (ref 0.0–4.0)

## 2021-09-14 LAB — TSH: TSH: 0.982 u[IU]/mL (ref 0.450–4.500)

## 2021-09-23 ENCOUNTER — Other Ambulatory Visit: Payer: Self-pay

## 2021-09-23 DIAGNOSIS — I10 Essential (primary) hypertension: Secondary | ICD-10-CM

## 2021-09-23 MED ORDER — LISINOPRIL 20 MG PO TABS: 20.0000 mg | ORAL_TABLET | Freq: Every day | ORAL | 3 refills | Status: DC

## 2021-10-04 ENCOUNTER — Other Ambulatory Visit: Payer: Self-pay

## 2021-10-04 ENCOUNTER — Other Ambulatory Visit: Payer: BC Managed Care – PPO

## 2021-10-04 DIAGNOSIS — Z0000 Encounter for general adult medical examination without abnormal findings: Secondary | ICD-10-CM

## 2021-10-05 LAB — BASIC METABOLIC PANEL
BUN/Creatinine Ratio: 20 (ref 10–24)
BUN: 18 mg/dL (ref 8–27)
CO2: 22 mmol/L (ref 20–29)
Calcium: 9.8 mg/dL (ref 8.6–10.2)
Chloride: 101 mmol/L (ref 96–106)
Creatinine, Ser: 0.9 mg/dL (ref 0.76–1.27)
Glucose: 166 mg/dL — ABNORMAL HIGH (ref 70–99)
Potassium: 4.3 mmol/L (ref 3.5–5.2)
Sodium: 139 mmol/L (ref 134–144)
eGFR: 98 mL/min/{1.73_m2} (ref >59)

## 2021-10-05 LAB — HGB A1C W/O EAG: Hgb A1c MFr Bld: 6.6 % — ABNORMAL HIGH (ref 4.8–5.6)

## 2021-10-24 ENCOUNTER — Ambulatory Visit: Payer: BC Managed Care – PPO | Admitting: Medical

## 2021-10-24 ENCOUNTER — Encounter: Payer: Self-pay | Admitting: Medical

## 2021-10-24 ENCOUNTER — Other Ambulatory Visit: Payer: Self-pay

## 2021-10-24 ENCOUNTER — Ambulatory Visit
Admission: RE | Admit: 2021-10-24 | Discharge: 2021-10-24 | Disposition: A | Discharge status: Home / Self Care | Payer: BC Managed Care – PPO | Attending: Medical | Admitting: Medical

## 2021-10-24 ENCOUNTER — Ambulatory Visit
Admission: RE | Admit: 2021-10-24 | Discharge: 2021-10-24 | Disposition: A | Discharge status: Home / Self Care | Payer: BC Managed Care – PPO | Source: Ambulatory Visit | Attending: Medical | Admitting: Medical

## 2021-10-24 ENCOUNTER — Telehealth: Payer: Self-pay | Admitting: Family

## 2021-10-24 VITALS — BP 98/62 | HR 107 | Temp 97.9°F | Resp 16

## 2021-10-24 DIAGNOSIS — R0781 Pleurodynia: Secondary | ICD-10-CM | POA: Insufficient documentation

## 2021-10-24 DIAGNOSIS — Z043 Encounter for examination and observation following other accident: Secondary | ICD-10-CM | POA: Diagnosis not present

## 2021-10-24 MED ORDER — LISINOPRIL 10 MG PO TABS: 10.0000 mg | ORAL_TABLET | Freq: Every day | ORAL | 3 refills | Status: DC

## 2021-10-24 NOTE — Telephone Encounter (Signed)
BP Readings from Last 3 Encounters:  10/24/21 98/62  08/24/21 122/70  02/21/21 110/70   Call pt  Agree with decreasing lisinopril from 10-20mg   Please palpitations, chest pain ,shortness of breath  I would advise appt so we can assess orthostatics and obtain an EKG to ensure all normal.   Please sch

## 2021-10-24 NOTE — Telephone Encounter (Signed)
I spoke with patient & he confirmed that he is not having any SOB, left arm pain, palpitations or CP.  I have scheduled him to come in on 2/27 for orthostatic vitals & EKG. I have sent in 10 mg lisinopril to pharmacy.

## 2021-10-24 NOTE — Telephone Encounter (Signed)
Pt called in stating the provider at [facility] advise him that he needed to call our office to report his blood pressure reading. Pt stated that provider advised him his blood pressure is 98/62. Pt stated the provider at [facility] advise him that his doctor needs to change blood pressure medication. Pt requesting callback

## 2021-10-24 NOTE — Telephone Encounter (Signed)
On Thursday patient fell as work. He was nailing a nail into the ground & when he came up he stumbled to the left then passed out. PA at [facility] felt that this was BP related since on that law end of normal. Pt did not have any issues until Lisinopril went for 10 mg to 20 mg.

## 2021-10-24 NOTE — Progress Notes (Signed)
[facility] 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 When he did he tripped over a white box in front of him and then landed on the concrete. Patient reports he used his hands to brace and partially catch himself when landing on the concrete.    Provider Note Patient fell on to left side  of his body when he fell.  Vitals here are his  BP 98/62 ( low reading). He did not hit his head or lose consciousness. He can MAEW. He is tender under his breast tissue on the left side.  Lungs are clear to auscultation and heart rate is rate and rhythm is regular no murmurs, rubs or gallops. The tissue is slight red , patient states he has been using deep blue muscle rub, rubbing onto the skin to help with his pain. Full ROM of neck with out pain. He is no acute distress. Patient sent for x-ray of left ribs and PA chest x-ray.  Diagnosis  Left rib pain  Medications Prescribed  OTC Ibuprofen 800 mg by mouth (4 tablets x 200mg ) every 8 hours as needed for pain. Take with food.   Referred to return to [facility] in 7 days for a recheck.    Return to Work Status Activity as tolerated, if painful do not do.   Provider Signature   ________________________________________Date_________   Employee Signature _______________________________________Date_________   Please email this completed form to [facility], Director of Risk Management at [email or web address] within 24 hours of visit.

## 2021-10-31 ENCOUNTER — Encounter: Payer: Self-pay | Admitting: Medical

## 2021-10-31 ENCOUNTER — Ambulatory Visit: Payer: BC Managed Care – PPO | Admitting: Medical

## 2021-10-31 ENCOUNTER — Other Ambulatory Visit: Payer: Self-pay

## 2021-10-31 VITALS — BP 138/78 | HR 83 | Temp 97.6°F | Resp 16

## 2021-10-31 DIAGNOSIS — R0781 Pleurodynia: Secondary | ICD-10-CM

## 2021-10-31 DIAGNOSIS — Z026 Encounter for examination for insurance purposes: Secondary | ICD-10-CM

## 2021-10-31 NOTE — Progress Notes (Signed)
Patient ID: Dustin Fletcher, male   DOB: 08/27/1961, 60 y.o.   MRN: [id number]    [facility]  [facility] and [facility] [address]  [place], [place] [address] Phone [phone] Fax  [phone]  Worker's Compensation Report Form   [place] GEOR[place] Date of DYNXG:[id number] Phone Number: [phone]/ [phone] Email: [email or web address] Department: Ronnell [place] Job Title: Naaman Plummer Supervisor: Elige Ko Supervisor Notified: Y  Date of Injury: 10/20/21 Time of Injury: 1:30 PM Shift Worked: [employer] where injury occurred (address or landmark): [employer]  Body Part Injured: Left lower chest/side  Vital Signs BP 138/78 T 97.6 RR 16 HR 83 SPO2 99  Injury Description  Patient reports he was bent over and when he stood back up he got dizzy and drifted to the left. When he did he tripped over a white box in front of him and then landed on the concrete. Patient reports he used his hands to brace and partially catch himself when landing on the concrete.    Provider Note Patient presents today in good spirits.  He states he has no more pain and worked this weekend. NAD,  MAEW gait wnl.   Diagnosis  Rib pain resolved  Medications Prescribed  No orders of the defined types were placed in this encounter.   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

## 2021-11-07 ENCOUNTER — Other Ambulatory Visit: Payer: Self-pay

## 2021-11-07 ENCOUNTER — Ambulatory Visit: Payer: BC Managed Care – PPO | Admitting: Family

## 2021-11-07 ENCOUNTER — Encounter: Payer: Self-pay | Admitting: Family

## 2021-11-07 VITALS — BP 138/76 | HR 102 | Temp 97.6°F | Ht 74.0 in | Wt 261.6 lb

## 2021-11-07 DIAGNOSIS — R42 Dizziness and giddiness: Secondary | ICD-10-CM | POA: Diagnosis not present

## 2021-11-07 DIAGNOSIS — E118 Type 2 diabetes mellitus with unspecified complications: Secondary | ICD-10-CM

## 2021-11-07 DIAGNOSIS — I10 Essential (primary) hypertension: Secondary | ICD-10-CM | POA: Diagnosis not present

## 2021-11-07 MED ORDER — LISINOPRIL 5 MG PO TABS: 5.0000 mg | ORAL_TABLET | Freq: Every day | ORAL | 1 refills | Status: DC

## 2021-11-07 MED ORDER — LISINOPRIL 10 MG PO TABS: 10.0000 mg | ORAL_TABLET | Freq: Every day | ORAL | 3 refills | Status: DC

## 2021-11-07 NOTE — Assessment & Plan Note (Addendum)
Borderline orthostatic (see flowsheet).  Encouraged to be careful with position changes.  EKG shows sinus rhythm without acute changes from prior EKG obtained 08/26/2012. Reviewed EKG and case with  Dr Derrel Nip.  Concern for widened QRS and possible conduction abnormality.  We agreed consult with cardiology for further evaluation and most certainly restratification in the setting of longstanding hypertension, diabetes.  Patient  declined this at time of visit as he is very busy at work right now.  Call out to patient to reiterate the importance of seeing cardiology.  Will follow

## 2021-11-07 NOTE — Assessment & Plan Note (Signed)
A1c well controlled.  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  We have opted to increase lisinopril from 10 mg to 15 mg and see if he can tolerate this better than the 20 mg dose.  Encouraged him to be mindful position changes especially as we escalate lisinopril.  Encouraged him to drink plenty of water particularly as he has a physically active job.  We also discussed increasing [name] from 5 mg to 10 mg and also consulting nephrology most certainly if microalbuminuria does not improve

## 2021-11-07 NOTE — Patient Instructions (Addendum)
Increase lisinopril to 15mg  per day  Let me know when you can do echocardiogram of heart  Let me know if you feel dizzy again however as discussed, please be very careful when changing positions.  Please drink plenty water  Dizziness Dizziness is a common problem. It is a feeling of unsteadiness or light-headedness. You may feel like you are about to faint. Dizziness can lead to injury if you stumble or fall. Anyone can become dizzy, but dizziness is more common in older adults. This condition can be caused by a number of things, including medicines, dehydration, or illness. Follow these instructions at home: Eating and drinking  Drink enough fluid to keep your urine pale yellow. This helps to keep you from becoming dehydrated. Try to drink more clear fluids, such as water. Do not drink alcohol. Limit your caffeine intake if told to do so by your health care provider. Check ingredients and nutrition facts to see if a food or beverage contains caffeine. Limit your salt (sodium) intake if told to do so by your health care provider. Check ingredients and nutrition facts to see if a food or beverage contains sodium. Activity  Avoid making quick movements. Rise slowly from chairs and steady yourself until you feel okay. In the morning, first sit up on the side of the bed. When you feel okay, stand slowly while you hold onto something until you know that your balance is good. If you need to stand in one place for a long time, move your legs often. Tighten and relax the muscles in your legs while you are standing. Do not drive or use machinery if you feel dizzy. Avoid bending down if you feel dizzy. Place items in your home so that they are easy for you to reach without leaning over. Lifestyle Do not use any products that contain nicotine or tobacco. These products include cigarettes, chewing tobacco, and vaping devices, such as e-cigarettes. If you need help quitting, ask your health care  provider. Try to reduce your stress level by using methods such as yoga or meditation. Talk with your health care provider if you need help to manage your stress. General instructions Watch your dizziness for any changes. Take over-the-counter and prescription medicines only as told by your health care provider. Talk with your health care provider if you think that your dizziness is caused by a medicine that you are taking. Tell a friend or a family member that you are feeling dizzy. If he or she notices any changes in your behavior, have this person call your health care provider. Keep all follow-up visits. This is important. Contact a health care provider if: Your dizziness does not go away or you have new symptoms. Your dizziness or light-headedness gets worse. You feel nauseous. You have reduced hearing. You have a fever. You have neck pain or a stiff neck. Your dizziness leads to an injury or a fall. Get help right away if: You vomit or have diarrhea and are unable to eat or drink anything. You have problems talking, walking, swallowing, or using your arms, hands, or legs. You feel generally weak. You have any bleeding. You are not thinking clearly or you have trouble forming sentences. It may take a friend or family member to notice this. You have chest pain, abdominal pain, shortness of breath, or sweating. Your vision changes or you develop a severe headache. These symptoms may represent a serious problem that is an emergency. Do not wait to see if the symptoms  will go away. Get medical help right away. Call your local emergency services (911 in the [place] Do not drive yourself to the hospital. Summary Dizziness is a feeling of unsteadiness or light-headedness. This condition can be caused by a number of things, including medicines, dehydration, or illness. Anyone can become dizzy, but dizziness is more common in older adults. Drink enough fluid to keep your urine pale yellow. Do  not drink alcohol. Avoid making quick movements if you feel dizzy. Monitor your dizziness for any changes. This information is not intended to replace advice given to you by your health care provider. Make sure you discuss any questions you have with your health care provider. Document Revised: 08/02/2020 Document Reviewed: 08/02/2020 Elsevier Patient Education  2022 [employer].

## 2021-11-07 NOTE — Progress Notes (Signed)
Subjective:    Patient ID: Dustin Fletcher, male    DOB: April 02, 1961, 61 y.o.   MRN: [id number]  CC: Dustin Fletcher is a 61 y.o. male who presents today for an acute visit.    HPI: Here today follow up episode of dizziness 7 days ago, without further episodes  No cp, ha, left pain, left rib side pain, sob, coughing, syncope.  Coughing 'real hard may be uncomfortable.' He isnt sure that he drinks a lot of water.   He is subsequently decreased lisinopril back to prior dose of 88m without recurrence of dizziness.  He has history of microalbuminemia  DM-compliant with [name] 14 mg, dapagliflozin-metformin 01-999 mg A1c 1 month ago 6.6  [facility]. Evaluated 10/31/2021 after he bent over and stood back up and when he got dizzy.  He then tripped and landed on the concrete.  No head injury or loss of consciousness. Rib pain resolved.   CXR and left rib without acute findings  No history of TIA.  History of Bell's palsy.  never evaluated by cardiology. Former smoker   Unremarkable bilateral carotid ultrasound 11/24/2014    HISTORY:  Past Medical History:  Diagnosis Date   Bell's palsy    [place]    Hyperlipidemia    Hypertension    Past Surgical History:  Procedure Laterality Date   HERNIA REPAIR     Family History  Problem Relation Age of Onset   Cancer Mother    Cancer Father    Diabetes Father    Hypertension Father    Heart disease Brother    Hypertension Brother     Allergies: Patient has no known allergies. Current Outpatient Medications on File Prior to Visit  Medication Sig Dispense Refill   aspirin EC 81 MG tablet Take 81 mg by mouth daily.     blood glucose meter kit and supplies KIT Dispense based on patient and insurance preference. Use up to four times daily as directed. (FOR ICD-9 250.00, 250.01). 1 each 0   Cholecalciferol (VITAMIN D3) 2000 UNITS TABS Take  4,000 Units by mouth 2 (two) times daily.     Dapagliflozin-metFORMIN HCl ER (XIGDUO XR) 01-999 MG TB24 Take 2 tablets by mouth every morning. 180 tablet 0   ELDERBERRY PO Take by mouth.     ibuprofen (ADVIL,MOTRIN) 800 MG tablet Take 800 mg by mouth 2 (two) times daily.     Omega-3 Fatty Acids (FISH OIL) 1000 MG CAPS Take by mouth.     rosuvastatin (CRESTOR) 5 MG tablet Take 1 tablet (5 mg total) by mouth daily. 90 tablet 1   Semaglutide ([name]) 14 MG TABS Take 14 mg by mouth daily. 90 tablet 1   vitamin B-12 (CYANOCOBALAMIN) 1000 MCG tablet Take 1,000 mcg by mouth daily.     No current facility-administered medications on file prior to visit.    Social History   Tobacco Use   Smoking status: Former    Types: Cigarettes    Quit date: 05/31/1981    Years since quitting: 40.4   Smokeless tobacco: Never  Vaping Use   Vaping Use: Never used  Substance Use Topics   Alcohol use: No    Alcohol/week: 0.0 standard drinks   Drug use: No    Review of Systems  Constitutional:  Negative for chills and fever.  HENT:  Negative for congestion, ear pain, rhinorrhea, sinus pressure and sore throat.   Respiratory:  Negative for cough, shortness of breath and wheezing.   Cardiovascular:  Negative for chest pain and palpitations.  Gastrointestinal:  Negative for diarrhea, nausea and vomiting.  Genitourinary:  Negative for dysuria.  Musculoskeletal:  Negative for myalgias.  Skin:  Negative for rash.  Neurological:  Negative for dizziness (resolved), syncope and headaches.  Hematological:  Negative for adenopathy.     Objective:    BP 138/76 (BP Location: Left Arm, Patient Position: Sitting, Cuff Size: Large)    Pulse (!) 102    Temp 97.6 F (36.4 C) (Oral)    Ht _0  (1.88 m)    Wt 261 lb 9.6 oz (118.7 kg)    SpO2 96%    BMI 33.59 kg/m    Physical Exam Vitals reviewed.  Constitutional:      Appearance: He is well-developed.  Cardiovascular:     Rate and Rhythm: Regular rhythm.      Heart sounds: Normal heart sounds.  Pulmonary:     Effort: Pulmonary effort is normal. No respiratory distress.     Breath sounds: Normal breath sounds. No wheezing, rhonchi or rales.  Skin:    General: Skin is warm and dry.  Neurological:     Mental Status: He is alert.  Psychiatric:        Speech: Speech normal.        Behavior: Behavior normal.       Assessment & Plan:   Problem List Items Addressed This Visit       Cardiovascular and Mediastinum   Hypertension   Relevant Medications   lisinopril (ZESTRIL) 10 MG tablet   lisinopril (ZESTRIL) 5 MG tablet     Endocrine   DM type 2, controlled, with complication (HCC)    N4O well controlled.  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  We have opted to increase lisinopril from 10 mg to 15 mg and see if he can tolerate this better than the 20 mg dose.  Encouraged him to be mindful position changes especially as we escalate lisinopril.  Encouraged him to drink plenty of water particularly as he has a physically active job.  We also discussed increasing [name] from 5 mg to 10 mg and also consulting nephrology most certainly if microalbuminuria does not improve      Relevant Medications   lisinopril (ZESTRIL) 10 MG tablet   lisinopril (ZESTRIL) 5 MG tablet     Other   Dizziness - Primary    Borderline orthostatic (see flowsheet).  Encouraged to be careful with position changes.  EKG shows sinus rhythm without acute changes from prior EKG obtained 08/26/2012. Reviewed EKG and case with  Dr Derrel Nip.  Concern for widened QRS and possible conduction abnormality.  We agreed consult with cardiology for further evaluation and most certainly restratification in the setting of longstanding hypertension, diabetes.  Patient  declined this at time of visit as he is very busy at work right now.  Call out to patient to reiterate the importance of seeing cardiology.  Will follow      Relevant Orders   EKG 12-Lead (Completed)      I am having  Dustin Fletcher "[name]" start on lisinopril. I am also having him maintain his Vitamin D3, aspirin EC, vitamin B-12, Fish Oil, ibuprofen, ELDERBERRY PO, blood glucose meter kit and supplies, rosuvastatin, [name], Xigduo XR, and lisinopril.   Meds ordered this encounter  Medications   lisinopril (ZESTRIL) 10 MG tablet    Sig: Take 1 tablet (10 mg total) by mouth  daily.    Dispense:  90 tablet    Refill:  3    Order Specific Question:   Supervising Provider    Answer:   Deborra Medina L [[id number]]   lisinopril (ZESTRIL) 5 MG tablet    Sig: Take 1 tablet (5 mg total) by mouth daily.    Dispense:  90 tablet    Refill:  1    Order Specific Question:   Supervising Provider    Answer:   Crecencio Mc [[id number]]    Return precautions given.   Risks, benefits, and alternatives of the medications and treatment plan prescribed today were discussed, and patient expressed understanding.   Education regarding symptom management and diagnosis given to patient on AVS.  Continue to follow with [name], [name] for routine health maintenance.   Dustin Fletcher and I agreed with plan.   Dustin Paris, [name]

## 2021-11-08 ENCOUNTER — Encounter: Payer: Self-pay | Admitting: Family

## 2021-11-08 ENCOUNTER — Telehealth: Payer: Self-pay | Admitting: Family

## 2021-11-08 NOTE — Telephone Encounter (Signed)
Spoke with patient about cardiology referral he stated he would check his schedule and see when he was able to do it, and would contact us back to schedule appointment.

## 2021-11-08 NOTE — Telephone Encounter (Signed)
Call pt  Call pt  I consulted with Dr Derrel Nip whom I advised him that I would in review of his EKG  She agreed that EKG was abnormal, concern for conduction abnormality and she felt strongly he would need further evaluation with cardiology  I know he is in the busy season with work at [employer] however, I would advise that he prioritize this appointment and not delay until Summer months.   If agreeable, please place referral to cardiology Reason: abnormal EKG, longstanding history hypertension, diabetes, risk stratification

## 2021-11-10 NOTE — Telephone Encounter (Signed)
Which Cardiology office would you like me to schedule the appointment at ?

## 2021-11-11 ENCOUNTER — Other Ambulatory Visit: Payer: Self-pay | Admitting: Family

## 2021-11-11 DIAGNOSIS — R42 Dizziness and giddiness: Secondary | ICD-10-CM

## 2021-11-17 ENCOUNTER — Other Ambulatory Visit: Payer: Self-pay | Admitting: Family

## 2021-11-17 DIAGNOSIS — E119 Type 2 diabetes mellitus without complications: Secondary | ICD-10-CM

## 2021-11-29 ENCOUNTER — Other Ambulatory Visit: Payer: BC Managed Care – PPO

## 2021-11-29 ENCOUNTER — Other Ambulatory Visit: Payer: Self-pay

## 2021-11-29 DIAGNOSIS — Z0000 Encounter for general adult medical examination without abnormal findings: Secondary | ICD-10-CM

## 2021-11-30 LAB — HGB A1C W/O EAG: Hgb A1c MFr Bld: 6.6 % — ABNORMAL HIGH (ref 4.8–5.6)

## 2022-02-20 ENCOUNTER — Encounter: Payer: Self-pay | Admitting: Family

## 2022-02-20 ENCOUNTER — Telehealth: Payer: Self-pay | Admitting: Family

## 2022-02-20 ENCOUNTER — Ambulatory Visit: Payer: BC Managed Care – PPO | Admitting: Family

## 2022-02-20 VITALS — BP 124/70 | HR 97 | Temp 98.0°F | Ht 74.0 in | Wt 262.8 lb

## 2022-02-20 DIAGNOSIS — G6289 Other specified polyneuropathies: Secondary | ICD-10-CM | POA: Diagnosis not present

## 2022-02-20 DIAGNOSIS — R42 Dizziness and giddiness: Secondary | ICD-10-CM

## 2022-02-20 DIAGNOSIS — G629 Polyneuropathy, unspecified: Secondary | ICD-10-CM | POA: Insufficient documentation

## 2022-02-20 DIAGNOSIS — E118 Type 2 diabetes mellitus with unspecified complications: Secondary | ICD-10-CM

## 2022-02-20 DIAGNOSIS — E7800 Pure hypercholesterolemia, unspecified: Secondary | ICD-10-CM

## 2022-02-20 MED ORDER — RYBELSUS 14 MG PO TABS: 14.0000 mg | ORAL_TABLET | Freq: Every day | ORAL | 1 refills | Status: DC

## 2022-02-20 NOTE — Assessment & Plan Note (Signed)
Pending a1c. He never increased lisinopril to 15mg . Will await urine micro.  Continue [name] 14 mg

## 2022-02-20 NOTE — Assessment & Plan Note (Signed)
Lab Results  Component Value Date   LDLCALC 57 09/13/2021   Excellent control. Continue crestor 5mg 

## 2022-02-20 NOTE — Patient Instructions (Signed)
Please ensure that you do schedule colonoscopy and return gastroenterology phone call as well.  We have scheduled for cardiology as discussed today

## 2022-02-20 NOTE — Progress Notes (Signed)
Subjective:    Patient ID: Dustin Fletcher, male    DOB: 11-Apr-1961, 61 y.o.   MRN: [id number]  CC: Dustin Fletcher is a 61 y.o. male who presents today for follow up.   HPI: Reports 'every once and while will have numbness' in feet.      No calve pain with walking. No leg swelling.   HTN-he is compliant with lisinopril 10 mg for microalbuminuria.  Every once in a while will have brief dizziness. Non recurrence of syncopal episode. No CP.   He is compliant with [name] 14 mg  Hyperlipidemia-compliant with Crestor 5 mg   He never made cardiology follow up  He plans to colonoscopy later in the year.  HISTORY:  Past Medical History:  Diagnosis Date   Bell's palsy    Diabetes mellitus without complication ([place])    Hyperlipidemia    Hypertension    Past Surgical History:  Procedure Laterality Date   HERNIA REPAIR     Family History  Problem Relation Age of Onset   Cancer Mother    Cancer Father    Diabetes Father    Hypertension Father    Heart disease Brother    Hypertension Brother     Allergies: Patient has no known allergies. Current Outpatient Medications on File Prior to Visit  Medication Sig Dispense Refill   aspirin EC 81 MG tablet Take 81 mg by mouth daily.     blood glucose meter kit and supplies KIT Dispense based on patient and insurance preference. Use up to four times daily as directed. (FOR ICD-9 250.00, 250.01). 1 each 0   Cholecalciferol (VITAMIN D3) 2000 UNITS TABS Take 4,000 Units by mouth 2 (two) times daily.     ELDERBERRY PO Take by mouth.     ibuprofen (ADVIL,MOTRIN) 800 MG tablet Take 800 mg by mouth 2 (two) times daily.     lisinopril (ZESTRIL) 10 MG tablet Take 1 tablet (10 mg total) by mouth daily. 90 tablet 3   lisinopril (ZESTRIL) 5 MG tablet Take 1 tablet (5 mg total) by mouth daily. 90 tablet 1   Omega-3 Fatty Acids (FISH OIL) 1000 MG CAPS Take by mouth.     rosuvastatin (CRESTOR) 5 MG tablet Take 1 tablet (5 mg total) by  mouth daily. 90 tablet 1   vitamin B-12 (CYANOCOBALAMIN) 1000 MCG tablet Take 1,000 mcg by mouth daily.     XIGDUO XR 01-999 MG TB24 TAKE TWO TABLETS BY MOUTH EVERY MORNING 180 tablet 0   No current facility-administered medications on file prior to visit.    Social History   Tobacco Use   Smoking status: Former    Types: Cigarettes    Quit date: 05/31/1981    Years since quitting: 40.7   Smokeless tobacco: Never  Vaping Use   Vaping Use: Never used  Substance Use Topics   Alcohol use: No    Alcohol/week: 0.0 standard drinks of alcohol   Drug use: No    Review of Systems  Constitutional:  Negative for chills and fever.  Respiratory:  Negative for cough.   Cardiovascular:  Negative for chest pain and palpitations.  Gastrointestinal:  Negative for nausea and vomiting.  Neurological:  Positive for numbness.      Objective:    BP 124/70 (BP Location: Left Arm, Patient Position: Sitting, Cuff Size: Normal)   Pulse 97   Temp 98 F (36.7 C) (Oral)   Ht '6\' 2"'  (1.88 m)   Wt 262 lb 12.8  oz (119.2 kg)   SpO2 98%   BMI 33.74 kg/m  BP Readings from Last 3 Encounters:  02/20/22 124/70  11/07/21 138/76  10/31/21 138/78   Wt Readings from Last 3 Encounters:  02/20/22 262 lb 12.8 oz (119.2 kg)  11/07/21 261 lb 9.6 oz (118.7 kg)  08/24/21 261 lb (118.4 kg)    Physical Exam Vitals reviewed.  Constitutional:      Appearance: He is well-developed.  Cardiovascular:     Rate and Rhythm: Regular rhythm.     Heart sounds: Normal heart sounds.  Pulmonary:     Effort: Pulmonary effort is normal. No respiratory distress.     Breath sounds: Normal breath sounds. No wheezing, rhonchi or rales.  Skin:    General: Skin is warm and dry.  Neurological:     Mental Status: He is alert.  Psychiatric:        Speech: Speech normal.        Behavior: Behavior normal.        Assessment & Plan:   Problem List Items Addressed This Visit       Endocrine   DM type 2, controlled, with  complication ([place]) - Primary    Pending a1c. He never increased lisinopril to 71m. Will await urine micro.  Continue [name] 14 mg      Relevant Medications   Semaglutide ([name]) 14 MG TABS   Other Relevant Orders   Microalbumin / creatinine urine ratio   Hemoglobin A1c   Comprehensive metabolic panel     Nervous and Auditory   Peripheral neuropathy   Relevant Orders   B12 and Folate Panel   Homocysteine   Intrinsic Factor Antibodies   Methylmalonic acid, serum     Other   Dizziness    Episodes are less severe. No associated CP.  I was able to schedule patient with Dustin AFletcher Anonfor next month due to abnormal EKG. Will follow.       Hyperlipidemia    Lab Results  Component Value Date   LDLCALC 57 09/13/2021  Excellent control. Continue crestor 543m       I have changed Dustin Fletcher "Dustin Fletcher"'s [name]. I am also having him maintain his Vitamin D3, aspirin EC, vitamin B-12, Fish Oil, ibuprofen, ELDERBERRY PO, blood glucose meter kit and supplies, rosuvastatin, lisinopril, lisinopril, and Xigduo XR.   Meds ordered this encounter  Medications   Semaglutide ([name]) 14 MG TABS    Sig: Take 1 tablet (14 mg total) by mouth daily.    Dispense:  90 tablet    Refill:  1    Please dc 1m75mose for two tabs qd. This new script is correct.    Order Specific Question:   Supervising Provider    Answer:   Dustin Fletcher]    Return precautions given.   Risks, benefits, and alternatives of the medications and treatment plan prescribed today were discussed, and patient expressed understanding.   Education regarding symptom management and diagnosis given to patient on AVS.  Continue to follow with Dustin Fletcher for routine health maintenance.   Dustin Fletcher I agreed with plan.   Dustin Fletcher

## 2022-02-20 NOTE — Assessment & Plan Note (Signed)
Episodes are less severe. No associated CP.  I was able to schedule patient with Dr Kirke Corin for next month due to abnormal EKG. Will follow.

## 2022-02-22 NOTE — Telephone Encounter (Signed)
close

## 2022-02-23 ENCOUNTER — Other Ambulatory Visit: Payer: BC Managed Care – PPO

## 2022-02-23 DIAGNOSIS — G6289 Other specified polyneuropathies: Secondary | ICD-10-CM

## 2022-02-23 DIAGNOSIS — E118 Type 2 diabetes mellitus with unspecified complications: Secondary | ICD-10-CM

## 2022-02-24 LAB — MICROALBUMIN / CREATININE URINE RATIO
Creatinine, Urine: 94 mg/dL
Microalb/Creat Ratio: 6 mg/g creat (ref 0–29)
Microalbumin, Urine: 5.7 ug/mL

## 2022-02-26 ENCOUNTER — Other Ambulatory Visit: Payer: Self-pay | Admitting: Family

## 2022-02-26 DIAGNOSIS — E119 Type 2 diabetes mellitus without complications: Secondary | ICD-10-CM

## 2022-02-26 LAB — COMPREHENSIVE METABOLIC PANEL
ALT: 18 IU/L (ref 0–44)
AST: 16 IU/L (ref 0–40)
Albumin/Globulin Ratio: 1.8 (ref 1.2–2.2)
Albumin: 4.6 g/dL (ref 3.8–4.9)
Alkaline Phosphatase: 66 IU/L (ref 44–121)
BUN/Creatinine Ratio: 21 (ref 10–24)
BUN: 19 mg/dL (ref 8–27)
Bilirubin Total: 0.5 mg/dL (ref 0.0–1.2)
CO2: 18 mmol/L — ABNORMAL LOW (ref 20–29)
Calcium: 9.8 mg/dL (ref 8.6–10.2)
Chloride: 102 mmol/L (ref 96–106)
Creatinine, Ser: 0.9 mg/dL (ref 0.76–1.27)
Globulin, Total: 2.6 g/dL (ref 1.5–4.5)
Glucose: 122 mg/dL — ABNORMAL HIGH (ref 70–99)
Potassium: 4.6 mmol/L (ref 3.5–5.2)
Sodium: 135 mmol/L (ref 134–144)
Total Protein: 7.2 g/dL (ref 6.0–8.5)
eGFR: 98 mL/min/{1.73_m2} (ref >59)

## 2022-02-26 LAB — INTRINSIC FACTOR ANTIBODIES: Intrinsic Factor Abs, Serum: 1 AU/mL (ref 0.0–1.1)

## 2022-02-26 LAB — HGB A1C W/O EAG: Hgb A1c MFr Bld: 6.6 % — ABNORMAL HIGH (ref 4.8–5.6)

## 2022-02-26 LAB — B12 AND FOLATE PANEL
Folate: 14 ng/mL (ref >3.0)
Vitamin B-12: 756 pg/mL (ref 232–1245)

## 2022-02-26 LAB — HOMOCYSTEINE: Homocysteine: 10.5 umol/L (ref 0.0–14.5)

## 2022-02-27 LAB — METHYLMALONIC ACID, SERUM: Methylmalonic Acid: 116 nmol/L (ref 0–378)

## 2022-02-27 MED ORDER — XIGDUO XR 5-1000 MG PO TB24: 2.0000 | ORAL_TABLET | Freq: Every morning | ORAL | 0 refills | Status: DC

## 2022-04-04 ENCOUNTER — Encounter: Payer: Self-pay | Admitting: Cardiovascular Disease

## 2022-04-04 ENCOUNTER — Ambulatory Visit: Payer: BC Managed Care – PPO | Admitting: Cardiovascular Disease

## 2022-04-04 VITALS — BP 115/70 | HR 148 | Ht 74.0 in | Wt 261.5 lb

## 2022-04-04 DIAGNOSIS — R42 Dizziness and giddiness: Secondary | ICD-10-CM

## 2022-04-04 DIAGNOSIS — E7800 Pure hypercholesterolemia, unspecified: Secondary | ICD-10-CM

## 2022-04-04 DIAGNOSIS — I4892 Unspecified atrial flutter: Secondary | ICD-10-CM | POA: Diagnosis not present

## 2022-04-04 MED ORDER — APIXABAN 5 MG PO TABS: 5.0000 mg | ORAL_TABLET | Freq: Two times a day (BID) | ORAL | 6 refills | Status: DC

## 2022-04-04 MED ORDER — METOPROLOL TARTRATE 25 MG PO TABS: ORAL_TABLET | ORAL | 6 refills | Status: DC

## 2022-04-04 NOTE — Progress Notes (Signed)
Cardiology Office Note   Date:  04/04/2022   ID:  Dustin Fletcher, DOB 09-Sep-1961, MRN [id number]  PCP:  [name], [name]  Cardiologist:   Kathlyn Sacramento, MD   Chief Complaint  Patient presents with   Other    Elevated HR; pt was on Lisinopril 20 mg and was having low BP before Lisinopril was decreased. Meds reviewed verbally with pt.      History of Present Illness: Dustin Fletcher is a 61 y.o. male who was referred by [name] for evaluation of dizziness and an abnormal EKG.  He has past medical history of diabetes mellitus, essential hypertension and hyperlipidemia.  The dose of lisinopril was increased from 10 to 20 mg earlier this year.  Shortly after that he had a presyncopal episode and hypotension.  He was noted to have slightly prolonged QRS and thus he was referred for evaluation.  No prior cardiac history.  In our office, he was noted to be tachycardic.  An EKG was performed which showed atrial flutter with variable ventricular conduction and a heart rate of 148 bpm.  In spite of this, he denies chest pain, shortness of breath or palpitations.  He reports that his brother has history of atrial fibrillation and underwent ablation.  The patient is a non-smoker and does not drink alcohol.  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  No prior cardiac work-up.  He has chronic symptoms of orthostatic dizziness.    Past Medical History:  Diagnosis Date   Bell's palsy    Diabetes mellitus without complication ([place])    Hyperlipidemia    Hypertension     Past Surgical History:  Procedure Laterality Date   HERNIA REPAIR       Current Outpatient Medications  Medication Sig Dispense Refill   blood glucose meter kit and supplies KIT Dispense based on patient and insurance preference. Use up to four times daily as directed. (FOR ICD-9 250.00, 250.01). 1 each 0   Cholecalciferol (VITAMIN D3) 2000 UNITS TABS Take 4,000 Units by mouth 2 (two) times daily.      Dapagliflozin-metFORMIN HCl ER (XIGDUO XR) 01-999 MG TB24 Take 2 tablets by mouth every morning. 180 tablet 0   ELDERBERRY PO Take by mouth.     ibuprofen (ADVIL,MOTRIN) 800 MG tablet Take 800 mg by mouth 2 (two) times daily.     lisinopril (ZESTRIL) 10 MG tablet Take 1 tablet (10 mg total) by mouth daily. 90 tablet 3   lisinopril (ZESTRIL) 5 MG tablet Take 1 tablet (5 mg total) by mouth daily. 90 tablet 1   Omega-3 Fatty Acids (FISH OIL) 1000 MG CAPS Take by mouth daily at 12 noon.     rosuvastatin (CRESTOR) 5 MG tablet Take 1 tablet (5 mg total) by mouth daily. 90 tablet 1   Semaglutide ([name]) 14 MG TABS Take 1 tablet (14 mg total) by mouth daily. 90 tablet 1   vitamin B-12 (CYANOCOBALAMIN) 1000 MCG tablet Take 1,000 mcg by mouth daily.     No current facility-administered medications for this visit.    Allergies:   Patient has no known allergies.    Social History:  The patient  reports that he quit smoking about 40 years ago. His smoking use included cigarettes. He has never used smokeless tobacco. He reports that he does not drink alcohol and does not use drugs.   Family History:  The patient's family history includes Cancer in his father and mother; Diabetes in his  father; Heart disease in his brother; Hypertension in his brother and father.    ROS:  Please see the history of present illness.   Otherwise, review of systems are positive for none.   All other systems are reviewed and negative.    PHYSICAL EXAM: VS:  BP 115/70 (BP Location: Right Arm, Patient Position: Sitting, Cuff Size: Large)   Pulse (!) 148   Ht '6\' 2"'  (1.88 m)   Wt 261 lb 8 oz (118.6 kg)   SpO2 98%   BMI 33.57 kg/m  , BMI Body mass index is 33.57 kg/m. GEN: Well nourished, well developed, in no acute distress  HEENT: normal  Neck: no JVD, carotid bruits, or masses Cardiac: Irregularly irregular and tachycardic; no murmurs, rubs, or gallops,no edema  Respiratory:  clear to auscultation bilaterally,  normal work of breathing GI: soft, nontender, nondistended, + BS MS: no deformity or atrophy  Skin: warm and dry, no rash Neuro:  Strength and sensation are intact Psych: euthymic mood, full affect   EKG:  EKG is ordered today. The ekg ordered today demonstrates atrial flutter with variable ventricular conduction with a heart rate of 148 bpm.   Recent Labs: 09/13/2021: Hemoglobin 15.5; Platelets 241; TSH 0.982 02/23/2022: ALT 18; BUN 19; Creatinine, Ser 0.90; Potassium 4.6; Sodium 135    Lipid Panel    Component Value Date/Time   CHOL 120 09/13/2021 0748   TRIG 171 (H) 09/13/2021 0748   HDL 34 (L) 09/13/2021 0748   CHOLHDL 3.5 09/13/2021 0748   LDLCALC 57 09/13/2021 0748      Wt Readings from Last 3 Encounters:  04/04/22 261 lb 8 oz (118.6 kg)  02/20/22 262 lb 12.8 oz (119.2 kg)  11/07/21 261 lb 9.6 oz (118.7 kg)         04/04/2022    3:08 PM  PAD Screen  Previous PAD dx? No  Previous surgical procedure? No  Pain with walking? No  Feet/toe relief with dangling? No  Painful, non-healing ulcers? No  Extremities discolored? No      ASSESSMENT AND PLAN:  1.  Atrial flutter with rapid ventricular response: This is an incidental finding and the patient seems to be asymptomatic.  However, this has to be treated to prevent tachycardia induced cardiomyopathy.  I elected to discontinue lisinopril and add metoprolol tartrate 25 mg twice daily for rate control.  I requested an echocardiogram.  His CHA2DS2-VASc score is 2.  Thus, I started him on anticoagulation with Eliquis 5 mg twice daily.  We will have the patient follow-up after his echocardiogram.  If he remains in atrial flutter, recommend proceeding with cardioversion.  2.  Dizziness: Suspect is more likely to be related to orthostatic dizziness  3.  Hyperlipidemia: Currently on rosuvastatin 5 mg daily.  Most recent lipid profile showed an LDL of 57.    Disposition:   FU after echocardiogram.  Signed,  Kathlyn Sacramento, MD  04/04/2022 3:19 PM    [place]

## 2022-04-04 NOTE — Patient Instructions (Addendum)
Medication Instructions:  - Your physician has recommended you make the following change in your medication:   1) STOP Lisinopril  2) START Lopressor (Metoprolol Tartrate) 25 mg: - take 1 tablet by mouth TWICE daily (or every 12 hours)  3) START Eliquis 5 mg: - take 1 tablet by mouth TWICE daily (or every 12 hours)   Samples Given: Eliquis 5 mg Lot: [id number] Exp: March 2025 # 1 box  Lot: [id number] Exp: April 2025 # 1 box   *If you need a refill on your cardiac medications before your next appointment, please call your pharmacy*   Lab Work: - none ordered  If you have labs (blood work) drawn today and your tests are completely normal, you will receive your results only by: MyChart Message (if you have MyChart) OR A paper copy in the mail If you have any lab test that is abnormal or we need to change your treatment, we will call you to review the results.   Testing/Procedures:  1) Echocardiogram: - Your physician has requested that you have an echocardiogram. Echocardiography is a painless test that uses sound waves to create images of your heart. It provides your doctor with information about the size and shape of your heart and how well your heart's chambers and valves are working. This procedure takes approximately one hour. There are no restrictions for this procedure. There is a possibility that an IV may need to be started during your test to inject an image enhancing agent. This is done to obtain more optimal pictures of your heart. Therefore we ask that you do at least drink some water prior to coming in to hydrate your veins.     Follow-Up: At [facility], you and your health needs are our priority.  As part of our continuing mission to provide you with exceptional heart care, we have created designated Provider Care Teams.  These Care Teams include your primary Cardiologist (physician) and Advanced Practice Providers (APPs -  Physician Assistants and Nurse  Practitioners) who all work together to provide you with the care you need, when you need it.  We recommend signing up for the patient portal called "MyChart".  Sign up information is provided on this After Visit Summary.  MyChart is used to connect with patients for Virtual Visits (Telemedicine).  Patients are able to view lab/test results, encounter notes, upcoming appointments, etc.  Non-urgent messages can be sent to your provider as well.   To learn more about what you can do with MyChart, go to [email or web address].    Your next appointment:   3-4 week(s)  The format for your next appointment:   In Person  Provider:   You may see Lorine Bears, MD or one of the following Advanced Practice Providers on your designated Care Team:   Nicolasa Ducking, NP Eula Listen, PA-C Cadence Fransico [place], [place]    Other Instructions  Metoprolol Tablets What is this medication? METOPROLOL (me TOE proe lole) treats high blood pressure. It also prevents chest pain (angina) or further damage after a heart attack. It works by lowering your blood pressure and heart rate, making it easier for your heart to pump blood to the rest of your body. It belongs to a group of medications called beta blockers. This medicine may be used for other purposes; ask your health care provider or pharmacist if you have questions. COMMON BRAND NAME(S): Lopressor What should I tell my care team before I take this medication? They need to know if you  have any of these conditions: Diabetes Heart or vessel disease like slow heart rate, worsening heart failure, heart block, sick sinus syndrome, or Raynaud's disease Kidney disease Liver disease Lung or breathing disease, like asthma or emphysema Pheochromocytoma Thyroid disease An unusual or allergic reaction to metoprolol, other beta blockers, medications, foods, dyes, or preservatives Pregnant or trying to get pregnant Breast-feeding How should I use this medication? Take  this medication by mouth with water. Take it as directed on the prescription label at the same time every day. You can take it with or without food. You should always take it the same way. Keep taking it unless your care team tells you to stop. Talk to your care team about the use of this medication in children. Special care may be needed. Overdosage: If you think you have taken too much of this medicine contact a poison control center or emergency room at once. NOTE: This medicine is only for you. Do not share this medicine with others. What if I miss a dose? If you miss a dose, take it as soon as you can. If it is almost time for your next dose, take only that dose. Do not take double or extra doses. What may interact with this medication? This medication may interact with the following: Certain medications for blood pressure, heart disease, irregular heartbeat Certain medications for depression like monoamine oxidase (MAO) inhibitors, fluoxetine, or paroxetine Clonidine Dobutamine Epinephrine Isoproterenol Reserpine This list may not describe all possible interactions. Give your health care provider a list of all the medicines, herbs, non-prescription drugs, or dietary supplements you use. Also tell them if you smoke, drink alcohol, or use illegal drugs. Some items may interact with your medicine. What should I watch for while using this medication? Visit your care team for regular checks on your progress. Check your blood pressure as directed. Ask your care team what your blood pressure should be. Also, find out when you should contact them. Do not treat yourself for coughs, colds, or pain while you are using this medication without asking your care team for advice. Some medications may increase your blood pressure. You may get drowsy or dizzy. Do not drive, use machinery, or do anything that needs mental alertness until you know how this medication affects you. Do not stand up or sit up  quickly, especially if you are an older patient. This reduces the risk of dizzy or fainting spells. Alcohol may interfere with the effect of this medication. Avoid alcoholic drinks. This medication may increase blood sugar. Ask your care team if changes in diet or medications are needed if you have diabetes. What side effects may I notice from receiving this medication? Side effects that you should report to your care team as soon as possible: Allergic reactions--skin rash, itching, hives, swelling of the face, lips, tongue, or throat Heart failure--shortness of breath, swelling of the ankles, feet, or hands, sudden weight gain, unusual weakness or fatigue Low blood pressure--dizziness, feeling faint or lightheaded, blurry vision Raynaud's--cool, numb, or painful fingers or toes that may change color from pale, to blue, to red Slow heartbeat--dizziness, feeling faint or lightheaded, confusion, trouble breathing, unusual weakness or fatigue Worsening mood, feelings of depression Side effects that usually do not require medical attention (report to your care team if they continue or are bothersome): Change in sex drive or performance Diarrhea Dizziness Fatigue Headache This list may not describe all possible side effects. Call your doctor for medical advice about side effects. You may  report side effects to FDA at 1-800-FDA-1088. Where should I keep my medication? Keep out of the reach of children and pets. Store at room temperature between 15 and 30 degrees C (59 and 86 degrees F). Protect from moisture. Keep the container tightly closed. Throw away any unused medication after the expiration date. NOTE: This sheet is a summary. It may not cover all possible information. If you have questions about this medicine, talk to your doctor, pharmacist, or health care provider.  2023 Elsevier/Gold Standard (2021-07-29 00:00:00)     Apixaban Tablets What is this medication? APIXABAN (a PIX a ban)  prevents or treats blood clots. It is also used to lower the risk of stroke in people with AFib (atrial fibrillation). It belongs to a group of medications called blood thinners. This medicine may be used for other purposes; ask your health care provider or pharmacist if you have questions. COMMON BRAND NAME(S): Eliquis What should I tell my care team before I take this medication? They need to know if you have any of these conditions: Antiphospholipid antibody syndrome Bleeding disorder History of bleeding in the brain History of blood clots History of stomach bleeding Kidney disease Liver disease Mechanical heart valve Spinal surgery An unusual or allergic reaction to apixaban, other medications, foods, dyes, or preservatives Pregnant or trying to get pregnant Breast-feeding How should I use this medication? Take this medication by mouth. For your therapy to work as well as possible, take each dose exactly as prescribed on the prescription label. Do not skip doses. Skipping doses or stopping this medication can increase your risk of a blood clot or stroke. Keep taking this medication unless your care team tells you to stop. Take it as directed on the prescription label at the same time every day. You can take it with or without food. If it upsets your stomach, take it with food. A special MedGuide will be given to you by the pharmacist with each prescription and refill. Be sure to read this information carefully each time. Talk to your care team about the use of this medication in children. Special care may be needed. Overdosage: If you think you have taken too much of this medicine contact a poison control center or emergency room at once. NOTE: This medicine is only for you. Do not share this medicine with others. What if I miss a dose? If you miss a dose, take it as soon as you can. If it is almost time for your next dose, take only that dose. Do not take double or extra doses. What may  interact with this medication? This medication may interact with the following: Aspirin and aspirin-like medications Certain medications for fungal infections like itraconazole and ketoconazole Certain medications for seizures like carbamazepine and phenytoin Certain medications for blood clots like enoxaparin, dalteparin, heparin, and warfarin Clarithromycin NSAIDs, medications for pain and inflammation, like ibuprofen or naproxen Rifampin Ritonavir St. John's wort This list may not describe all possible interactions. Give your health care provider a list of all the medicines, herbs, non-prescription drugs, or dietary supplements you use. Also tell them if you smoke, drink alcohol, or use illegal drugs. Some items may interact with your medicine. What should I watch for while using this medication? Visit your healthcare professional for regular checks on your progress. You may need blood work done while you are taking this medication. Your condition will be monitored carefully while you are receiving this medication. It is important not to miss any appointments. Avoid  sports and activities that might cause injury while you are using this medication. Severe falls or injuries can cause unseen bleeding. Be careful when using sharp tools or knives. Consider using an Neurosurgeon. Take special care brushing or flossing your teeth. Report any injuries, bruising, or red spots on the skin to your healthcare professional. If you are going to need surgery or other procedure, tell your healthcare professional that you are taking this medication. Wear a medical ID bracelet or chain. Carry a card that describes your disease and details of your medication and dosage times. What side effects may I notice from receiving this medication? Side effects that you should report to your care team as soon as possible: Allergic reactions--skin rash, itching, hives, swelling of the face, lips, tongue, or  throat Bleeding--bloody or black, tar-like stools, vomiting blood or brown material that looks like coffee grounds, red or dark brown urine, small red or purple spots on the skin, unusual bruising or bleeding Bleeding in the brain--severe headache, stiff neck, confusion, dizziness, change in vision, numbness or weakness of the face, arm, or leg, trouble speaking, trouble walking, vomiting Heavy periods This list may not describe all possible side effects. Call your doctor for medical advice about side effects. You may report side effects to FDA at 1-800-FDA-1088. Where should I keep my medication? Keep out of the reach of children and pets. Store at room temperature between 20 and 25 degrees C (68 and 77 degrees F). Get rid of any unused medication after the expiration date. To get rid of medications that are no longer needed or expired: Take the medication to a medication take-back program. Check with your pharmacy or law enforcement to find a location. If you cannot return the medication, check the label or package insert to see if the medication should be thrown out in the garbage or flushed down the toilet. If you are not sure, ask your care team. If it is safe to put in the trash, empty the medication out of the container. Mix the medication with cat litter, dirt, coffee grounds, or other unwanted substance. Seal the mixture in a bag or container. Put it in the trash. NOTE: This sheet is a summary. It may not cover all possible information. If you have questions about this medicine, talk to your doctor, pharmacist, or health care provider.  2023 Elsevier/Gold Standard (2020-09-24 00:00:00)    Echocardiogram An echocardiogram is a test that uses sound waves (ultrasound) to produce images of the heart. Images from an echocardiogram can provide important information about: Heart size and shape. The size and thickness and movement of your heart's walls. Heart muscle function and  strength. Heart valve function or if you have stenosis. Stenosis is when the heart valves are too narrow. If blood is flowing backward through the heart valves (regurgitation). A tumor or infectious growth around the heart valves. Areas of heart muscle that are not working well because of poor blood flow or injury from a heart attack. Aneurysm detection. An aneurysm is a weak or damaged part of an artery wall. The wall bulges out from the normal force of blood pumping through the body. Tell a health care provider about: Any allergies you have. All medicines you are taking, including vitamins, herbs, eye drops, creams, and over-the-counter medicines. Any blood disorders you have. Any surgeries you have had. Any medical conditions you have. Whether you are pregnant or may be pregnant. What are the risks? Generally, this is a safe test. However,  problems may occur, including an allergic reaction to dye (contrast) that may be used during the test. What happens before the test? No specific preparation is needed. You may eat and drink normally. What happens during the test?  You will take off your clothes from the waist up and put on a hospital gown. Electrodes or electrocardiogram (ECG)patches may be placed on your chest. The electrodes or patches are then connected to a device that monitors your heart rate and rhythm. You will lie down on a table for an ultrasound exam. A gel will be applied to your chest to help sound waves pass through your skin. A handheld device, called a transducer, will be pressed against your chest and moved over your heart. The transducer produces sound waves that travel to your heart and bounce back (or "echo" back) to the transducer. These sound waves will be captured in real-time and changed into images of your heart that can be viewed on a video monitor. The images will be recorded on a computer and reviewed by your health care provider. You may be asked to change  positions or hold your breath for a short time. This makes it easier to get different views or better views of your heart. In some cases, you may receive contrast through an IV in one of your veins. This can improve the quality of the pictures from your heart. The procedure may vary among health care providers and hospitals. What can I expect after the test? You may return to your normal, everyday life, including diet, activities, and medicines, unless your health care provider tells you not to do that. Follow these instructions at home: It is up to you to get the results of your test. Ask your health care provider, or the department that is doing the test, when your results will be ready. Keep all follow-up visits. This is important. Summary An echocardiogram is a test that uses sound waves (ultrasound) to produce images of the heart. Images from an echocardiogram can provide important information about the size and shape of your heart, heart muscle function, heart valve function, and other possible heart problems. You do not need to do anything to prepare before this test. You may eat and drink normally. After the echocardiogram is completed, you may return to your normal, everyday life, unless your health care provider tells you not to do that. This information is not intended to replace advice given to you by your health care provider. Make sure you discuss any questions you have with your health care provider. Document Revised: 05/11/2021 Document Reviewed: 04/20/2020 Elsevier Patient Education  2023 Elsevier Inc.   Atrial Flutter  Atrial flutter is a type of abnormal heart rhythm (arrhythmia). The heart has an electrical system that tells it how to beat. In atrial flutter, the signals move rapidly in the top chambers of the heart (the atria). This makes your heart beat very fast. Atrial flutter can come and go, or it can be permanent. The goal of treatment is to prevent blood clots from  forming, control your heart rate, or restore your heartbeat to a normal rhythm. If this condition is not treated, it can cause serious problems, such as a weakened heart muscle (cardiomyopathy) or a stroke. What are the causes? This condition is often caused by conditions that damage the heart's electrical system. These include: Heart conditions and heart surgery. These include heart attacks and open-heart surgery. Lung problems, such as COPD or a blood clot in the lung (  pulmonary embolism, or PE). Poorly controlled high blood pressure (hypertension). Overactive thyroid (hyperthyroidism). Diabetes. In some cases, the cause of this condition is not known. What increases the risk? You are more likely to develop this condition if: You are an elderly adult. You are a man. You are overweight (obese). You have obstructive sleep apnea. You have a family history of atrial flutter. You have diabetes. You drink a lot of alcohol, especially binge drinking. You use drugs, including cannabis. You smoke. What are the signs or symptoms? Symptoms of this condition include: A feeling that your heart is pounding or racing (palpitations). Shortness of breath. Chest pain. Feeling dizzy or light-headed. Fainting. Low blood pressure (hypotension). Fatigue. Tiring easily during exercise or activity. In some cases, there are no symptoms. How is this diagnosed? This condition may be diagnosed with: An electrocardiogram (ECG) to check electrical signals of the heart. An ambulatory cardiac monitor to record your heart's activity for a few days. An echocardiogram to create pictures of your heart. A transesophageal echocardiogram (TEE) to create even better pictures of your heart. A stress test to check your blood supply while you exercise. Imaging tests, such as a CT scan or chest X-ray. Blood tests. How is this treated? Treatment depends on underlying conditions and how you feel when you experience  atrial flutter. This condition may be treated with: Medicines to prevent blood clots or to treat heart rate or heart rhythm problems. Electrical cardioversion to reset the heart's rhythm. Ablation to remove the heart tissue that sends abnormal signals. Left atrial appendage closure to seal the area where blood clots can form. In some cases, underlying conditions will be treated. Follow these instructions at home: Medicines Take over-the-counter and prescription medicines only as told by your health care provider. Do not take any new medicines without talking to your health care provider. If you are taking blood thinners: Talk with your health care provider before you take any medicines that contain aspirin or NSAIDs, such as ibuprofen. These medicines increase your risk for dangerous bleeding. Take your medicine exactly as told, at the same time every day. Avoid activities that could cause injury or bruising, and follow instructions about how to prevent falls. Wear a medical alert bracelet or carry a card that lists what medicines you take. Lifestyle Eat heart-healthy foods. Talk with a dietitian to make an eating plan that is right for you. Do not use any products that contain nicotine or tobacco, such as cigarettes, e-cigarettes, and chewing tobacco. If you need help quitting, ask your health care provider. Do not drink alcohol. Do not use drugs, including cannabis. Lose weight if you are overweight or obese. Exercise regularly as instructed by your health care provider. General instructions Do not use diet pills unless your health care provider approves. Diet pills may make heart problems worse. If you have obstructive sleep apnea, manage your condition as told by your health care provider. Keep all follow-up visits as told by your health care provider. This is important. Contact a health care provider if you: Notice a change in the rate, rhythm, or strength of your heartbeat. Are  taking a blood thinner and you notice more bruising. Have a sudden change in weight. Tire more easily when you exercise or do heavy work. Get help right away if you have: Pain or pressure in your chest. Shortness of breath. Fainting. Increasing sweating with no known cause. Side effects of blood thinners, such as blood in your vomit, stool, or urine, or bleeding  that cannot stop. Any symptoms of a stroke. "BE FAST" is an easy way to remember the main warning signs of a stroke: B - Balance. Signs are dizziness, sudden trouble walking, or loss of balance. E - Eyes. Signs are trouble seeing or a sudden change in vision. F - Face. Signs are sudden weakness or numbness of the face, or the face or eyelid drooping on one side. A - Arms. Signs are weakness or numbness in an arm. This happens suddenly and usually on one side of the body. S - Speech. Signs are sudden trouble speaking, slurred speech, or trouble understanding what people say. T - Time. Time to call emergency services. Write down what time symptoms started. Other signs of a stroke, such as: A sudden, severe headache with no known cause. Nausea or vomiting. Seizure. These symptoms may represent a serious problem that is an emergency. Do not wait to see if the symptoms will go away. Get medical help right away. Call your local emergency services (911 in the [place] Do not drive yourself to the hospital. Summary Atrial flutter is an abnormal heart rhythm that can give you symptoms of palpitations, shortness of breath, or fatigue. Atrial flutter is often treated with medicines to keep your heart in a normal rhythm and to prevent a stroke. Get help right away if you cannot catch your breath, or have chest pain or pressure. Get help right away if you have signs or symptoms of a stroke. This information is not intended to replace advice given to you by your health care provider. Make sure you discuss any questions you have with your health  care provider. Document Revised: 02/19/2019 Document Reviewed: 02/19/2019 Elsevier Patient Education  2023 Elsevier Inc.   Important Information About Sugar

## 2022-04-05 ENCOUNTER — Ambulatory Visit (INDEPENDENT_AMBULATORY_CARE_PROVIDER_SITE_OTHER): Payer: BC Managed Care – PPO

## 2022-04-05 DIAGNOSIS — I4892 Unspecified atrial flutter: Secondary | ICD-10-CM | POA: Diagnosis not present

## 2022-04-06 LAB — ECHOCARDIOGRAM COMPLETE
AR max vel: 3.86 cm2
AV Area VTI: 4.79 cm2
AV Area mean vel: 4.52 cm2
AV Mean grad: 2 mmHg
AV Peak grad: 4.7 mmHg
Ao pk vel: 1.08 m/s
Area-P 1/2: 2.39 cm2
Calc EF: 64.1 %
S' Lateral: 2.7 cm
Single Plane A2C EF: 60.2 %
Single Plane A4C EF: 67.9 %

## 2022-05-02 ENCOUNTER — Encounter: Payer: Self-pay | Admitting: Cardiology

## 2022-05-02 ENCOUNTER — Ambulatory Visit: Payer: BC Managed Care – PPO | Admitting: Cardiology

## 2022-05-02 ENCOUNTER — Other Ambulatory Visit: Payer: Self-pay | Admitting: Family

## 2022-05-02 VITALS — BP 120/84 | HR 83 | Ht 73.0 in | Wt 259.0 lb

## 2022-05-02 DIAGNOSIS — I10 Essential (primary) hypertension: Secondary | ICD-10-CM | POA: Diagnosis not present

## 2022-05-02 DIAGNOSIS — I4892 Unspecified atrial flutter: Secondary | ICD-10-CM | POA: Diagnosis not present

## 2022-05-02 DIAGNOSIS — E7800 Pure hypercholesterolemia, unspecified: Secondary | ICD-10-CM

## 2022-05-02 DIAGNOSIS — E119 Type 2 diabetes mellitus without complications: Secondary | ICD-10-CM | POA: Diagnosis not present

## 2022-05-02 DIAGNOSIS — E785 Hyperlipidemia, unspecified: Secondary | ICD-10-CM | POA: Diagnosis not present

## 2022-05-02 NOTE — Progress Notes (Signed)
Cardiology Clinic Note   Patient Name: Dustin Fletcher Date of Encounter: 05/02/2022  Primary Care Provider:  [name], [name] Primary Cardiologist:  Kathlyn Sacramento, MD  Patient Profile    61 year old male with past medical history of diabetes, essential hypertension, hyperlipidemia, and paroxysmal atrial fibrillation, who is here today to follow-up on his PAF.  Past Medical History    Past Medical History:  Diagnosis Date   Bell's palsy    Diabetes mellitus without complication ([place])    Hyperlipidemia    Hypertension    Past Surgical History:  Procedure Laterality Date   HERNIA REPAIR      Allergies  No Known Allergies  History of Present Illness    61 year old male with past medical history of diabetes, essential hypertension, hyperlipidemia, paroxysmal atrial fibrillation who was originally referred to the office for the evaluation of dizziness and an abnormal EKG.  He had previously been evaluated by his PCP for essential hypertension which necessitated an increase dose of lisinopril from 10 to 20 mg.  Shortly after that he had a presyncopal episode and experienced hypotension.  He was noted to have slightly prolonged QRS and thus was sent for cardiac evaluation.  On arrival in the office EKG showed that he was in atrial flutter with variable ventricular conduction with a heart rate of 148 bpm.  He remained asymptomatic.  He was started on beta-blocker therapy and started on Eliquis 5 mg twice daily for CHA2DS2-VASc score of at least 2.  He did undergo echocardiogram which revealed LVEF of 60 to 65%, no regional wall motion abnormalities, grade 1 diastolic dysfunction with impaired relaxation, which no valvular abnormalities, borderline dilatation of the aortic root measuring 38 mm.  He returns to clinic today stating that he has been doing fairly well.  He denies any chest pain, shortness of breath, or palpitations.  He states that his wife has set of  parameters on his Apple Watch to alert if his heart rate is less than 40 or greater than 115.  He states they have noticed that his heart rate did drop into the 30s on 1 occasion but then quickly recovered.  He also stated that when he was walking for an extended period of time he did note that his heart rate had gone up to approximately 120 again he remained asymptomatic to changes in the heart rate and heart rate elevations and drop was not prolonged.  He denies any recent hospitalizations or visits to the emergency department.  Also notes that his brother has a longstanding history of atrial fibrillation.  Home Medications    Current Outpatient Medications  Medication Sig Dispense Refill   apixaban (ELIQUIS) 5 MG TABS tablet Take 1 tablet (5 mg total) by mouth 2 (two) times daily. 60 tablet 6   blood glucose meter kit and supplies KIT Dispense based on patient and insurance preference. Use up to four times daily as directed. (FOR ICD-9 250.00, 250.01). 1 each 0   Cholecalciferol (VITAMIN D3) 2000 UNITS TABS Take 4,000 Units by mouth 2 (two) times daily.     Dapagliflozin-metFORMIN HCl ER (XIGDUO XR) 01-999 MG TB24 Take 2 tablets by mouth every morning. 180 tablet 0   ELDERBERRY PO Take by mouth daily.     ibuprofen (ADVIL,MOTRIN) 800 MG tablet Take 800 mg by mouth 2 (two) times daily as needed.     metoprolol tartrate (LOPRESSOR) 25 MG tablet Take 1 tablet (25 mg) by mouth twice daily 60  tablet 6   Omega-3 Fatty Acids (FISH OIL) 1000 MG CAPS Take by mouth daily at 12 noon.     rosuvastatin (CRESTOR) 5 MG tablet TAKE ONE TABLET BY MOUTH DAILY 90 tablet 1   Semaglutide ([name]) 14 MG TABS Take 1 tablet (14 mg total) by mouth daily. 90 tablet 1   vitamin B-12 (CYANOCOBALAMIN) 1000 MCG tablet Take 1,000 mcg by mouth daily.     No current facility-administered medications for this visit.     Family History    Family History  Problem Relation Age of Onset   Cancer Mother    Cancer Father     Diabetes Father    Hypertension Father    Heart disease Brother    Hypertension Brother    He indicated that his mother is alive. He indicated that his father is deceased. He indicated that his brother is alive.  Social History    Social History   Socioeconomic History   Marital status: Married    Spouse name: Not on file   Number of children: Not on file   Years of education: Not on file   Highest education level: Not on file  Occupational History   Not on file  Tobacco Use   Smoking status: Former    Types: Cigarettes    Quit date: 05/31/1981    Years since quitting: 40.9   Smokeless tobacco: Never  Vaping Use   Vaping Use: Never used  Substance and Sexual Activity   Alcohol use: No    Alcohol/week: 0.0 standard drinks of alcohol   Drug use: No   Sexual activity: Not on file  Other Topics Concern   Not on file  Social History Narrative   Shawna Orleans - Tyler Deis      Social Determinants of Health   Financial Resource Strain: Low Risk  (12/16/2020)   Overall Financial Resource Strain (CARDIA)    Difficulty of Paying Living Expenses: Not hard at all  Recent Concern: Financial Resource Strain - Medium Risk (10/14/2020)   Overall Financial Resource Strain (CARDIA)    Difficulty of Paying Living Expenses: Somewhat hard  Food Insecurity: Not on file  Transportation Needs: Not on file  Physical Activity: Not on file  Stress: Not on file  Social Connections: Not on file  Intimate Partner Violence: Not on file     Review of Systems    General:  No chills, fever, night sweats or weight changes.  Cardiovascular:  No chest pain, dyspnea on exertion, edema, orthopnea, palpitations, paroxysmal nocturnal dyspnea. Dermatological: No rash, lesions/masses Respiratory: No cough, dyspnea Urologic: No hematuria, dysuria Abdominal:   No nausea, vomiting, diarrhea, bright red blood per rectum, melena, or hematemesis Neurologic:  No visual changes, wkns, changes in mental status. All  other systems reviewed and are otherwise negative except as noted above.     Physical Exam    VS:  BP 120/84 (BP Location: Left Arm, Patient Position: Sitting, Cuff Size: Large)   Pulse 83   Ht '6\' 1"'  (1.854 m)   Wt 259 lb (117.5 kg)   SpO2 98%   BMI 34.17 kg/m  , BMI Body mass index is 34.17 kg/m.     GEN: Well nourished, well developed, in no acute distress. HEENT: normal. Neck: Supple, no JVD, carotid bruits, or masses. Cardiac: RRR, no murmurs, rubs, or gallops. No clubbing, cyanosis, edema.  Radials/DP/PT 2+ and equal bilaterally.  Respiratory:  Respirations regular and unlabored, clear to auscultation bilaterally. GI: Soft, nontender, nondistended, BS +  x 4. MS: no deformity or atrophy. Skin: warm and dry, no rash. Neuro:  Strength and sensation are intact. Psych: Normal affect.  Accessory Clinical Findings    ECG personally reviewed by me today-sinus rhythm with a rate of 83- No acute changes  Lab Results  Component Value Date   WBC 6.3 09/13/2021   HGB 15.5 09/13/2021   HCT 44.5 09/13/2021   MCV 85 09/13/2021   PLT 241 09/13/2021   Lab Results  Component Value Date   CREATININE 0.90 02/23/2022   BUN 19 02/23/2022   NA 135 02/23/2022   K 4.6 02/23/2022   CL 102 02/23/2022   CO2 18 (L) 02/23/2022   Lab Results  Component Value Date   ALT 18 02/23/2022   AST 16 02/23/2022   GGT 31 01/08/2017   ALKPHOS 66 02/23/2022   BILITOT 0.5 02/23/2022   Lab Results  Component Value Date   CHOL 120 09/13/2021   HDL 34 (L) 09/13/2021   LDLCALC 57 09/13/2021   TRIG 171 (H) 09/13/2021   CHOLHDL 3.5 09/13/2021    Lab Results  Component Value Date   HGBA1C 6.6 (H) 02/23/2022    Assessment & Plan   1.  Paroxysmal atrial fibrillation/atrial flutter maintaining sinus rhythm today.  He remains asymptomatic and is unaware of when he was in A-fib or a flutter.  He has done well on metoprolol 25 mg twice daily.  He is also continued on Eliquis 5 mg twice daily for  CHA2DS2-VASc score of at least 2.  Has had no episodes of bleeding since starting anticoagulant.  Since he is maintaining sinus rhythm today there is no further need for proceeding with cardioversion procedure.    2.  Hypertension with a blood pressure of 120/84 today.  He is tolerating metoprolol and being off of lisinopril with better controlled blood pressures.  He will continue metoprolol 25 mg twice daily no other changes were made to his medication regimen today.  3.  Hyperlipidemia with recent LDL 57.  He is to continue on rosuvastatin 5 mg daily.  This is managed by his PCP  4.  Type 2 diabetes with his last hemoglobin A1c is 6.6 that was in 02/2022.  This continue to be managed by his PCP.  5.  Disposition patient return to clinic to see MD/APP in 3 months with an EKG on return or sooner if needed.  Oyindamola Key, NP 05/02/2022, 3:35 PM

## 2022-05-02 NOTE — Patient Instructions (Signed)
Medication Instructions:   Your physician recommends that you continue on your current medications as directed. Please refer to the Current Medication list given to you today.  *If you need a refill on your cardiac medications before your next appointment, please call your pharmacy*   Lab Work:  None ordered  Testing/Procedures:  None ordered   Follow-Up: At [facility], you and your health needs are our priority.  As part of our continuing mission to provide you with exceptional heart care, we have created designated Provider Care Teams.  These Care Teams include your primary Cardiologist (physician) and Advanced Practice Providers (APPs -  Physician Assistants and Nurse Practitioners) who all work together to provide you with the care you need, when you need it.  We recommend signing up for the patient portal called "MyChart".  Sign up information is provided on this After Visit Summary.  MyChart is used to connect with patients for Virtual Visits (Telemedicine).  Patients are able to view lab/test results, encounter notes, upcoming appointments, etc.  Non-urgent messages can be sent to your provider as well.   To learn more about what you can do with MyChart, go to [email or web address].    Your next appointment:   3 month(s) w/ EKG  The format for your next appointment:   In Person  Provider:   You may see Dr. Lorine Bears or one of the following Advanced Practice Providers on your designated Care Team:   Nicolasa Ducking, NP Eula Listen, PA-C Cadence Fransico Cesare, [place]     Important Information About Sugar

## 2022-05-16 ENCOUNTER — Other Ambulatory Visit: Payer: Self-pay | Admitting: Family

## 2022-05-16 DIAGNOSIS — E119 Type 2 diabetes mellitus without complications: Secondary | ICD-10-CM

## 2022-05-24 ENCOUNTER — Encounter: Payer: Self-pay | Admitting: Family

## 2022-05-24 ENCOUNTER — Ambulatory Visit: Payer: BC Managed Care – PPO | Admitting: Family

## 2022-05-24 VITALS — BP 128/84 | HR 88 | Temp 97.7°F | Ht 74.0 in | Wt 260.2 lb

## 2022-05-24 DIAGNOSIS — E118 Type 2 diabetes mellitus with unspecified complications: Secondary | ICD-10-CM | POA: Diagnosis not present

## 2022-05-24 DIAGNOSIS — I4892 Unspecified atrial flutter: Secondary | ICD-10-CM

## 2022-05-24 NOTE — Progress Notes (Signed)
Subjective:    Patient ID: Dustin Fletcher, male    DOB: [birth year], 61 y.o.   MRN: [id number]  CC: Dustin Fletcher is a 61 y.o. male who presents today for follow up.   HPI: Feels well today  No new complaints   DM- compliant with dapagliflozin - metformin 5-1061m two tablets QD and semaglutide 14 mg qd    04/04/22 , consult with Dustin Fletcher.  Atrial flutter with rapid ventricular response.  Opted to discontinue lisinopril and metoprolol tartrate 25 mg twice daily, Eliquis 5 mg twice daily  HISTORY:  Past Medical History:  Diagnosis Date   Bell's palsy    Diabetes mellitus without complication ([place]    Hyperlipidemia    Hypertension    Past Surgical History:  Procedure Laterality Date   HERNIA REPAIR     Family History  Problem Relation Age of Onset   Cancer Mother    Cancer Father    Diabetes Father    Hypertension Father    Heart disease Brother    Hypertension Brother     Allergies: Patient has no known allergies. Current Outpatient Medications on File Prior to Visit  Medication Sig Dispense Refill   apixaban (ELIQUIS) 5 MG TABS tablet Take 1 tablet (5 mg total) by mouth 2 (two) times daily. 60 tablet 6   blood glucose meter kit and supplies KIT Dispense based on patient and insurance preference. Use up to four times daily as directed. (FOR ICD-9 250.00, 250.01). 1 each 0   Cholecalciferol (VITAMIN D3) 2000 UNITS TABS Take 4,000 Units by mouth 2 (two) times daily.     ELDERBERRY PO Take by mouth daily.     ibuprofen (ADVIL,MOTRIN) 800 MG tablet Take 800 mg by mouth 2 (two) times daily as needed.     metoprolol tartrate (LOPRESSOR) 25 MG tablet Take 1 tablet (25 mg) by mouth twice daily 60 tablet 6   Omega-3 Fatty Acids (FISH OIL) 1000 MG CAPS Take by mouth daily at 12 noon.     rosuvastatin (CRESTOR) 5 MG tablet TAKE ONE TABLET BY MOUTH DAILY 90 tablet 1   Semaglutide ([name]) 14 MG TABS Take 1 tablet (14 mg total) by mouth daily. 90 tablet 1    vitamin B-12 (CYANOCOBALAMIN) 1000 MCG tablet Take 1,000 mcg by mouth daily.     XIGDUO XR 01-999 MG TB24 TAKE 2 TABLETS BY MOUTH EVERY MORNING 180 tablet 0   No current facility-administered medications on file prior to visit.    Social History   Tobacco Use   Smoking status: Former    Types: Cigarettes    Quit date: 05/31/1981    Years since quitting: 41.0   Smokeless tobacco: Never  Vaping Use   Vaping Use: Never used  Substance Use Topics   Alcohol use: No    Alcohol/week: 0.0 standard drinks of alcohol   Drug use: No    Review of Systems  Constitutional:  Negative for chills and fever.  Respiratory:  Negative for cough.   Cardiovascular:  Negative for chest pain and palpitations.  Gastrointestinal:  Negative for nausea and vomiting.      Objective:    BP 128/84 (BP Location: Left Arm, Patient Position: Sitting, Cuff Size: Normal)   Pulse 88   Temp 97.7 F (36.5 C) (Oral)   Ht '6\' 2"'  (1.88 m)   Wt 260 lb 3.2 oz (118 kg)   SpO2 96%   BMI 33.41 kg/m  BP Readings from Last 3  Encounters:  05/24/22 128/84  05/02/22 120/84  04/04/22 115/70   Wt Readings from Last 3 Encounters:  05/24/22 260 lb 3.2 oz (118 kg)  05/02/22 259 lb (117.5 kg)  04/04/22 261 lb 8 oz (118.6 kg)    Physical Exam Vitals reviewed.  Constitutional:      Appearance: He is well-developed.  Cardiovascular:     Rate and Rhythm: Regular rhythm.     Heart sounds: Normal heart sounds.  Pulmonary:     Effort: Pulmonary effort is normal. No respiratory distress.     Breath sounds: Normal breath sounds. No wheezing, rhonchi or rales.  Skin:    General: Skin is warm and dry.  Neurological:     Mental Status: He is alert.  Psychiatric:        Speech: Speech normal.        Behavior: Behavior normal.        Assessment & Plan:   Problem List Items Addressed This Visit       Cardiovascular and Mediastinum   Atrial flutter [facility])    Reviewed cardiology's note.  Chronic, stable.  Continue  metoprolol tartrate 25 mg bid, Eliquis 5 mg twice daily        Endocrine   DM type 2, controlled, with complication ([place]) - Primary    Dissipate well controlled.  Pending A1c.  Continue dapagliflozin - metformin 5-1031m two tablets QD , semaglutide 14 mg qd .  As he is no longer on lisinopril, will continue to monitor urine microalbumin      Relevant Orders   Microalbumin / creatinine urine ratio   Hemoglobin A1c     I am having Dustin Fletcher maintain his Vitamin D3, cyanocobalamin, Fish Oil, ibuprofen, ELDERBERRY PO, blood glucose meter kit and supplies, [name], metoprolol tartrate, apixaban, rosuvastatin, and Xigduo XR.   No orders of the defined types were placed in this encounter.   Return precautions given.   Risks, benefits, and alternatives of the medications and treatment plan prescribed today were discussed, and patient expressed understanding.   Education regarding symptom management and diagnosis given to patient on AVS.  Continue to follow with ABurnard Hawthorne Fletcher for routine health maintenance.   MDonia Poundsand I agreed with plan.   MMable Paris Fletcher

## 2022-05-24 NOTE — Assessment & Plan Note (Addendum)
Reviewed cardiology's note.  Chronic, stable.  Continue metoprolol tartrate 25 mg bid, Eliquis 5 mg twice daily

## 2022-05-24 NOTE — Assessment & Plan Note (Signed)
Dissipate well controlled.  Pending A1c.  Continue dapagliflozin - metformin 5-1000mg  two tablets QD , semaglutide 14 mg qd .  As he is no longer on lisinopril, will continue to monitor urine microalbumin

## 2022-05-26 ENCOUNTER — Other Ambulatory Visit: Payer: BC Managed Care – PPO

## 2022-05-26 DIAGNOSIS — E118 Type 2 diabetes mellitus with unspecified complications: Secondary | ICD-10-CM

## 2022-05-27 LAB — HGB A1C W/O EAG: Hgb A1c MFr Bld: 6.6 % — ABNORMAL HIGH (ref 4.8–5.6)

## 2022-05-28 LAB — MICROALBUMIN / CREATININE URINE RATIO
Creatinine, Urine: 108 mg/dL
Microalb/Creat Ratio: 11 mg/g creat (ref 0–29)
Microalbumin, Urine: 11.7 ug/mL

## 2022-07-24 ENCOUNTER — Encounter: Payer: Self-pay | Admitting: Physician Assistant

## 2022-07-24 ENCOUNTER — Ambulatory Visit (INDEPENDENT_AMBULATORY_CARE_PROVIDER_SITE_OTHER): Payer: Self-pay | Admitting: Physician Assistant

## 2022-07-24 VITALS — BP 138/80 | HR 89 | Temp 98.4°F | Ht 74.0 in | Wt 265.0 lb

## 2022-07-24 DIAGNOSIS — G5792 Unspecified mononeuropathy of left lower limb: Secondary | ICD-10-CM

## 2022-07-24 DIAGNOSIS — R202 Paresthesia of skin: Secondary | ICD-10-CM

## 2022-07-24 DIAGNOSIS — M79609 Pain in unspecified limb: Secondary | ICD-10-CM

## 2022-07-24 MED ORDER — GABAPENTIN 100 MG PO CAPS: 100.0000 mg | ORAL_CAPSULE | Freq: Three times a day (TID) | ORAL | 0 refills | Status: DC

## 2022-07-24 NOTE — Progress Notes (Signed)
[facility] [address], [place] [address]   Office Visit Note  Patient Name: Dustin Fletcher Date of Birth [id number]  Medical Record number [id number]  Date of Service: 07/24/2022  Chief Complaint  Patient presents with   Leg Pain    Left Leg pain, throbbing and sharpe pain and some numbness that started Thur. Pain is upper and lower. Hurts to walk and stand. Took OTC meds and deep blue. Ball of foot was hurting Friday night.      61 y/o M presents to the clinic for c/o left leg "numbness" and "tingling" sensation in the back of left thigh and lateral aspect of the lower leg/ankle area x 4 days. No recent fall or direct trauma to the lower back. No changes in bowel or urinary symptoms. +h/o diabetes, but well controlled. His last A1C level is 6.6 2 months ago.   Leg Pain       Current Medication:  Outpatient Encounter Medications as of 07/24/2022  Medication Sig   apixaban (ELIQUIS) 5 MG TABS tablet Take 1 tablet (5 mg total) by mouth 2 (two) times daily.   blood glucose meter kit and supplies KIT Dispense based on patient and insurance preference. Use up to four times daily as directed. (FOR ICD-9 250.00, 250.01).   Cholecalciferol (VITAMIN D3) 2000 UNITS TABS Take 4,000 Units by mouth 2 (two) times daily.   ELDERBERRY PO Take by mouth daily.   gabapentin (NEURONTIN) 100 MG capsule Take 1 capsule (100 mg total) by mouth 3 (three) times daily.   metoprolol tartrate (LOPRESSOR) 25 MG tablet Take 1 tablet (25 mg) by mouth twice daily   Omega-3 Fatty Acids (FISH OIL) 1000 MG CAPS Take by mouth daily at 12 noon.   rosuvastatin (CRESTOR) 5 MG tablet TAKE ONE TABLET BY MOUTH DAILY   Semaglutide ([name]) 14 MG TABS Take 1 tablet (14 mg total) by mouth daily.   vitamin B-12 (CYANOCOBALAMIN) 1000 MCG tablet Take 1,000 mcg by mouth daily.   XIGDUO XR 01-999 MG TB24 TAKE 2 TABLETS BY MOUTH EVERY MORNING   ibuprofen (ADVIL,MOTRIN) 800 MG tablet Take 800 mg by mouth  2 (two) times daily as needed. (Patient not taking: Reported on 07/24/2022)   No facility-administered encounter medications on file as of 07/24/2022.      Medical History: Past Medical History:  Diagnosis Date   Bell's palsy    Diabetes mellitus without complication ([facility])    Hyperlipidemia    Hypertension      Vital Signs: BP 138/80 (BP Location: Left Arm, Patient Position: Sitting, Cuff Size: Large)   Pulse 89   Temp 98.4 F (36.9 C) (Tympanic)   Ht _0  (1.88 m)   Wt 265 lb (120.2 kg)   SpO2 98%   BMI 34.02 kg/m    Review of Systems  Constitutional: Negative.   Musculoskeletal:  Positive for gait problem. Negative for myalgias, neck pain and neck stiffness.       Numbness and tingling of left leg  Skin: Negative.     Physical Exam Constitutional:      Appearance: Normal appearance.  HENT:     Right Ear: External ear normal.     Left Ear: External ear normal.  Eyes:     Extraocular Movements: Extraocular movements intact.  Musculoskeletal:     Cervical back: Normal range of motion.     Comments: Lumbar Spine: Normal leg extension. Able to stand on toes, standing on heels is difficult  on the left foot. R foot normal.  Sensation is grossly normal.   Skin:    General: Skin is warm.  Neurological:     General: No focal deficit present.     Mental Status: He is alert and oriented to person, place, and time.     Sensory: No sensory deficit.     Motor: No weakness.     Gait: Gait normal.  Psychiatric:        Mood and Affect: Mood normal.        Behavior: Behavior normal.       Assessment/Plan:  1. Neuropathy of left lower extremity Diabetic vs vitamin deficienty vs thyroid disorder vs spinal cord syndromes  -  gabapentin (NEURONTIN) 100 MG capsule         Take 1 capsule (100 mg total) by mouth 3 (three) times daily., Starting Mon 07/24/2022, Normal    2. Paresthesia and pain of left extremity  Discussed with patient regarding clinical findings.   Recommend follow up with Neurologist for further evaluation regarding his parestheisa of left lower extremity.  Will initiate low dose gabapentin if patient is able to tolerate it then will increase dose.  Briefly discussed f/u with Neuro or ortho spine to r/o any underlying conditions causation. Patient states he has an appointment soon with his PCP after returning back from vacation and will discuss options.   General Counseling: demaris leavell understanding of the findings of todays visit and agrees with plan of treatment. I have discussed any further diagnostic evaluation that may be needed or ordered today. We also reviewed his medications today. he has been encouraged to call the office with any questions or concerns that should arise related to todays visit.    Time spent:25 [place], [place] Physician Assistant

## 2022-08-08 ENCOUNTER — Encounter: Payer: Self-pay | Admitting: Cardiology

## 2022-08-08 ENCOUNTER — Ambulatory Visit: Payer: BC Managed Care – PPO | Attending: Cardiology | Admitting: Cardiology

## 2022-08-08 VITALS — BP 145/89 | HR 77 | Ht 74.0 in | Wt 267.2 lb

## 2022-08-08 DIAGNOSIS — R0989 Other specified symptoms and signs involving the circulatory and respiratory systems: Secondary | ICD-10-CM

## 2022-08-08 DIAGNOSIS — I4892 Unspecified atrial flutter: Secondary | ICD-10-CM | POA: Diagnosis not present

## 2022-08-08 DIAGNOSIS — M79605 Pain in left leg: Secondary | ICD-10-CM | POA: Diagnosis not present

## 2022-08-08 DIAGNOSIS — I10 Essential (primary) hypertension: Secondary | ICD-10-CM

## 2022-08-08 DIAGNOSIS — E785 Hyperlipidemia, unspecified: Secondary | ICD-10-CM

## 2022-08-08 DIAGNOSIS — E119 Type 2 diabetes mellitus without complications: Secondary | ICD-10-CM

## 2022-08-08 NOTE — Patient Instructions (Signed)
Medication Instructions:  Your Physician recommend you continue on your current medication as directed.    *If you need a refill on your cardiac medications before your next appointment, please call your pharmacy*   Lab Work: None ordered today   Testing/Procedures: Your physician has requested that you have an ankle brachial index (ABI). During this test an ultrasound and blood pressure cuff are used to evaluate the arteries that supply the arms and legs with blood.  Allow thirty minutes for this exam.  There are no restrictions or special instructions.  This will take place at [address] [address], [place] [address]    Follow-Up: At [facility], you and your health needs are our priority.  As part of our continuing mission to provide you with exceptional heart care, we have created designated Provider Care Teams.  These Care Teams include your primary Cardiologist (physician) and Advanced Practice Providers (APPs -  Physician Assistants and Nurse Practitioners) who all work together to provide you with the care you need, when you need it.  We recommend signing up for the patient portal called "MyChart".  Sign up information is provided on this After Visit Summary.  MyChart is used to connect with patients for Virtual Visits (Telemedicine).  Patients are able to view lab/test results, encounter notes, upcoming appointments, etc.  Non-urgent messages can be sent to your provider as well.   To learn more about what you can do with MyChart, go to [email or web address].    Your next appointment:   After test  The format for your next appointment:   In Person  Provider:   Charlsie Quest, NP

## 2022-08-08 NOTE — Progress Notes (Signed)
Cardiology Clinic Note   Patient Name: Dustin Fletcher Date of Encounter: 08/08/2022  Primary Care Provider:  [name], [name] Primary Cardiologist:  Kathlyn Sacramento, MD  Patient Profile    61 year old male with past medical history of diabetes, some hypertension, hyperlipidemia, paroxysmal atrial fibrillation, who is here today to follow-up on his PAF.  Past Medical History    Past Medical History:  Diagnosis Date   Bell's palsy    Diabetes mellitus without complication ([place])    Hyperlipidemia    Hypertension    Past Surgical History:  Procedure Laterality Date   HERNIA REPAIR      Allergies  No Known Allergies  History of Present Illness    Dustin Fletcher is a 61 year old male with a previous mentioned past medical history of type 2 diabetes, sent hypertension, hyperlipidemia, paroxysmal atrial fibrillation who is on chronic anticoagulation with apixaban.  He had previously been evaluated for presyncopal episode with hypotension after an increase in his lisinopril.  On arrival EKG showed that he was in atrial flutter with variable ventricular conduction with a heart rate of 148 bpm.  Although he remained asymptomatic he was started on beta-blocker therapy and started on apixaban 5 mg twice daily for CHA2DS2-VASc score of at least 2.  Echocardiogram revealed LVEF of 60 to 65%, no regional wall motion abnormalities, G1 DD, and no valvular abnormalities.  He was last seen in clinic 05/02/2022 where he was doing fairly well.  Denied any chest pain, shortness of breath, or palpitations.  He said his Apple Watch did alert him if he had a heart rate less than 40 or greater than 115.  He states that he had noticed his heart rate did drop to the 30s on 1 occasion but then quickly recovered.  No medication changes were made or further testing was ordered.  He returns to clinic today stating that he is just been doing okay.  He recently followed up with staff wellness  with some complaints that he had to his left leg it was a throbbing and sharp pain and some numbness that started on Thursday as well as the ball of his foot.  He was advised that he had sciatica.  He denies any chest pain, shortness of breath, or palpitations.  He has not noted any drops in heart rate as of late.  He has been continued on apixaban without any issues of bleeding.  Denies any recent hospitalizations or emergency department visits.  Home Medications    Current Outpatient Medications  Medication Sig Dispense Refill   apixaban (ELIQUIS) 5 MG TABS tablet Take 1 tablet (5 mg total) by mouth 2 (two) times daily. 60 tablet 6   blood glucose meter kit and supplies KIT Dispense based on patient and insurance preference. Use up to four times daily as directed. (FOR ICD-9 250.00, 250.01). 1 each 0   Cholecalciferol (VITAMIN D3) 2000 UNITS TABS Take 4,000 Units by mouth 2 (two) times daily.     ELDERBERRY PO Take by mouth daily.     gabapentin (NEURONTIN) 100 MG capsule Take 1 capsule (100 mg total) by mouth 3 (three) times daily. 90 capsule 0   ibuprofen (ADVIL,MOTRIN) 800 MG tablet Take 800 mg by mouth 2 (two) times daily as needed.     metoprolol tartrate (LOPRESSOR) 25 MG tablet Take 1 tablet (25 mg) by mouth twice daily 60 tablet 6   Omega-3 Fatty Acids (FISH OIL) 1000 MG CAPS Take by mouth  daily at 12 noon.     rosuvastatin (CRESTOR) 5 MG tablet TAKE ONE TABLET BY MOUTH DAILY 90 tablet 1   Semaglutide ([name]) 14 MG TABS Take 1 tablet (14 mg total) by mouth daily. 90 tablet 1   vitamin B-12 (CYANOCOBALAMIN) 1000 MCG tablet Take 1,000 mcg by mouth daily.     XIGDUO XR 01-999 MG TB24 TAKE 2 TABLETS BY MOUTH EVERY MORNING 180 tablet 0   No current facility-administered medications for this visit.     Family History    Family History  Problem Relation Age of Onset   Cancer Mother    Cancer Father    Diabetes Father    Hypertension Father    Heart disease Brother    Hypertension  Brother    He indicated that his mother is alive. He indicated that his father is deceased. He indicated that his brother is alive.  Social History    Social History   Socioeconomic History   Marital status: Married    Spouse name: Not on file   Number of children: Not on file   Years of education: Not on file   Highest education level: Not on file  Occupational History   Not on file  Tobacco Use   Smoking status: Former    Types: Cigarettes    Quit date: 05/31/1981    Years since quitting: 41.2   Smokeless tobacco: Never  Vaping Use   Vaping Use: Never used  Substance and Sexual Activity   Alcohol use: No    Alcohol/week: 0.0 standard drinks of alcohol   Drug use: No   Sexual activity: Not on file  Other Topics Concern   Not on file  Social History Narrative   Shawna Orleans - Tyler Deis      Social Determinants of Health   Financial Resource Strain: Low Risk  (12/16/2020)   Overall Financial Resource Strain (CARDIA)    Difficulty of Paying Living Expenses: Not hard at all  Recent Concern: Financial Resource Strain - Medium Risk (10/14/2020)   Overall Financial Resource Strain (CARDIA)    Difficulty of Paying Living Expenses: Somewhat hard  Food Insecurity: Not on file  Transportation Needs: Not on file  Physical Activity: Not on file  Stress: Not on file  Social Connections: Not on file  Intimate Partner Violence: Not on file     Review of Systems    General:  No chills, fever, night sweats or weight changes.  Endorses overall being cold Cardiovascular:  No chest pain, dyspnea on exertion, edema, orthopnea, palpitations, paroxysmal nocturnal dyspnea. Dermatological: No rash, lesions/masses Respiratory: No cough, dyspnea Musculoskeletal: Endorses left leg pain that is worse with standing Urologic: No hematuria, dysuria Abdominal:   No nausea, vomiting, diarrhea, bright red blood per rectum, melena, or hematemesis Neurologic:  No visual changes, wkns, changes in mental  status. All other systems reviewed and are otherwise negative except as noted above.   Physical Exam    VS:  BP (!) 145/89 (BP Location: Left Arm, Patient Position: Sitting, Cuff Size: Large)   Pulse 77   Ht _0  (1.88 m)   Wt 267 lb 3.2 oz (121.2 kg)   SpO2 99%   BMI 34.31 kg/m  , BMI Body mass index is 34.31 kg/m.     GEN: Well nourished, well developed, in no acute distress. HEENT: normal. Neck: Supple, no JVD, carotid bruits, or masses. Cardiac: RRR, no murmurs, rubs, or gallops. No clubbing, cyanosis, edema.  Radials 2+/DP/PT 1+ on the  right and found via  doppler on left Respiratory:  Respirations regular and unlabored, clear to auscultation bilaterally. GI: Soft, nontender, nondistended, BS + x 4. MS: no deformity or atrophy. Skin: warm and dry, no rash. Neuro:  Strength and sensation are intact. Psych: Normal affect.  Accessory Clinical Findings    ECG personally reviewed by me today-sinus rhythm with a rate of 77 with artifact- No acute changes  Lab Results  Component Value Date   WBC 6.3 09/13/2021   HGB 15.5 09/13/2021   HCT 44.5 09/13/2021   MCV 85 09/13/2021   PLT 241 09/13/2021   Lab Results  Component Value Date   CREATININE 0.90 02/23/2022   BUN 19 02/23/2022   NA 135 02/23/2022   K 4.6 02/23/2022   CL 102 02/23/2022   CO2 18 (L) 02/23/2022   Lab Results  Component Value Date   ALT 18 02/23/2022   AST 16 02/23/2022   GGT 31 01/08/2017   ALKPHOS 66 02/23/2022   BILITOT 0.5 02/23/2022   Lab Results  Component Value Date   CHOL 120 09/13/2021   HDL 34 (L) 09/13/2021   LDLCALC 57 09/13/2021   TRIG 171 (H) 09/13/2021   CHOLHDL 3.5 09/13/2021    Lab Results  Component Value Date   HGBA1C 6.6 (H) 05/26/2022    Assessment & Plan   1.  Left leg pain with discolored station and diminished pulses.  Pulses dopplered with the leg cooler to touch.  Pain when standing but not during ambulation.  Previously seen by staff at wellness center and was  advised that he had sciatica and may need to follow-up with neuro or Ortho.  He has been scheduled for ABIs.  2.  Paroxysmal atrial fibrillation/atrial flutter maintaining sinus rhythm today.  He remains asymptomatic.  He is continued on metoprolol tartrate 25 mg twice daily and apixaban 5 mg twice daily for CHA2DS2-VASc score of at least 2.  He has had no episodes of bleeding since taking anticoagulants.  3.  Hypertension blood pressure today 145/89 he does have complaint of leg pain so slightly elevated pressure likely pain induced he is previously prescribed gabapentin for his left leg pain.  He is continued on metoprolol tartrate 25 mg twice daily we will continue to monitor and make changes to medications as needed.  4.  Hyperlipidemia with recent LDL 57.  He is continued on rosuvastatin 5 mg daily and fish oil.  This continues to be managed by his PCP.  5.  Type 2 diabetes with A1c of 6.6 05/26/2022.  He has been continued on his diabetic medications and is continued to be managed by his PCP.  6.  Disposition patient return to clinic to see MD/APP after ABIs are completed.  Aneliese Beaudry, NP 08/08/2022, 4:38 PM

## 2022-08-09 ENCOUNTER — Other Ambulatory Visit: Payer: Self-pay | Admitting: Cardiology

## 2022-08-09 DIAGNOSIS — R0989 Other specified symptoms and signs involving the circulatory and respiratory systems: Secondary | ICD-10-CM

## 2022-08-09 DIAGNOSIS — M79605 Pain in left leg: Secondary | ICD-10-CM

## 2022-08-14 ENCOUNTER — Ambulatory Visit: Payer: BC Managed Care – PPO | Attending: Cardiology

## 2022-08-14 DIAGNOSIS — M79605 Pain in left leg: Secondary | ICD-10-CM

## 2022-08-14 DIAGNOSIS — R0989 Other specified symptoms and signs involving the circulatory and respiratory systems: Secondary | ICD-10-CM

## 2022-08-17 ENCOUNTER — Other Ambulatory Visit: Payer: Self-pay | Admitting: Family

## 2022-08-17 DIAGNOSIS — E118 Type 2 diabetes mellitus with unspecified complications: Secondary | ICD-10-CM

## 2022-08-22 ENCOUNTER — Encounter: Payer: Self-pay | Admitting: Cardiology

## 2022-08-22 ENCOUNTER — Other Ambulatory Visit: Payer: Self-pay | Admitting: Family

## 2022-08-22 ENCOUNTER — Ambulatory Visit: Payer: BC Managed Care – PPO | Attending: Cardiology | Admitting: Cardiology

## 2022-08-22 VITALS — BP 120/82 | HR 82 | Ht 74.0 in | Wt 261.6 lb

## 2022-08-22 DIAGNOSIS — I4892 Unspecified atrial flutter: Secondary | ICD-10-CM

## 2022-08-22 DIAGNOSIS — E785 Hyperlipidemia, unspecified: Secondary | ICD-10-CM | POA: Diagnosis not present

## 2022-08-22 DIAGNOSIS — I10 Essential (primary) hypertension: Secondary | ICD-10-CM

## 2022-08-22 DIAGNOSIS — E119 Type 2 diabetes mellitus without complications: Secondary | ICD-10-CM

## 2022-08-22 DIAGNOSIS — M79605 Pain in left leg: Secondary | ICD-10-CM | POA: Diagnosis not present

## 2022-08-22 NOTE — Patient Instructions (Signed)
Medication Instructions:   Your physician recommends that you continue on your current medications as directed. Please refer to the Current Medication list given to you today.  *If you need a refill on your cardiac medications before your next appointment, please call your pharmacy*   Lab Work:  None Ordered   If you have labs (blood work) drawn today and your tests are completely normal, you will receive your results only by: MyChart Message (if you have MyChart) OR A paper copy in the mail If you have any lab test that is abnormal or we need to change your treatment, we will call you to review the results.   Testing/Procedures:  None Ordered   Follow-Up: At [facility], you and your health needs are our priority.  As part of our continuing mission to provide you with exceptional heart care, we have created designated Provider Care Teams.  These Care Teams include your primary Cardiologist (physician) and Advanced Practice Providers (APPs -  Physician Assistants and Nurse Practitioners) who all work together to provide you with the care you need, when you need it.  We recommend signing up for the patient portal called "MyChart".  Sign up information is provided on this After Visit Summary.  MyChart is used to connect with patients for Virtual Visits (Telemedicine).  Patients are able to view lab/test results, encounter notes, upcoming appointments, etc.  Non-urgent messages can be sent to your provider as well.   To learn more about what you can do with MyChart, go to [email or web address].    Your next appointment:   6 month(s)  The format for your next appointment:   In Person  Provider:   You may see Muhammad Arida, MD or one of the following Advanced Practice Providers on your designated Care Team:   Christopher Berge, NP Ryan Dunn, PA-C Cadence Furth, PA-C Sheri Hammock, NP  

## 2022-08-22 NOTE — Progress Notes (Signed)
Cardiology Clinic Note   Patient Name: Dustin Fletcher Date of Encounter: 08/22/2022  Primary Care Provider:  [name], [name] Primary Cardiologist:  Kathlyn Sacramento, MD  Patient Profile    61 year old male with a past medical history of diabetes, hypertension, hyperlipidemia, paroxysmal atrial fibrillation, who is here today for follow-up.  Past Medical History    Past Medical History:  Diagnosis Date   Bell's palsy    Diabetes mellitus without complication ([place])    Hyperlipidemia    Hypertension    Past Surgical History:  Procedure Laterality Date   HERNIA REPAIR      Allergies  No Known Allergies  History of Present Illness    Dustin Fletcher is a 61 year old male with previously mentioned past medical history of type 2 diabetes, essential hypertension, hyperlipidemia, paroxysmal atrial fibrillation who is on chronic anticoagulation with apixaban.  He had previously been evaluated for presyncopal episode with hypotension after an increase in his lisinopril.  He remained asymptomatic and was started on beta-blocker therapy and started on apixaban twice daily for CHA2DS2-VASc morbidly is 2.  Echocardiogram revealed LVEF of 60-65%, no regional wall motion abnormalities, G1 DD, and no valvular abnormalities.  He was last seen in clinic 08/08/2022 with some complaints of left leg pain that was throbbing and sharp and numbness that started on Thursday at the ball of his foot.  To been advised previously he had had sciatica.  At that time he was scheduled for ABIs.  ABIs were completed and resulted as normal.  He returns to clinic today stating that he has been doing well for the cardiac standpoint. He continues to have discomfort with tingling, numbness, and pain to his left lower leg from the calf down into his foot. It is slowly starting to improve. He denies chest pain, shortness of breath, palpitations, or dizziness. No visits to the emergency department  or recent hospitalizations. Currently he is enjoying his time off from work, he continues to work at [employer].   Home Medications    Current Outpatient Medications  Medication Sig Dispense Refill   apixaban (ELIQUIS) 5 MG TABS tablet Take 1 tablet (5 mg total) by mouth 2 (two) times daily. 60 tablet 6   blood glucose meter kit and supplies KIT Dispense based on patient and insurance preference. Use up to four times daily as directed. (FOR ICD-9 250.00, 250.01). 1 each 0   Cholecalciferol (VITAMIN D3) 2000 UNITS TABS Take 4,000 Units by mouth 2 (two) times daily.     ELDERBERRY PO Take 1 tablet by mouth daily.     gabapentin (NEURONTIN) 100 MG capsule Take 1 capsule (100 mg total) by mouth 3 (three) times daily. 90 capsule 0   ibuprofen (ADVIL,MOTRIN) 800 MG tablet Take 800 mg by mouth 2 (two) times daily as needed for fever, headache or mild pain.     metoprolol tartrate (LOPRESSOR) 25 MG tablet Take 1 tablet (25 mg) by mouth twice daily 60 tablet 6   Omega-3 Fatty Acids (FISH OIL) 1000 MG CAPS Take 2,000 mg by mouth every morning. And takes 100 mg in the evening     rosuvastatin (CRESTOR) 5 MG tablet TAKE ONE TABLET BY MOUTH DAILY 90 tablet 1   [name] 14 MG TABS TAKE 1 TABLET BY MOUTH DAILY 90 tablet 1   vitamin B-12 (CYANOCOBALAMIN) 1000 MCG tablet Take 1,000 mcg by mouth daily.     XIGDUO XR 01-999 MG TB24 TAKE 2 TABLETS BY MOUTH  EVERY MORNING 180 tablet 0   No current facility-administered medications for this visit.     Family History    Family History  Problem Relation Age of Onset   Cancer Mother    Cancer Father    Diabetes Father    Hypertension Father    Heart disease Brother    Hypertension Brother    He indicated that his mother is alive. He indicated that his father is deceased. He indicated that his brother is alive.  Social History    Social History   Socioeconomic History   Marital status: Married    Spouse name: Not on file   Number of children:  Not on file   Years of education: Not on file   Highest education level: Not on file  Occupational History   Not on file  Tobacco Use   Smoking status: Former    Types: Cigarettes    Quit date: 05/31/1981    Years since quitting: 41.2   Smokeless tobacco: Never  Vaping Use   Vaping Use: Never used  Substance and Sexual Activity   Alcohol use: No    Alcohol/week: 0.0 standard drinks of alcohol   Drug use: No   Sexual activity: Not on file  Other Topics Concern   Not on file  Social History Narrative   Shawna Orleans - Tyler Deis      Social Determinants of Health   Financial Resource Strain: Low Risk  (12/16/2020)   Overall Financial Resource Strain (CARDIA)    Difficulty of Paying Living Expenses: Not hard at all  Recent Concern: Financial Resource Strain - Medium Risk (10/14/2020)   Overall Financial Resource Strain (CARDIA)    Difficulty of Paying Living Expenses: Somewhat hard  Food Insecurity: Not on file  Transportation Needs: Not on file  Physical Activity: Not on file  Stress: Not on file  Social Connections: Not on file  Intimate Partner Violence: Not on file     Review of Systems    General:  No chills, fever, night sweats or weight changes.  Cardiovascular:  No chest pain, dyspnea on exertion, edema, orthopnea, palpitations, paroxysmal nocturnal dyspnea. Dermatological: No rash, lesions/masses Respiratory: No cough, dyspnea Urologic: No hematuria, dysuria Musculoskeletal:endorses continued leg pain and discomfort Abdominal:   No nausea, vomiting, diarrhea, bright red blood per rectum, melena, or hematemesis Neurologic:  No visual changes, wkns, changes in mental status. All other systems reviewed and are otherwise negative except as noted above.   Physical Exam    VS:  BP 120/82   Pulse 82   Ht _0  (1.88 m)   Wt 261 lb 9.6 oz (118.7 kg)   SpO2 96%   BMI 33.59 kg/m  , BMI Body mass index is 33.59 kg/m.     GEN: Well nourished, well developed, in no acute  distress. HEENT: normal. Neck: Supple, no JVD, carotid bruits, or masses. Cardiac: RRR, no murmurs, rubs, or gallops. No clubbing, cyanosis, edema.  Radials 2+/PT 1+ and equal bilaterally.  Respiratory:  Respirations regular and unlabored, clear to auscultation bilaterally. GI: Soft, nontender, nondistended, BS + x 4. MS: no deformity or atrophy. Skin: warm and dry, no rash. Neuro:  Strength and sensation are intact. Psych: Normal affect.  Accessory Clinical Findings    ECG personally reviewed by me today- no new tracings completed today  Lab Results  Component Value Date   WBC 6.3 09/13/2021   HGB 15.5 09/13/2021   HCT 44.5 09/13/2021   MCV 85 09/13/2021  PLT 241 09/13/2021   Lab Results  Component Value Date   CREATININE 0.90 02/23/2022   BUN 19 02/23/2022   NA 135 02/23/2022   K 4.6 02/23/2022   CL 102 02/23/2022   CO2 18 (L) 02/23/2022   Lab Results  Component Value Date   ALT 18 02/23/2022   AST 16 02/23/2022   GGT 31 01/08/2017   ALKPHOS 66 02/23/2022   BILITOT 0.5 02/23/2022   Lab Results  Component Value Date   CHOL 120 09/13/2021   HDL 34 (L) 09/13/2021   LDLCALC 57 09/13/2021   TRIG 171 (H) 09/13/2021   CHOLHDL 3.5 09/13/2021    Lab Results  Component Value Date   HGBA1C 6.6 (H) 05/26/2022    Assessment & Plan   1.  Left leg pain has slightly improved.  He is continued on gabapentin.  ABIs resulted as normal.  2.  Paroxysmal atrial fibrillation/atrial flutter maintaining sinus rhythm today.  He remains asymptomatic.  He is continued on metoprolol tartrate 25 mg twice daily and apixaban 5 mg twice daily for CHA2DS2-VASc score of at least 2.  No episodes of bleeding since being started on anticoagulant therapy.  3.  Hypertension with blood pressure today of 120/82.  He is continue metoprolol tartrate 25 mg twice daily we will continue to monitor his blood pressure.  4.  Hyperlipidemia with recent LDL 57.  He is continue rosuvastatin 5 mg daily and  fish oil.  This continues to be managed by his PCP.  5.  Type 2 diabetes with A1c of 6.6.  He is continued on oral diabetic medications managed by his PCP.  6.  Disposition patient return to clinic to see MD/APP in 6 months or sooner if needed.  Vina Byrd, NP 08/22/2022, 12:43 PM

## 2022-08-23 ENCOUNTER — Encounter: Payer: Self-pay | Admitting: Family

## 2022-08-23 ENCOUNTER — Ambulatory Visit: Payer: BC Managed Care – PPO | Admitting: Family

## 2022-08-23 VITALS — BP 128/78 | HR 89 | Temp 97.8°F | Ht 74.0 in | Wt 262.0 lb

## 2022-08-23 DIAGNOSIS — G6289 Other specified polyneuropathies: Secondary | ICD-10-CM

## 2022-08-23 DIAGNOSIS — E118 Type 2 diabetes mellitus with unspecified complications: Secondary | ICD-10-CM

## 2022-08-23 DIAGNOSIS — Z0000 Encounter for general adult medical examination without abnormal findings: Secondary | ICD-10-CM

## 2022-08-23 DIAGNOSIS — E119 Type 2 diabetes mellitus without complications: Secondary | ICD-10-CM

## 2022-08-23 DIAGNOSIS — I10 Essential (primary) hypertension: Secondary | ICD-10-CM | POA: Diagnosis not present

## 2022-08-23 DIAGNOSIS — E7800 Pure hypercholesterolemia, unspecified: Secondary | ICD-10-CM | POA: Diagnosis not present

## 2022-08-23 MED ORDER — XIGDUO XR 5-1000 MG PO TB24: 2.0000 | ORAL_TABLET | Freq: Every morning | ORAL | 3 refills | Status: DC

## 2022-08-23 MED ORDER — ROSUVASTATIN CALCIUM 5 MG PO TABS: 5.0000 mg | ORAL_TABLET | Freq: Every day | ORAL | 3 refills | Status: DC

## 2022-08-23 NOTE — Assessment & Plan Note (Signed)
Chronic, stable. Continue metoprolol tartrate 25 mg bid.

## 2022-08-23 NOTE — Progress Notes (Signed)
Assessment & Plan:  DM type 2, controlled, with complication ([place]) Assessment & Plan:  Pending A1c.  Continue dapagliflozin - metformin 5-1066m two tablets QD , rybelsus 14 mg qd .    Orders: -     Hemoglobin A1c  Pure hypercholesterolemia -     Rosuvastatin Calcium; Take 1 tablet (5 mg total) by mouth daily.  Dispense: 90 tablet; Refill: 3 -     Lipid panel  Primary hypertension Assessment & Plan: Chronic, stable. Continue metoprolol tartrate 25 mg bid.    Healthcare maintenance  Diabetes mellitus without complication ([place] -     Xigduo XR; Take 2 tablets by mouth every morning.  Dispense: 180 tablet; Refill: 3  Other polyneuropathy Assessment & Plan: 08/14/22 right and left normal ankle-brachial index Presents with patient I suspect neuropathy is related to diabetes and/or lumbar stenosis.  He politely declines further evaluation or low back imaging at this time.  Explained we could titrate gabapentin as well.  Provide him with information as it relates to sciatica and exercises he can start.  He will let me know how he is doing.      Return precautions given.   Risks, benefits, and alternatives of the medications and treatment plan prescribed today were discussed, and patient expressed understanding.   Education regarding symptom management and diagnosis given to patient on AVS either electronically or printed.  Return in about 3 months (around 11/22/2022).  MMable Paris [name]  Subjective:    Patient ID: MNANCY MANUELE male    DOB: [birth year] 61y.o.   MRN: [id number] CC: Dustin DAFTis a 61y.o. male who presents today for follow up.   HPI: Complains of left leg numbness and left buttocks numbness x 3-4 weeks, comes and goes, improved.  Numbness is now limited to left calf and radiates down to left foot.    Improves with moving, worse with standing.  He is not taking ibuprofen.  He noticed it one morning when he got up.   No ha, vision  changes, fever, trouble urinating, urinary or fecal incontinence.   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  Allergies: Patient has no known allergies. Current Outpatient Medications on File Prior to Visit  Medication Sig Dispense Refill   apixaban (ELIQUIS) 5 MG TABS tablet Take 1 tablet (5 mg total) by mouth 2 (two) times daily. 60 tablet 6   blood glucose meter kit and supplies KIT Dispense based on patient and insurance preference. Use up to four times daily as directed. (FOR ICD-9 250.00, 250.01). 1 each 0   Cholecalciferol (VITAMIN D3) 2000 UNITS TABS Take 4,000 Units by mouth 2 (two) times daily.     ELDERBERRY PO Take 1 tablet by mouth daily.     gabapentin (NEURONTIN) 100 MG capsule Take 1 capsule (100 mg total) by mouth 3 (three) times daily. 90 capsule 0   ibuprofen (ADVIL,MOTRIN) 800 MG tablet Take 800 mg by mouth 2 (two) times daily as needed for fever, headache or mild pain.     metoprolol tartrate (LOPRESSOR) 25 MG tablet  Take 1 tablet (25 mg) by mouth twice daily 60 tablet 6   Omega-3 Fatty Acids (FISH OIL) 1000 MG CAPS Take 2,000 mg by mouth every morning. And takes 100 mg in the evening     [name] 14 MG TABS TAKE 1 TABLET BY MOUTH DAILY 90 tablet 1   vitamin B-12 (CYANOCOBALAMIN) 1000 MCG tablet Take 1,000 mcg by mouth daily.     No current facility-administered medications on file prior to visit.    Review of Systems  Constitutional:  Negative for chills and fever.  Respiratory:  Negative for cough.   Cardiovascular:  Negative for chest pain and palpitations.  Gastrointestinal:  Negative  for nausea and vomiting.  Musculoskeletal:  Negative for back pain.  Neurological:  Positive for numbness.      Objective:    BP 128/78   Pulse 89   Temp 97.8 F (36.6 C) (Oral)   Ht _0  (1.88 m)   Wt 262 lb (118.8 kg)   SpO2 98%   BMI 33.64 kg/m  BP Readings from Last 3 Encounters:  08/23/22 128/78  08/22/22 120/82  08/08/22 (!) 145/89   Wt Readings from Last 3 Encounters:  08/23/22 262 lb (118.8 kg)  08/22/22 261 lb 9.6 oz (118.7 kg)  08/08/22 267 lb 3.2 oz (121.2 kg)    Physical Exam Vitals reviewed.  Constitutional:      Appearance: He is well-developed.  Cardiovascular:     Rate and Rhythm: Regular rhythm.     Heart sounds: Normal heart sounds.  Pulmonary:     Effort: Pulmonary effort is normal. No respiratory distress.     Breath sounds: Normal breath sounds. No wheezing or rales.  Musculoskeletal:     Lumbar back: No swelling, spasms or tenderness. Normal range of motion.     Comments: Full range of motion with flexion, extension, lateral side bends. No pain, numbness, tingling elicited with single leg raise bilaterally. No rash.  Skin:    General: Skin is warm and dry.  Neurological:     Mental Status: He is alert.  Psychiatric:        Speech: Speech normal.        Behavior: Behavior normal.

## 2022-08-23 NOTE — Assessment & Plan Note (Signed)
Pending A1c.  Continue dapagliflozin - metformin 5-1000mg  two tablets QD , rybelsus 14 mg qd .

## 2022-08-23 NOTE — Assessment & Plan Note (Signed)
08/14/22 right and left normal ankle-brachial index Presents with patient I suspect neuropathy is related to diabetes and/or lumbar stenosis.  He politely declines further evaluation or low back imaging at this time.  Explained we could titrate gabapentin as well.  Provide him with information as it relates to sciatica and exercises he can start.  He will let me know how he is doing.

## 2022-08-23 NOTE — Patient Instructions (Signed)
Please let me know if you like to pursue further evaluation for left leg numbness.  I recommend lumbar x-ray I suspect sciatica, arthritic changes in the low back are likely playing a role.    we also could increase gabapentin.  Sciatica Rehab Ask your health care provider which exercises are safe for you. Do exercises exactly as told by your health care provider and adjust them as directed. It is normal to feel mild stretching, pulling, tightness, or discomfort as you do these exercises. Stop right away if you feel sudden pain or your pain gets worse. Do not begin these exercises until told by your health care provider. Stretching and range-of-motion exercises These exercises warm up your muscles and joints and improve the movement and flexibility of your hips and back. These exercises also help to relieve pain, numbness, and tingling. Sciatic nerve glide  Sit in a chair with your head facing down toward your chest. Place your hands behind your back. Let your shoulders slump forward. Slowly straighten one of your legs while you tilt your head back as if you are looking toward the ceiling. Only straighten your leg as far as you can without making your symptoms worse. Hold this position for __________ seconds. Slowly return your leg and head back to the starting position. Repeat with your other leg. Repeat __________ times. Complete this exercise __________ times a day. Knee to chest with hip adduction and internal rotation  Lie on your back on a firm surface with both legs straight. Bend one of your knees and move it up toward your chest until you feel a gentle stretch in your lower back and buttock. Then, move your knee toward the shoulder that is on the opposite side from your leg. This is hip adduction and internal rotation. Hold your leg in this position by holding on to the front of your knee. Hold this position for __________ seconds. Slowly return to the starting position. Repeat with  your other leg. Repeat __________ times. Complete this exercise __________ times a day. Prone extension on elbows  Lie on your abdomen on a firm surface. A bed may be too soft for this exercise. Prop yourself up on your elbows. Use your arms to help lift your chest up until you feel a gentle stretch in your abdomen and your lower back. This will place some of your body weight on your elbows. If this is uncomfortable, try stacking pillows under your chest. Your hips should stay down, against the surface that you are lying on. Keep your hip and back muscles relaxed. Hold this position for __________ seconds. Slowly relax your upper body and return to the starting position. Repeat __________ times. Complete this exercise __________ times a day. Strengthening exercises These exercises build strength and endurance in your back. Endurance is the ability to use your muscles for a long time, even after they get tired. Pelvic tilt This exercise strengthens the muscles that lie deep in the abdomen. Lie on your back on a firm surface. Bend your knees and keep your feet flat on the surface. Tense your abdominal muscles. Tip your pelvis up toward the ceiling and flatten your lower back into the firm surface. To help with this exercise, you may place a small towel under your lower back and try to push your back into the towel. Hold this position for __________ seconds. Let your muscles relax completely before you repeat this exercise. Repeat __________ times. Complete this exercise __________ times a day. Alternating arm  and leg raises  Get on your hands and knees on a firm surface. If you are on a hard floor, you may want to use padding, such as an exercise mat, to cushion your knees. Line up your arms and legs. Your hands should be directly below your shoulders, and your knees should be directly below your hips. Lift your left leg behind you. At the same time, raise your right arm and straighten it in  front of you. Do not lift your leg higher than your hip. Do not lift your arm higher than your shoulder. Keep your abdominal and back muscles tight. Keep your hips facing the ground. Do not arch your back. Keep your balance carefully, and do not hold your breath. Hold this position for __________ seconds. Slowly return to the starting position. Repeat with your right leg and your left arm. Repeat __________ times. Complete this exercise __________ times a day. Posture and body mechanics Good posture and healthy body mechanics can help to relieve stress in your body's tissues and joints. Body mechanics refers to the movements and positions of your body while you do your daily activities. Posture is part of body mechanics. Good posture means: Your spine is in its natural S-curve position (neutral). Your shoulders are pulled back slightly. Your head is not tipped forward. Follow these guidelines to improve your posture and body mechanics in your everyday activities. Standing  When standing, keep your spine neutral and your feet about hip width apart. Keep a slight bend in your knees. Your ears, shoulders, and hips should line up. When you do a task in which you stand in one place for a long time, place one foot up on a stable object that is 2-4 inches (5-10 cm) high, such as a footstool. This helps keep your spine neutral. Sitting  When sitting, keep your spine neutral and keep your feet flat on the floor. Use a footrest, if necessary, and keep your thighs parallel to the floor. Avoid rounding your shoulders, and avoid tilting your head forward. When working at a desk or a computer, keep your desk at a height where your hands are slightly lower than your elbows. Slide your chair under your desk so you are close enough to maintain good posture. When working at a computer, place your monitor at a height where you are looking straight ahead and you do not have to tilt your head forward or downward  to look at the screen. Resting  When lying down and resting, avoid positions that are most painful for you. If you have pain with activities such as sitting, bending, stooping, or squatting, lie in a position in which your body does not bend very much. For example, avoid curling up on your side with your arms and knees near your chest (fetal position). If you have pain with activities such as standing for a long time or reaching with your arms, lie with your spine in a neutral position and bend your knees slightly. Try the following positions: Lying on your side with a pillow between your knees. Lying on your back with a pillow under your knees. Lifting  When lifting objects, keep your feet at least shoulder width apart and tighten your abdominal muscles. Bend your knees and hips and keep your spine neutral. It is important to lift using the strength of your legs, not your back. Do not lock your knees straight out. Always ask for help to lift heavy or awkward objects. This information is not  intended to replace advice given to you by your health care provider. Make sure you discuss any questions you have with your health care provider. Document Revised: 12/06/2021 Document Reviewed: 12/06/2021 Elsevier Patient Education  [employer].

## 2022-09-19 ENCOUNTER — Other Ambulatory Visit: Payer: BC Managed Care – PPO

## 2022-09-19 DIAGNOSIS — E7800 Pure hypercholesterolemia, unspecified: Secondary | ICD-10-CM

## 2022-09-19 DIAGNOSIS — E118 Type 2 diabetes mellitus with unspecified complications: Secondary | ICD-10-CM

## 2022-09-20 LAB — LIPID PANEL
Chol/HDL Ratio: 3.2 ratio (ref 0.0–5.0)
Cholesterol, Total: 120 mg/dL (ref 100–199)
HDL: 38 mg/dL — ABNORMAL LOW (ref >39)
LDL Chol Calc (NIH): 53 mg/dL (ref 0–99)
Triglycerides: 175 mg/dL — ABNORMAL HIGH (ref 0–149)
VLDL Cholesterol Cal: 29 mg/dL (ref 5–40)

## 2022-09-20 LAB — HGB A1C W/O EAG: Hgb A1c MFr Bld: 7 % — ABNORMAL HIGH (ref 4.8–5.6)

## 2022-10-27 ENCOUNTER — Other Ambulatory Visit: Payer: Self-pay | Admitting: Cardiovascular Disease

## 2022-10-27 NOTE — Telephone Encounter (Signed)
Please assist patient with Eliquis refill.  Thank you.

## 2022-10-27 NOTE — Telephone Encounter (Signed)
Prescription refill request for Eliquis received.  Indication: aflutter Last office visit: [place], 08/22/2022 Scr: 0.90, 02/23/2022 Age: 62 yo  Weight: 118 kg   Refill sent.

## 2022-11-22 ENCOUNTER — Ambulatory Visit: Payer: BC Managed Care – PPO | Admitting: Family

## 2022-11-22 ENCOUNTER — Ambulatory Visit (INDEPENDENT_AMBULATORY_CARE_PROVIDER_SITE_OTHER): Payer: BC Managed Care – PPO

## 2022-11-22 ENCOUNTER — Encounter: Payer: Self-pay | Admitting: Family

## 2022-11-22 VITALS — BP 122/80 | HR 93 | Temp 98.4°F | Ht 74.0 in | Wt 264.8 lb

## 2022-11-22 DIAGNOSIS — M5136 Other intervertebral disc degeneration, lumbar region: Secondary | ICD-10-CM | POA: Diagnosis not present

## 2022-11-22 DIAGNOSIS — I4892 Unspecified atrial flutter: Secondary | ICD-10-CM | POA: Diagnosis not present

## 2022-11-22 DIAGNOSIS — Z1211 Encounter for screening for malignant neoplasm of colon: Secondary | ICD-10-CM | POA: Diagnosis not present

## 2022-11-22 DIAGNOSIS — M545 Low back pain, unspecified: Secondary | ICD-10-CM

## 2022-11-22 DIAGNOSIS — E118 Type 2 diabetes mellitus with unspecified complications: Secondary | ICD-10-CM

## 2022-11-22 DIAGNOSIS — G6289 Other specified polyneuropathies: Secondary | ICD-10-CM

## 2022-11-22 DIAGNOSIS — M47816 Spondylosis without myelopathy or radiculopathy, lumbar region: Secondary | ICD-10-CM | POA: Diagnosis not present

## 2022-11-22 DIAGNOSIS — G8929 Other chronic pain: Secondary | ICD-10-CM | POA: Diagnosis not present

## 2022-11-22 NOTE — Patient Instructions (Addendum)
For low back pain, you may take Tylenol as needed. I would recommend taking gabapentin as previously prescribed if neuropathy is bothersome Your labs will be due 12/20/22 or soon thereafter.

## 2022-11-22 NOTE — Assessment & Plan Note (Addendum)
No radicular symptoms at this time Suspect degenerative disc disease.  Advised he may use Tylenol.  He must avoid NSAIDs.  Pending lumbar x-ray

## 2022-11-22 NOTE — Assessment & Plan Note (Signed)
No history of B12 deficiency.  Normal ankle-brachial index 08/2022.  Pending lumbar x-ray to evaluate for severe degenerative disc disease.  Otherwise discussed with patient I suspect neuropathy is related to longstanding diabetes.  Advised he may start gabapentin if symptom is bothersome

## 2022-11-22 NOTE — Assessment & Plan Note (Signed)
Lab Results  Component Value Date   HGBA1C 7.0 (H) 09/19/2022  Slightly elevated after the holidays Continue dapagliflozin - metformin 5-'1000mg'$  two tablets QD , rybelsus 14 mg qd .

## 2022-11-22 NOTE — Progress Notes (Signed)
Assessment & Plan:  Chronic midline low back pain without sciatica Assessment & Plan: No radicular symptoms at this time Suspect degenerative disc disease.  Advised he may use Tylenol.  He must avoid NSAIDs.  Pending lumbar x-ray  Orders: -     DG Lumbar Spine Complete; Future  Screening for colon cancer -     Ambulatory referral to Gastroenterology  DM type 2, controlled, with complication [facility]) Assessment & Plan: Lab Results  Component Value Date   HGBA1C 7.0 (H) 09/19/2022  Slightly elevated after the holidays Continue dapagliflozin - metformin 5-'1000mg'$  two tablets QD , rybelsus 14 mg qd .    Orders: -     Comprehensive metabolic panel -     Hemoglobin A1c -     Microalbumin / creatinine urine ratio  Atrial flutter, unspecified type ([place]) -     CBC with Differential/Platelet  Other polyneuropathy Assessment & Plan: No history of B12 deficiency.  Normal ankle-brachial index 08/2022.  Pending lumbar x-ray to evaluate for severe degenerative disc disease.  Otherwise discussed with patient I suspect neuropathy is related to longstanding diabetes.  Advised he may start gabapentin if symptom is bothersome      Return precautions given.   Risks, benefits, and alternatives of the medications and treatment plan prescribed today were discussed, and patient expressed understanding.   Education regarding symptom management and diagnosis given to patient on AVS either electronically or printed.  No follow-ups on file.  [name], [name]  Subjective:    Patient ID: Dustin Fletcher, male    DOB: [birth year], 62 y.o.   MRN: [id number]  CC: Dustin Fletcher is a 62 y.o. male who presents today for follow up.   HPI: Feels well today No new complaints  No cp, palpitations.   He continues to have left foot numbness from ankle to toes. He had episode recently at a ball game in small chair when left leg was numb which is unusual for him.  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  no saddle anesthesia, urinary or bowel incontinence.  He is not taking gabapentin    Follow-up cardiology 08/22/2022 for paroxysmal atrial fibrillation/atrial flutter.  Compliant with Toprol 25 mg twice daily.  Compliant with Eliquis 5 mg twice daily Allergies: Patient has no known allergies. Current Outpatient Medications on File Prior to Visit  Medication Sig Dispense Refill   apixaban (ELIQUIS) 5 MG TABS tablet TAKE 1 TABLET BY MOUTH TWICE A DAY 60 tablet 5   blood glucose meter kit and supplies KIT Dispense based on patient and insurance preference. Use up to four times daily as directed. (FOR ICD-9 250.00, 250.01). 1 each 0   Cholecalciferol (VITAMIN D3) 2000 UNITS TABS Take 4,000 Units by mouth 2 (two) times daily.     Chromium Picolinate 500 MCG CAPS Take by mouth. Pt takes one 500 mg tablet daily     Dapagliflozin Pro-metFORMIN ER (XIGDUO XR) 01-999 MG TB24 Take 2 tablets by mouth every morning. 180 tablet 3   ELDERBERRY PO Take 1 tablet by mouth daily.     gabapentin (NEURONTIN) 100 MG capsule Take 1 capsule (100 mg total) by mouth 3 (three) times daily. 90 capsule 0   ibuprofen (ADVIL,MOTRIN) 800 MG tablet Take 800 mg by mouth 2 (two) times daily as needed for fever, headache or mild pain.     metoprolol tartrate (LOPRESSOR) 25 MG tablet TAKE 1 TABLET BY MOUTH TWICE A DAY 60 tablet  3   Omega-3 Fatty Acids (FISH OIL) 1000 MG CAPS Take 2,000 mg by mouth every morning. And takes 100 mg in the evening     rosuvastatin (CRESTOR) 5 MG tablet Take 1 tablet (5 mg total) by mouth daily. 90 tablet 3   [name] 14 MG TABS TAKE 1 TABLET BY MOUTH DAILY 90 tablet 1   vitamin B-12 (CYANOCOBALAMIN) 1000 MCG tablet Take 1,000 mcg by mouth daily.     No current facility-administered medications on file prior to visit.    Review of Systems  Constitutional:  Negative for chills and fever.  Respiratory:  Negative for cough.   Cardiovascular:  Negative for chest pain and  palpitations.  Gastrointestinal:  Negative for nausea and vomiting.  Musculoskeletal:  Positive for back pain.      Objective:    BP 122/80   Pulse 93   Temp 98.4 F (36.9 C) (Oral)   Ht '6\' 2"'$  (1.88 m)   Wt 264 lb 12.8 oz (120.1 kg)   SpO2 99%   BMI 34.00 kg/m  BP Readings from Last 3 Encounters:  11/22/22 122/80  08/23/22 128/78  08/22/22 120/82   Wt Readings from Last 3 Encounters:  11/22/22 264 lb 12.8 oz (120.1 kg)  08/23/22 262 lb (118.8 kg)  08/22/22 261 lb 9.6 oz (118.7 kg)    Physical Exam Vitals reviewed.  Constitutional:      Appearance: He is well-developed.  Cardiovascular:     Rate and Rhythm: Regular rhythm.     Heart sounds: Normal heart sounds.  Pulmonary:     Effort: Pulmonary effort is normal. No respiratory distress.     Breath sounds: Normal breath sounds. No wheezing, rhonchi or rales.  Musculoskeletal:     Lumbar back: No swelling, spasms or tenderness. Normal range of motion.     Comments: Full range of motion with flexion, extension, lateral side bends. No pain, numbness, tingling elicited with single leg raise bilaterally. No rash.  Lymphadenopathy:     Head:     Left side of head: No submandibular or preauricular adenopathy.  Skin:    General: Skin is warm and dry.  Neurological:     Mental Status: He is alert.  Psychiatric:        Speech: Speech normal.        Behavior: Behavior normal.

## 2022-11-27 ENCOUNTER — Other Ambulatory Visit: Payer: BC Managed Care – PPO

## 2022-11-27 DIAGNOSIS — I4892 Unspecified atrial flutter: Secondary | ICD-10-CM

## 2022-11-27 DIAGNOSIS — E118 Type 2 diabetes mellitus with unspecified complications: Secondary | ICD-10-CM

## 2022-11-28 LAB — CBC WITH DIFFERENTIAL/PLATELET
Basophils Absolute: 0.1 10*3/uL (ref 0.0–0.2)
Basos: 1 %
EOS (ABSOLUTE): 0.2 10*3/uL (ref 0.0–0.4)
Eos: 4 %
Hematocrit: 44.9 % (ref 37.5–51.0)
Hemoglobin: 15.1 g/dL (ref 13.0–17.7)
Immature Grans (Abs): 0 10*3/uL (ref 0.0–0.1)
Immature Granulocytes: 0 %
Lymphocytes Absolute: 1.6 10*3/uL (ref 0.7–3.1)
Lymphs: 29 %
MCH: 30.2 pg (ref 26.6–33.0)
MCHC: 33.6 g/dL (ref 31.5–35.7)
MCV: 90 fL (ref 79–97)
Monocytes Absolute: 0.5 10*3/uL (ref 0.1–0.9)
Monocytes: 9 %
Neutrophils Absolute: 3.1 10*3/uL (ref 1.4–7.0)
Neutrophils: 57 %
Platelets: 257 10*3/uL (ref 150–450)
RBC: 5 x10E6/uL (ref 4.14–5.80)
RDW: 13.9 % (ref 11.6–15.4)
WBC: 5.5 10*3/uL (ref 3.4–10.8)

## 2022-11-28 LAB — COMPREHENSIVE METABOLIC PANEL
ALT: 21 IU/L (ref 0–44)
AST: 16 IU/L (ref 0–40)
Albumin/Globulin Ratio: 1.9 (ref 1.2–2.2)
Albumin: 4.6 g/dL (ref 3.9–4.9)
Alkaline Phosphatase: 80 IU/L (ref 44–121)
BUN/Creatinine Ratio: 17 (ref 10–24)
BUN: 14 mg/dL (ref 8–27)
Bilirubin Total: 0.4 mg/dL (ref 0.0–1.2)
CO2: 23 mmol/L (ref 20–29)
Calcium: 9.6 mg/dL (ref 8.6–10.2)
Chloride: 105 mmol/L (ref 96–106)
Creatinine, Ser: 0.83 mg/dL (ref 0.76–1.27)
Globulin, Total: 2.4 g/dL (ref 1.5–4.5)
Glucose: 135 mg/dL — ABNORMAL HIGH (ref 70–99)
Potassium: 4.3 mmol/L (ref 3.5–5.2)
Sodium: 141 mmol/L (ref 134–144)
Total Protein: 7 g/dL (ref 6.0–8.5)
eGFR: 100 mL/min/{1.73_m2} (ref >59)

## 2022-11-28 LAB — HEMOGLOBIN A1C
Est. average glucose Bld gHb Est-mCnc: 154 mg/dL
Hgb A1c MFr Bld: 7 % — ABNORMAL HIGH (ref 4.8–5.6)

## 2022-11-29 LAB — MICROALBUMIN / CREATININE URINE RATIO
Creatinine, Urine: 120.3 mg/dL
Microalb/Creat Ratio: 13 mg/g creat (ref 0–29)
Microalbumin, Urine: 15.5 ug/mL

## 2022-11-30 ENCOUNTER — Encounter: Payer: Self-pay | Admitting: *Deleted

## 2023-01-01 ENCOUNTER — Ambulatory Visit: Payer: BC Managed Care – PPO | Admitting: Family

## 2023-01-01 ENCOUNTER — Encounter: Payer: Self-pay | Admitting: Family

## 2023-01-01 VITALS — BP 128/78 | HR 78 | Temp 97.8°F | Ht 74.0 in | Wt 267.8 lb

## 2023-01-01 DIAGNOSIS — I10 Essential (primary) hypertension: Secondary | ICD-10-CM | POA: Diagnosis not present

## 2023-01-01 DIAGNOSIS — G8929 Other chronic pain: Secondary | ICD-10-CM

## 2023-01-01 DIAGNOSIS — E118 Type 2 diabetes mellitus with unspecified complications: Secondary | ICD-10-CM | POA: Diagnosis not present

## 2023-01-01 DIAGNOSIS — M545 Low back pain, unspecified: Secondary | ICD-10-CM | POA: Diagnosis not present

## 2023-01-01 NOTE — Assessment & Plan Note (Signed)
Improved from prior.  Reviewed lumbar and bilateral hip x-rays today.  Patient politely declines any further evaluation ; declines trial gabapentin at this time.  He will let me know how he is doing and certainly if back pain were to become persistent or interfere with his QOL.

## 2023-01-01 NOTE — Progress Notes (Signed)
Assessment & Plan:  DM type 2, controlled, with complication Assessment & Plan:  Lab Results  Component Value Date   HGBA1C 7.0 (H) 11/27/2022  Improved from prior.  Discussed low glycemic diet and provided information in regards to carbohydrate counting. Continue dapagliflozin - metformin 5-1000mg  two tablets QD , rybelsus 14 mg qd .   Orders: -     Comprehensive metabolic panel -     Hemoglobin A1c  Primary hypertension Assessment & Plan: Chronic, stable. Continue metoprolol tartrate 25 mg bid. Consider ARB at follow up.    Chronic midline low back pain without sciatica Assessment & Plan: Improved from prior.  Reviewed lumbar and bilateral hip x-rays today.  Patient politely declines any further evaluation ; declines trial gabapentin at this time.  He will let me know how he is doing and certainly if back pain were to become persistent or interfere with his QOL.       Return precautions given.   Risks, benefits, and alternatives of the medications and treatment plan prescribed today were discussed, and patient expressed understanding.   Education regarding symptom management and diagnosis given to patient on AVS either electronically or printed.  Return in about 3 months (around 04/02/2023).  Rennie Plowman, [name]  Subjective:    Patient ID: Dustin Fletcher, male    DOB: 1960-10-05, 62 y.o.   MRN: [id number]  CC: Dustin Fletcher is a 62 y.o. male who presents today for follow up.   HPI: Feels well today.  No new complaints.  In the past he has done a diabetic class.  There are times when he is unsure he feels overwhelmed by eating choices in the setting of diabetes.  He overall eats smaller portions he does pay attention to carbs and sugar.  He will occasionally eat a couple of pieces of candy to curb sweet tooth.   He is not bothered by low back pain.  Low back pain has improved at this time.  Back pain does not limit his ability to work.  He does not want to see  an orthopedic at this time.  He is not taking gabapentin No numbness in posterior legs.  No weakness in his legs. He continues to have chronic numbness in his feet.  This is unchanged     Allergies: Patient has no known allergies. Current Outpatient Medications on File Prior to Visit  Medication Sig Dispense Refill   apixaban (ELIQUIS) 5 MG TABS tablet TAKE 1 TABLET BY MOUTH TWICE A DAY 60 tablet 5   blood glucose meter kit and supplies KIT Dispense based on patient and insurance preference. Use up to four times daily as directed. (FOR ICD-9 250.00, 250.01). 1 each 0   Cholecalciferol (VITAMIN D3) 2000 UNITS TABS Take 4,000 Units by mouth 2 (two) times daily.     Chromium Picolinate 500 MCG CAPS Take by mouth. Pt takes one 500 mg tablet daily     Dapagliflozin Pro-metFORMIN ER (XIGDUO XR) 01-999 MG TB24 Take 2 tablets by mouth every morning. 180 tablet 3   ELDERBERRY PO Take 1 tablet by mouth daily.     ibuprofen (ADVIL,MOTRIN) 800 MG tablet Take 800 mg by mouth 2 (two) times daily as needed for fever, headache or mild pain.     metoprolol tartrate (LOPRESSOR) 25 MG tablet TAKE 1 TABLET BY MOUTH TWICE A DAY 60 tablet 3   Omega-3 Fatty Acids (FISH OIL) 1000 MG CAPS Take 2,000 mg by mouth every morning. And takes  100 mg in the evening     rosuvastatin (CRESTOR) 5 MG tablet Take 1 tablet (5 mg total) by mouth daily. 90 tablet 3   [name] 14 MG TABS TAKE 1 TABLET BY MOUTH DAILY 90 tablet 1   vitamin B-12 (CYANOCOBALAMIN) 1000 MCG tablet Take 1,000 mcg by mouth daily.     No current facility-administered medications on file prior to visit.    Review of Systems  Constitutional:  Negative for chills and fever.  Respiratory:  Negative for cough.   Cardiovascular:  Negative for chest pain and palpitations.  Gastrointestinal:  Negative for nausea and vomiting.  Musculoskeletal:  Positive for back pain (not bothersome at this time).  Neurological:  Positive for numbness.      Objective:     BP 128/78   Pulse 78   Temp 97.8 F (36.6 C) (Oral)   Ht  (1.88 m)   Wt 267 lb 12.8 oz (121.5 kg)   SpO2 99%   BMI 34.38 kg/m  BP Readings from Last 3 Encounters:  01/01/23 128/78  11/22/22 122/80  08/23/22 128/78   Wt Readings from Last 3 Encounters:  01/01/23 267 lb 12.8 oz (121.5 kg)  11/22/22 264 lb 12.8 oz (120.1 kg)  08/23/22 262 lb (118.8 kg)    Physical Exam Vitals reviewed.  Constitutional:      Appearance: He is well-developed.  Cardiovascular:     Rate and Rhythm: Regular rhythm.     Heart sounds: Normal heart sounds.  Pulmonary:     Effort: Pulmonary effort is normal. No respiratory distress.     Breath sounds: Normal breath sounds. No wheezing, rhonchi or rales.  Skin:    General: Skin is warm and dry.  Neurological:     Mental Status: He is alert.  Psychiatric:        Speech: Speech normal.        Behavior: Behavior normal.

## 2023-01-01 NOTE — Patient Instructions (Signed)
Carbohydrate Counting for Diabetes Mellitus, Adult Carbohydrate counting is a method of keeping track of how many carbohydrates you eat. Eating carbohydrates increases the amount of sugar (glucose) in the blood. Counting how many carbohydrates you eat improves how well you manage your blood glucose. This, in turn, helps you manage your diabetes. Carbohydrates are measured in grams (g) per serving. It is important to know how many carbohydrates (in grams or by serving size) you can have in each meal. This is different for every person. A dietitian can help you make a meal plan and calculate how many carbohydrates you should have at each meal and snack. What foods contain carbohydrates? Carbohydrates are found in the following foods: Grains, such as breads and cereals. Dried beans and soy products. Starchy vegetables, such as potatoes, peas, and corn. Fruit and fruit juices. Milk and yogurt. Sweets and snack foods, such as cake, cookies, candy, chips, and soft drinks. How do I count carbohydrates in foods? There are two ways to count carbohydrates in food. You can read food labels or learn standard serving sizes of foods. You can use either of these methods or a combination of both. Using the Nutrition Facts label The Nutrition Facts list is included on the labels of almost all packaged foods and beverages in the [place]. It includes: The serving size. Information about nutrients in each serving, including the grams of carbohydrate per serving. To use the Nutrition Facts, decide how many servings you will have. Then, multiply the number of servings by the number of carbohydrates per serving. The resulting number is the total grams of carbohydrates that you will be having. Learning the standard serving sizes of foods When you eat carbohydrate foods that are not packaged or do not include Nutrition Facts on the label, you need to measure the servings in order to count the grams of  carbohydrates. Measure the foods that you will eat with a food scale or measuring cup, if needed. Decide how many standard-size servings you will eat. Multiply the number of servings by 15. For foods that contain carbohydrates, one serving equals 15 g of carbohydrates. For example, if you eat 2 cups or 10 oz (300 g) of strawberries, you will have eaten 2 servings and 30 g of carbohydrates (2 servings x 15 g = 30 g). For foods that have more than one food mixed, such as soups and casseroles, you must count the carbohydrates in each food that is included. The following list contains standard serving sizes of common carbohydrate-rich foods. Each of these servings has about 15 g of carbohydrates: 1 slice of bread. 1 six-inch (15 cm) tortilla. ? cup or 2 oz (53 g) cooked rice or pasta.  cup or 3 oz (85 g) cooked or canned, drained and rinsed beans or lentils.  cup or 3 oz (85 g) starchy vegetable, such as peas, corn, or squash.  cup or 4 oz (120 g) hot cereal.  cup or 3 oz (85 g) boiled or mashed potatoes, or  or 3 oz (85 g) of a large baked potato.  cup or 4 fl oz (118 mL) fruit juice. 1 cup or 8 fl oz (237 mL) milk. 1 small or 4 oz (106 g) apple.  or 2 oz (63 g) of a medium banana. 1 cup or 5 oz (150 g) strawberries. 3 cups or 1 oz (28.3 g) popped popcorn. What is an example of carbohydrate counting? To calculate the grams of carbohydrates in this sample meal, follow the  steps shown below. Sample meal 3 oz (85 g) chicken breast. ? cup or 4 oz (106 g) brown rice.  cup or 3 oz (85 g) corn. 1 cup or 8 fl oz (237 mL) milk. 1 cup or 5 oz (150 g) strawberries with sugar-free whipped topping. Carbohydrate calculation Identify the foods that contain carbohydrates: Rice. Corn. Milk. Strawberries. Calculate how many servings you have of each food: 2 servings rice. 1 serving corn. 1 serving milk. 1 serving strawberries. Multiply each number of servings by 15 g: 2 servings rice x 15  g = 30 g. 1 serving corn x 15 g = 15 g. 1 serving milk x 15 g = 15 g. 1 serving strawberries x 15 g = 15 g. Add together all of the amounts to find the total grams of carbohydrates eaten: 30 g + 15 g + 15 g + 15 g = 75 g of carbohydrates total. What are tips for following this plan? Shopping Develop a meal plan and then make a shopping list. Buy fresh and frozen vegetables, fresh and frozen fruit, dairy, eggs, beans, lentils, and whole grains. Look at food labels. Choose foods that have more fiber and less sugar. Avoid processed foods and foods with added sugars. Meal planning Aim to have the same number of grams of carbohydrates at each meal and for each snack time. Plan to have regular, balanced meals and snacks. Where to find more information American Diabetes Association: diabetes.org Centers for Disease Control and Prevention: TonerPromos.no Academy of Nutrition and Dietetics: eatright.org Association of Diabetes Care & Education Specialists: diabeteseducator.org Summary Carbohydrate counting is a method of keeping track of how many carbohydrates you eat. Eating carbohydrates increases the amount of sugar (glucose) in your blood. Counting how many carbohydrates you eat improves how well you manage your blood glucose. This helps you manage your diabetes. A dietitian can help you make a meal plan and calculate how many carbohydrates you should have at each meal and snack. This information is not intended to replace advice given to you by your health care provider. Make sure you discuss any questions you have with your health care provider. Document Revised: 03/31/2020 Document Reviewed: 03/31/2020 Elsevier Patient Education  2023 [employer]. This is  Dr. Melina Schools  example of a  "Low GI"  Diet:  It will allow you to lose 4 to 8  lbs  per month if you follow it carefully.  Your goal with exercise is a minimum of 30 minutes of aerobic exercise 5 days per week (Walking does not count once it  becomes easy!)    All of the foods can be found at grocery stores and in bulk at [employer].  The Atkins protein bars and shakes are available in more varieties at [employer], [employer] and [employer] Foods.     7 AM Breakfast:  Choose from the following:  Low carbohydrate Protein  Shakes (I recommend the  Premier Protein chocolate shakes,  EAS AdvantEdge "Carb Control" shakes  Or the Atkins shakes all are under 3 net carbs)     a scrambled egg/bacon/cheese burrito made with Mission's "carb balance" whole wheat tortilla  (about 10 net carbs )  Medical laboratory scientific officer (basically a quiche without the pastry crust) that is eaten cold and very convenient way to get your eggs.  8 carbs)  If you make your own protein shakes, avoid bananas and pineapple,  And use low carb greek yogurt or original /unsweetened almond or soy milk  Avoid cereal and bananas, oatmeal and cream of wheat and grits. They are loaded with carbohydrates!   10 AM: high protein snack:  Protein bar by Atkins (the snack size, under 200 cal, usually < 6 net carbs).    A stick of cheese:  Around 1 carb,  100 cal     Dannon Light n Fit [place] Yogurt  (80 cal, 8 carbs)  Other so called "protein bars" and Greek yogurts tend to be loaded with carbohydrates.  Remember, in food advertising, the word "energy" is synonymous for " carbohydrate."  Lunch:   A Sandwich using the bread choices listed, Can use any  Eggs,  lunchmeat, grilled meat or canned tuna), avocado, regular mayo/mustard  and cheese.  A Salad using blue cheese, ranch,  Goddess or vinagrette,  Avoid taco shells, croutons or "confetti" and no "candied nuts" but regular nuts OK.   No pretzels, nabs  or chips.  Pickles and miniature sweet peppers are a good low carb alternative that provide a "crunch"  The bread is the only source of carbohydrate in a sandwich and  can be decreased by trying some of the attached alternatives to traditional loaf bread   Avoid "Low fat  dressings, as well as Reyne Dumas and [employer] dressings They are loaded with sugar!   3 PM/ Mid day  Snack:  Consider  1 ounce of  almonds, walnuts, pistachios, pecans, peanuts,  Macadamia nuts or a nut medley.  Avoid "granola and granola bars "  Mixed nuts are ok in moderation as long as there are no raisins,  cranberries or dried fruit.   KIND bars are OK if you get the low glycemic index variety   Try the prosciutto/mozzarella cheese sticks by F[employer]ruci  In deli /backery section   High protein      6 PM  Dinner:     Meat/fowl/fish with a green salad, and either broccoli, cauliflower, green beans, spinach, brussel sprouts or  Lima beans. DO NOT BREAD THE PROTEIN!!      There is a low carb pasta by Dreamfield's that is acceptable and tastes great: only 5 digestible carbs/serving.( All grocery stores but BJs carry it ) Several ready made meals are available low carb:   Try Michel Angelo's chicken piccata or chicken or eggplant parm over low carb pasta.(Lowes and BJs)   Clifton Custard Sanchez's "Carnitas" (pulled pork, no sauce,  0 carbs) or his beef pot roast to make a dinner burrito (at [employer]J's)  Pesto over low carb pasta (bj's sells a good quality pesto in the center refrigerated section of the deli   Try satueeing  Roosvelt Harps with mushroooms as a good side   [employer] makes a mashed cauliflower that tastes like mashed potatoes  Whole wheat pasta is still full of digestible carbs and  Not as low in glycemic index as Dreamfield's.   Brown rice is still rice,  So skip the rice and noodles if you eat [place] or [place] (or at least limit to 1/2 cup)  9 PM snack :   Breyer's "low carb" fudgsicle or  ice cream bar (Carb Smart line), or  Weight Watcher's ice cream bar , or another "no sugar added" ice cream;  a serving of fresh berries/cherries with whipped cream   Cheese or DANNON'S LlGHT N FIT GREEK YOGURT  8 ounces of [place] unsweetened almond/cococunut milk    Treat yourself to a parfait made  with whipped cream blueberiies, walnuts and vanilla greek yogurt  Avoid bananas, pineapple, grapes  and watermelon on a regular basis because they are high in sugar.  THINK OF THEM AS DESSERT  Remember that snack Substitutions should be less than 10 NET carbs per serving and meals < 20 carbs. Remember to subtract fiber grams to get the "net carbs."

## 2023-01-01 NOTE — Assessment & Plan Note (Addendum)
Chronic, stable. Continue metoprolol tartrate 25 mg bid. Consider ARB at follow up.

## 2023-01-01 NOTE — Assessment & Plan Note (Signed)
  Lab Results  Component Value Date   HGBA1C 7.0 (H) 11/27/2022  Improved from prior.  Discussed low glycemic diet and provided information in regards to carbohydrate counting. Continue dapagliflozin - metformin 5-1000mg  two tablets QD , rybelsus 14 mg qd .

## 2023-02-13 ENCOUNTER — Other Ambulatory Visit: Payer: Self-pay | Admitting: Family

## 2023-02-13 ENCOUNTER — Ambulatory Visit: Payer: BC Managed Care – PPO | Admitting: Cardiovascular Disease

## 2023-02-13 DIAGNOSIS — E118 Type 2 diabetes mellitus with unspecified complications: Secondary | ICD-10-CM

## 2023-02-20 ENCOUNTER — Other Ambulatory Visit: Payer: Self-pay | Admitting: Cardiovascular Disease

## 2023-02-21 NOTE — Progress Notes (Signed)
Cardiology Office Note   Date:  02/22/2023   ID:  Dustin Fletcher, DOB 03/09/1961, MRN [id number]  PCP:  Allegra Grana, [name]  Cardiologist:   Lorine Bears, MD   Chief Complaint  Patient presents with   Follow-up    6 month f/u c/o fluctuating HR. Meds reviewed verbally with pt.      History of Present Illness: Dustin Fletcher is a 62 y.o. male who is here today for follow-up visit regarding paroxysmal atrial flutter.   He has past medical history of diabetes mellitus, essential hypertension and hyperlipidemia.  The patient is a non-smoker and does not drink alcohol.  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   He was seen by me in July 2023 and was noted to be in atrial flutter with rapid ventricular response.  I switched him from lisinopril to metoprolol and was subsequently noted to be in sinus rhythm.  He was started on anticoagulation with Eliquis given CHA2DS2-VASc score of 2.  He had an echocardiogram done which showed normal LV systolic function with no significant valvular abnormalities. He reports intermittent episodes of palpitations and tachycardia happening every few days.  When he gets these episodes, he gets burning sensation in his chest.  In addition, he had few episodes of burning sensation in the chest with physical activities.  No shortness of breath.  No syncope.  Past Medical History:  Diagnosis Date   Bell's palsy    Diabetes mellitus without complication (HCC)    Hyperlipidemia    Hypertension     Past Surgical History:  Procedure Laterality Date   HERNIA REPAIR       Current Outpatient Medications  Medication Sig Dispense Refill   apixaban (ELIQUIS) 5 MG TABS tablet TAKE 1 TABLET BY MOUTH TWICE A DAY 60 tablet 5   blood glucose meter kit and supplies KIT Dispense based on patient and insurance preference. Use up to four times daily as directed. (FOR ICD-9 250.00, 250.01). 1 each 0   Cholecalciferol (VITAMIN D3) 2000 UNITS TABS Take  4,000 Units by mouth 2 (two) times daily.     Chromium Picolinate 500 MCG CAPS Take by mouth. Pt takes one 500 mg tablet daily     Dapagliflozin Pro-metFORMIN ER (XIGDUO XR) 01-999 MG TB24 Take 2 tablets by mouth every morning. 180 tablet 3   ELDERBERRY PO Take 1 tablet by mouth daily.     ibuprofen (ADVIL,MOTRIN) 800 MG tablet Take 800 mg by mouth 2 (two) times daily as needed for fever, headache or mild pain.     metoprolol tartrate (LOPRESSOR) 25 MG tablet TAKE 1 TABLET BY MOUTH TWICE A DAY 60 tablet 0   NON FORMULARY Berberine Ceylon Cinnamon Take 500 mg twice daily.     Omega-3 Fatty Acids (FISH OIL) 1000 MG CAPS Take 2,000 mg by mouth every morning. And takes 100 mg in the evening     rosuvastatin (CRESTOR) 5 MG tablet Take 1 tablet (5 mg total) by mouth daily. 90 tablet 3   [name] 14 MG TABS TAKE 1 TABLET BY MOUTH DAILY 90 tablet 1   vitamin B-12 (CYANOCOBALAMIN) 1000 MCG tablet Take 1,000 mcg by mouth daily.     No current facility-administered medications for this visit.    Allergies:   Patient has no known allergies.    Social History:  The patient  reports that he quit smoking about 41 years ago. His smoking use included cigarettes. He has never used smokeless tobacco.  He reports that he does not drink alcohol and does not use drugs.   Family History:  The patient's family history includes Cancer in his father and mother; Diabetes in his father; Heart disease in his brother; Hypertension in his brother and father.    ROS:  Please see the history of present illness.   Otherwise, review of systems are positive for none.   All other systems are reviewed and negative.    PHYSICAL EXAM: VS:  BP 100/70 (BP Location: Left Arm, Patient Position: Sitting, Cuff Size: Large)   Pulse 79   Ht 6\' 2"  (1.88 m)   Wt 262 lb 2 oz (118.9 kg)   SpO2 97%   BMI 33.65 kg/m  , BMI Body mass index is 33.65 kg/m. GEN: Well nourished, well developed, in no acute distress  HEENT: normal  Neck:  no JVD, carotid bruits, or masses Cardiac: Regular rate and rhythm; no murmurs, rubs, or gallops,no edema  Respiratory:  clear to auscultation bilaterally, normal work of breathing GI: soft, nontender, nondistended, + BS MS: no deformity or atrophy  Skin: warm and dry, no rash Neuro:  Strength and sensation are intact Psych: euthymic mood, full affect   EKG:  EKG is ordered today. The ekg ordered today demonstrates normal sinus rhythm with first-degree AV block.   Recent Labs: 11/27/2022: ALT 21; BUN 14; Creatinine, Ser 0.83; Hemoglobin 15.1; Platelets 257; Potassium 4.3; Sodium 141    Lipid Panel    Component Value Date/Time   CHOL 120 09/19/2022 0716   TRIG 175 (H) 09/19/2022 0716   HDL 38 (L) 09/19/2022 0716   CHOLHDL 3.2 09/19/2022 0716   LDLCALC 53 09/19/2022 0716      Wt Readings from Last 3 Encounters:  02/22/23 262 lb 2 oz (118.9 kg)  01/01/23 267 lb 12.8 oz (121.5 kg)  11/22/22 264 lb 12.8 oz (120.1 kg)         04/04/2022    3:08 PM  PAD Screen  Previous PAD dx? No  Previous surgical procedure? No  Pain with walking? No  Feet/toe relief with dangling? No  Painful, non-healing ulcers? No  Extremities discolored? No      ASSESSMENT AND PLAN:  1.  Paroxysmal atrial flutter: He is currently in sinus rhythm but he reports intermittent episodes of palpitations and tachycardia.  I requested a 2 weeks ZIO monitor.  If outpatient monitor shows only atrial flutter, then it makes sense to refer him for ablation to eliminate the need for long-term anticoagulation.  Continue metoprolol and Eliquis.  2.  Chest pain: Concerning for angina given that the episodes are happening in the setting of tachycardia and also with physical exertion.  In addition, he is diabetic with hyperlipidemia.  I requested cardiac CTA for evaluation.  3.  Hyperlipidemia: Currently on rosuvastatin 5 mg daily.  Most recent lipid profile showed an LDL of 53.  4.  Essential hypertension:  Previously on lisinopril but currently on metoprolol and blood pressure is on the low side.    Disposition:   FU in 6 months  Signed,  Lorine Bears, MD  02/22/2023 8:28 AM    [facility]

## 2023-02-22 ENCOUNTER — Encounter: Payer: Self-pay | Admitting: Cardiovascular Disease

## 2023-02-22 ENCOUNTER — Ambulatory Visit: Payer: BC Managed Care – PPO | Attending: Cardiovascular Disease | Admitting: Cardiovascular Disease

## 2023-02-22 ENCOUNTER — Ambulatory Visit: Payer: BC Managed Care – PPO

## 2023-02-22 VITALS — BP 100/70 | HR 79 | Ht 74.0 in | Wt 262.1 lb

## 2023-02-22 DIAGNOSIS — E785 Hyperlipidemia, unspecified: Secondary | ICD-10-CM

## 2023-02-22 DIAGNOSIS — I4892 Unspecified atrial flutter: Secondary | ICD-10-CM | POA: Diagnosis not present

## 2023-02-22 DIAGNOSIS — R072 Precordial pain: Secondary | ICD-10-CM | POA: Diagnosis not present

## 2023-02-22 DIAGNOSIS — I10 Essential (primary) hypertension: Secondary | ICD-10-CM

## 2023-02-22 NOTE — Patient Instructions (Addendum)
Medication Instructions:  No changes *If you need a refill on your cardiac medications before your next appointment, please call your pharmacy*   Lab Work: None ordered  If you have labs (blood work) drawn today and your tests are completely normal, you will receive your results only by: MyChart Message (if you have MyChart) OR A paper copy in the mail If you have any lab test that is abnormal or we need to change your treatment, we will call you to review the results.   Testing/Procedures: See below   Follow-Up: At [facility], you and your health needs are our priority.  As part of our continuing mission to provide you with exceptional heart care, we have created designated Provider Care Teams.  These Care Teams include your primary Cardiologist (physician) and Advanced Practice Providers (APPs -  Physician Assistants and Nurse Practitioners) who all work together to provide you with the care you need, when you need it.  We recommend signing up for the patient portal called "MyChart".  Sign up information is provided on this After Visit Summary.  MyChart is used to connect with patients for Virtual Visits (Telemedicine).  Patients are able to view lab/test results, encounter notes, upcoming appointments, etc.  Non-urgent messages can be sent to your provider as well.   To learn more about what you can do with MyChart, go to [email or web address].    Your next appointment:   6 month(s)  Provider:   You may see Lorine Bears, MD or one of the following Advanced Practice Providers on your designated Care Team:   Nicolasa Ducking, NP Eula Listen, PA-C Cadence Fransico Jamiah, PA-C Charlsie Quest, NP    Other Instructions   Your cardiac CT will be scheduled at one of the below locations:   [facility] [address] [place], [place] [address] [phone]  OR  [facility] [address] [place], [place]  [address] [phone]  OR   [facility] [address] [place], [place] [address] [phone]  If scheduled at [facility], please arrive at the [facility] and Children's Entrance (Entrance C2) of [facility] 30 minutes prior to test start time. You can use the FREE valet parking offered at entrance C (encouraged to control the heart rate for the test)  Proceed to the [facility] Rad[facility] Department (first floor) to check-in and test prep.  All radiology patients and guests should use entrance C2 at [facility], accessed from [facility], even though the hospital's physical address listed is [address].    If scheduled at [facility] or [facility], please arrive 15 mins early for check-in and test prep.   Please follow these instructions carefully (unless otherwise directed):  Hold all erectile dysfunction medications at least 3 days (72 hrs) prior to test. (Ie viagra, cialis, sildenafil, tadalafil, etc) We will administer nitroglycerin during this exam.   On the Night Before the Test: Be sure to Drink plenty of water. Do not consume any caffeinated/decaffeinated beverages or chocolate 12 hours prior to your test. Do not take any antihistamines 12 hours prior to your test.  On the Day of the Test: Drink plenty of water until 1 hour prior to the test. Do not eat any food 1 hour prior to test. You may take your regular medications prior to the test.  Take 50 mg metoprolol (Lopressor) two hours prior to test.  After the Test: Drink plenty of water. After receiving IV contrast, you may experience a mild flushed feeling. This is normal. On occasion, you may experience a mild rash up to 24 hours after the test. This is not dangerous. If this occurs, you can take Benadryl 25 mg and increase your fluid intake. If you experience trouble breathing, this can be serious.  If it is severe call 911 IMMEDIATELY. If it is mild, please call our office. If you take any of these medications: Glipizide/Metformin, Avandament, Glucavance, please do not take 48 hours after completing test unless otherwise instructed.  We will call to schedule your test 2-4 weeks out understanding that some insurance companies will need an authorization prior to the service being performed.   For non-scheduling related questions, please contact the cardiac imaging nurse navigator should you have any questions/concerns: Rockwell Alexandria, Cardiac Imaging Nurse Navigator Larey Brick, Cardiac Imaging Nurse Navigator [facility] Direct Office Dial: [phone]   For scheduling needs, including cancellations and rescheduling, please call [place], [phone].    ZIO XT- Long Term Monitor Instructions  Your physician has requested you wear a ZIO patch monitor for 14 days.  This is a single patch monitor. [employer] supplies one patch monitor per enrollment. Additional stickers are not available. Please do not apply patch if you will be having a Nuclear Stress Test,  Echocardiogram, Cardiac CT, MRI, or Chest Xray during the period you would be wearing the  monitor. The patch cannot be worn during these tests. You cannot remove and re-apply the  ZIO XT patch monitor.  Your ZIO patch monitor will be mailed 3 day USPS to your address on file. It may take 3-5 days  to receive your monitor after you have been enrolled.  Once you have received your monitor, please review the enclosed instructions. Your monitor  has already been registered assigning a specific monitor serial # to you.  Billing and Patient Assistance Program Information  We have supplied [employer] with any of your insurance information on file for billing purposes. [employer] offers a sliding scale Patient Assistance Program for patients that do not have  insurance, or whose insurance does not completely  cover the cost of the ZIO monitor.  You must apply for the Patient Assistance Program to qualify for this discounted rate.  To apply, please call [employer] at [phone], select option 4, select option 2, ask to apply for  Patient Assistance Program. Meredeth Ide will ask your household income, and how many people  are in your household. They will quote your out-of-pocket cost based on that information.  [employer] will also be able to set up a 68-month, interest-free payment plan if needed.  Applying the monitor   Shave hair from upper left chest.  Hold abrader disc by orange tab. Rub abrader in 40 strokes over the upper left chest as  indicated in your monitor instructions.  Clean area with 4 enclosed alcohol pads. Let dry.  Apply patch as indicated in monitor instructions. Patch will be placed under collarbone on left  side of chest with arrow pointing upward.  Rub patch adhesive wings for 2 minutes. Remove white label marked "1". Remove the white  label marked "2". Rub patch adhesive wings for 2 additional minutes.  While looking in a mirror, press and release button in center of patch. A small green light will  flash 3-4 times. This will be your only indicator that the monitor has been turned on.  Do not shower  for the first 24 hours. You may shower after the first 24 hours.  Press the button if you feel a symptom. You will hear a small click. Record Date, Time and  Symptom in the Patient Logbook.  When you are ready to remove the patch, follow instructions on the last 2 pages of Patient  Logbook. Stick patch monitor onto the last page of Patient Logbook.  Place Patient Logbook in the blue and white box. Use locking tab on box and tape box closed  securely. The blue and white box has prepaid postage on it. Please place it in the mailbox as  soon as possible. Your physician should have your test results approximately 7 days after the  monitor has been mailed back to [facility].  Call [facility] Customer Care at [phone] if you have questions regarding  your ZIO XT patch monitor. Call them immediately if you see an orange light blinking on your  monitor.  If your monitor falls off in less than 4 days, contact our Monitor department at [phone].  If your monitor becomes loose or falls off after 4 days call [employer] at [phone] for  suggestions on securing your monitor

## 2023-02-23 ENCOUNTER — Ambulatory Visit: Payer: BC Managed Care – PPO | Admitting: Cardiovascular Disease

## 2023-02-27 DIAGNOSIS — I4892 Unspecified atrial flutter: Secondary | ICD-10-CM | POA: Diagnosis not present

## 2023-03-08 ENCOUNTER — Telehealth (HOSPITAL_COMMUNITY): Payer: Self-pay | Admitting: *Deleted

## 2023-03-08 ENCOUNTER — Encounter (HOSPITAL_COMMUNITY): Payer: Self-pay

## 2023-03-08 ENCOUNTER — Other Ambulatory Visit
Admission: RE | Admit: 2023-03-08 | Discharge: 2023-03-08 | Disposition: A | Discharge status: Home / Self Care | Payer: BC Managed Care – PPO | Attending: Cardiovascular Disease | Admitting: Cardiovascular Disease

## 2023-03-08 DIAGNOSIS — R072 Precordial pain: Secondary | ICD-10-CM | POA: Diagnosis not present

## 2023-03-08 LAB — BASIC METABOLIC PANEL
Anion gap: 12 (ref 5–15)
BUN: 16 mg/dL (ref 8–23)
CO2: 20 mmol/L — ABNORMAL LOW (ref 22–32)
Calcium: 9.5 mg/dL (ref 8.9–10.3)
Chloride: 107 mmol/L (ref 98–111)
Creatinine, Ser: 0.99 mg/dL (ref 0.61–1.24)
GFR, Estimated: >60 mL/min (ref >60)
Glucose, Bld: 118 mg/dL — ABNORMAL HIGH (ref 70–99)
Potassium: 4.1 mmol/L (ref 3.5–5.1)
Sodium: 139 mmol/L (ref 135–145)

## 2023-03-08 NOTE — Telephone Encounter (Signed)
Patient returning call about his upcoming cardiac imaging study; pt verbalizes understanding of appt date/time, parking situation and where to check in, pre-test NPO status and medications ordered, and verified current allergies; name and call back number provided for further questions should they arise  Larey Brick RN Navigator Cardiac Imaging [facility] [phone] office [phone] cell  Patient aware to obtain labs and to take 50mg  metoprolol tartrate two hours prior to his cardiac CT scan.

## 2023-03-08 NOTE — Telephone Encounter (Signed)
Attempted to call patient regarding upcoming cardiac CT appointment. °Left message on voicemail with name and callback number ° °Mckynzie Liwanag RN Navigator Cardiac Imaging °[facility] °[phone] Office °[phone] Cell ° °

## 2023-03-12 ENCOUNTER — Ambulatory Visit
Admission: RE | Admit: 2023-03-12 | Discharge: 2023-03-12 | Disposition: A | Discharge status: Home / Self Care | Payer: BC Managed Care – PPO | Source: Ambulatory Visit | Attending: Cardiovascular Disease | Admitting: Cardiovascular Disease

## 2023-03-12 DIAGNOSIS — R072 Precordial pain: Secondary | ICD-10-CM

## 2023-03-12 MED ORDER — IOHEXOL 350 MG/ML SOLN: 100.0000 mL | Freq: Once | INTRAVENOUS | Status: DC | PRN

## 2023-03-13 ENCOUNTER — Other Ambulatory Visit (HOSPITAL_COMMUNITY): Payer: Self-pay | Admitting: Emergency Medicine

## 2023-03-13 MED ORDER — IVABRADINE HCL 5 MG PO TABS: 15.0000 mg | ORAL_TABLET | Freq: Once | ORAL | 0 refills | Status: AC

## 2023-03-19 ENCOUNTER — Other Ambulatory Visit: Payer: Self-pay | Admitting: Cardiovascular Disease

## 2023-03-20 ENCOUNTER — Other Ambulatory Visit (HOSPITAL_COMMUNITY): Payer: Self-pay | Admitting: *Deleted

## 2023-03-20 MED ORDER — IVABRADINE HCL 7.5 MG PO TABS: ORAL_TABLET | ORAL | 0 refills | Status: DC

## 2023-03-23 ENCOUNTER — Telehealth (HOSPITAL_COMMUNITY): Payer: Self-pay | Admitting: *Deleted

## 2023-03-23 ENCOUNTER — Other Ambulatory Visit: Payer: Self-pay

## 2023-03-23 DIAGNOSIS — E118 Type 2 diabetes mellitus with unspecified complications: Secondary | ICD-10-CM

## 2023-03-23 DIAGNOSIS — I4892 Unspecified atrial flutter: Secondary | ICD-10-CM | POA: Diagnosis not present

## 2023-03-23 NOTE — Telephone Encounter (Signed)
Attempted to call patient regarding upcoming cardiac CT appointment. °Left message on voicemail with name and callback number ° °Arnetia Bronk RN Navigator Cardiac Imaging ° [facility] °[phone] Office °[phone] Cell ° °

## 2023-03-23 NOTE — Telephone Encounter (Signed)
Reaching out to patient to offer assistance regarding upcoming cardiac imaging study; pt verbalizes understanding of appt date/time, parking situation and where to check in, pre-test NPO status and medications ordered, and verified current allergies; name and call back number provided for further questions should they arise  Dustin Brick RN Navigator Cardiac Imaging [facility] [phone] office [phone] cell  Patient to take 50mg  metoprolol tartate and 15mg  ivabradine two hours prior to his cardiac CT scan.

## 2023-03-26 ENCOUNTER — Ambulatory Visit
Admission: RE | Admit: 2023-03-26 | Discharge: 2023-03-26 | Disposition: A | Discharge status: Home / Self Care | Payer: BC Managed Care – PPO | Source: Ambulatory Visit | Attending: Cardiovascular Disease | Admitting: Cardiovascular Disease

## 2023-03-26 ENCOUNTER — Other Ambulatory Visit: Payer: BC Managed Care – PPO

## 2023-03-26 ENCOUNTER — Other Ambulatory Visit: Payer: Self-pay

## 2023-03-26 DIAGNOSIS — E118 Type 2 diabetes mellitus with unspecified complications: Secondary | ICD-10-CM

## 2023-03-26 DIAGNOSIS — R072 Precordial pain: Secondary | ICD-10-CM | POA: Diagnosis not present

## 2023-03-26 MED ORDER — IOHEXOL 350 MG/ML SOLN
100.0000 mL | Freq: Once | INTRAVENOUS | Status: AC | PRN
  Administered 2023-03-26: 100 mL via INTRAVENOUS

## 2023-03-26 MED ORDER — NITROGLYCERIN 0.4 MG SL SUBL
0.8000 mg | SUBLINGUAL_TABLET | Freq: Once | SUBLINGUAL | Status: AC
  Administered 2023-03-26: 0.4 mg via SUBLINGUAL
  Filled 2023-03-26: qty 25

## 2023-03-26 MED ORDER — IOHEXOL 300 MG/ML  SOLN: 100.0000 mL | Freq: Once | INTRAMUSCULAR | Status: DC | PRN

## 2023-03-26 MED ORDER — DILTIAZEM HCL 25 MG/5ML IV SOLN
10.0000 mg | Freq: Once | INTRAVENOUS | Status: DC
  Filled 2023-03-26: qty 5

## 2023-03-26 NOTE — Progress Notes (Signed)

## 2023-03-27 LAB — COMPREHENSIVE METABOLIC PANEL

## 2023-03-28 ENCOUNTER — Other Ambulatory Visit (HOSPITAL_COMMUNITY): Payer: Self-pay

## 2023-03-28 LAB — COMPREHENSIVE METABOLIC PANEL
ALT: 20 IU/L (ref 0–44)
AST: 17 IU/L (ref 0–40)
Alkaline Phosphatase: 74 IU/L (ref 44–121)
BUN/Creatinine Ratio: 16 (ref 10–24)
BUN: 15 mg/dL (ref 8–27)
Bilirubin Total: 0.3 mg/dL (ref 0.0–1.2)
CO2: 18 mmol/L — ABNORMAL LOW (ref 20–29)
Calcium: 9.7 mg/dL (ref 8.6–10.2)
Chloride: 105 mmol/L (ref 96–106)
Creatinine, Ser: 0.91 mg/dL (ref 0.76–1.27)
Globulin, Total: 2.3 g/dL (ref 1.5–4.5)
Sodium: 138 mmol/L (ref 134–144)
eGFR: 96 mL/min/{1.73_m2} (ref >59)

## 2023-03-28 LAB — HEMOGLOBIN A1C
Est. average glucose Bld gHb Est-mCnc: 151 mg/dL
Hgb A1c MFr Bld: 6.9 % — ABNORMAL HIGH (ref 4.8–5.6)

## 2023-04-02 ENCOUNTER — Ambulatory Visit: Payer: BC Managed Care – PPO | Admitting: Family

## 2023-04-02 ENCOUNTER — Encounter: Payer: Self-pay | Admitting: Family

## 2023-04-02 VITALS — BP 116/76 | HR 96 | Temp 97.8°F | Ht 74.0 in | Wt 262.0 lb

## 2023-04-02 DIAGNOSIS — I10 Essential (primary) hypertension: Secondary | ICD-10-CM | POA: Diagnosis not present

## 2023-04-02 DIAGNOSIS — Z7984 Long term (current) use of oral hypoglycemic drugs: Secondary | ICD-10-CM | POA: Diagnosis not present

## 2023-04-02 DIAGNOSIS — E118 Type 2 diabetes mellitus with unspecified complications: Secondary | ICD-10-CM

## 2023-04-02 NOTE — Progress Notes (Signed)
Assessment & Plan:  DM type 2, controlled, with complication [facility]) Assessment & Plan:  Lab Results  Component Value Date   HGBA1C 6.9 (H) 03/26/2023  Improved from prior.  Discussed low glycemic diet. Continue dapagliflozin - metformin 5-1000mg  two tablets QD , rybelsus 14 mg qd .   Orders: -     Hemoglobin A1c -     Comprehensive metabolic panel  Primary hypertension Assessment & Plan: Chronic, stable. Continue metoprolol tartrate 25 mg bid. Will continue consider ARB for renal protection setting of DM      Return precautions given.   Risks, benefits, and alternatives of the medications and treatment plan prescribed today were discussed, and patient expressed understanding.   Education regarding symptom management and diagnosis given to patient on AVS either electronically or printed.  Return in about 3 months (around 07/03/2023).  Rennie Plowman, [name]  Subjective:    Patient ID: Dustin Fletcher, male    DOB: [birth year], 62 y.o.   MRN: [id number]  CC: Dustin Fletcher is a 62 y.o. male who presents today for follow up.   HPI: Feels well today  No new complaints  He is working on eating healthier and following heart healthy diet   He is compliant with dapagliflozin - metformin 5-1000mg  two tablets QD , rybelsus 14 mg qd .      Allergies: Patient has no known allergies. Current Outpatient Medications on File Prior to Visit  Medication Sig Dispense Refill   apixaban (ELIQUIS) 5 MG TABS tablet TAKE 1 TABLET BY MOUTH TWICE A DAY 60 tablet 5   blood glucose meter kit and supplies KIT Dispense based on patient and insurance preference. Use up to four times daily as directed. (FOR ICD-9 250.00, 250.01). 1 each 0   Cholecalciferol (VITAMIN D3) 2000 UNITS TABS Take 4,000 Units by mouth 2 (two) times daily.     Chromium Picolinate 500 MCG CAPS Take by mouth. Pt takes one 500 mg tablet daily     Dapagliflozin Pro-metFORMIN ER (XIGDUO XR) 01-999 MG TB24 Take 2  tablets by mouth every morning. 180 tablet 3   ELDERBERRY PO Take 1 tablet by mouth daily.     ibuprofen (ADVIL,MOTRIN) 800 MG tablet Take 800 mg by mouth 2 (two) times daily as needed for fever, headache or mild pain.     ivabradine (CORLANOR) 7.5 MG TABS tablet Take tablets (15mg ) TWO hours prior to your cardiac CT scan. 2 tablet 0   metoprolol tartrate (LOPRESSOR) 25 MG tablet TAKE 1 TABLET BY MOUTH 2 TIMES A DAY 60 tablet 4   NON FORMULARY Berberine Ceylon Cinnamon Take 500 mg twice daily.     Omega-3 Fatty Acids (FISH OIL) 1000 MG CAPS Take 2,000 mg by mouth every morning. And takes 100 mg in the evening     rosuvastatin (CRESTOR) 5 MG tablet Take 1 tablet (5 mg total) by mouth daily. 90 tablet 3   [name] 14 MG TABS TAKE 1 TABLET BY MOUTH DAILY 90 tablet 1   vitamin B-12 (CYANOCOBALAMIN) 1000 MCG tablet Take 1,000 mcg by mouth daily.     No current facility-administered medications on file prior to visit.    Review of Systems  Constitutional:  Negative for chills and fever.  Respiratory:  Negative for cough.   Cardiovascular:  Negative for chest pain and palpitations.  Gastrointestinal:  Negative for nausea and vomiting.      Objective:    BP 116/76   Pulse 96   Temp 97.8  F (36.6 C) (Oral)   Ht 6\' 2"  (1.88 m)   Wt 262 lb (118.8 kg)   SpO2 97%   BMI 33.64 kg/m  BP Readings from Last 3 Encounters:  04/02/23 116/76  03/26/23 105/68  02/22/23 100/70   Wt Readings from Last 3 Encounters:  04/02/23 262 lb (118.8 kg)  02/22/23 262 lb 2 oz (118.9 kg)  01/01/23 267 lb 12.8 oz (121.5 kg)    Physical Exam Vitals reviewed.  Constitutional:      Appearance: He is well-developed.  Cardiovascular:     Rate and Rhythm: Regular rhythm.     Heart sounds: Normal heart sounds.  Pulmonary:     Effort: Pulmonary effort is normal. No respiratory distress.     Breath sounds: Normal breath sounds. No wheezing, rhonchi or rales.  Skin:    General: Skin is warm and dry.   Neurological:     Mental Status: He is alert.  Psychiatric:        Speech: Speech normal.        Behavior: Behavior normal.

## 2023-04-02 NOTE — Assessment & Plan Note (Signed)
Chronic, stable. Continue metoprolol tartrate 25 mg bid. Will continue consider ARB for renal protection setting of DM

## 2023-04-02 NOTE — Assessment & Plan Note (Signed)
  Lab Results  Component Value Date   HGBA1C 6.9 (H) 03/26/2023  Improved from prior.  Discussed low glycemic diet. Continue dapagliflozin - metformin 5-1000mg  two tablets QD , rybelsus 14 mg qd .

## 2023-04-05 ENCOUNTER — Other Ambulatory Visit: Payer: Self-pay | Admitting: *Deleted

## 2023-04-05 DIAGNOSIS — I4892 Unspecified atrial flutter: Secondary | ICD-10-CM

## 2023-05-11 ENCOUNTER — Other Ambulatory Visit: Payer: Self-pay | Admitting: Cardiovascular Disease

## 2023-05-11 NOTE — Telephone Encounter (Signed)
Prescription refill request for Eliquis received. Indication:aflutter Last office visit:6/24 Scr:0.91  7/24 Age: 62 Weight:118.8  kg  Prescription refilled

## 2023-05-11 NOTE — Telephone Encounter (Signed)
Refill request

## 2023-06-05 NOTE — Progress Notes (Unsigned)
Electrophysiology Office Note:    Date:  06/06/2023   ID:  Dustin Fletcher, DOB 06-04-1961, MRN [id number]  [facility] Cardiologist:  Lorine Bears, MD  [facility] Electrophysiologist:  None   Referring MD: Iran Ouch, MD   Chief Complaint: Atrial flutter  History of Present Illness:    Dustin Fletcher is a 62 y.o. malewho I am seeing today for an evaluation of atrial flutter at the request of Dr. Kirke Corin.  The patient was last seen by Dr. Kirke Corin on February 22, 2023.  The patient has a medical history that includes atrial flutter, diabetes, hypertension, hyperlipidemia.  He was diagnosed with atrial flutter in July 2023.  He was started on Eliquis.  He spontaneously converted back to sinus rhythm on subsequent EKG.  At the appointment with Dr. Kirke Corin on June 13, he reported intermittent episodes of palpitations.  A ZIO monitor was ordered and he was referred for consideration of atrial flutter ablation.  He can tell when he is out of rhythm when his heart rates are elevated.  He does not have any symptoms of heart failure, no fluid accumulation.  No lightheadedness or dizziness.  He is interested in avoiding long-term use of medications and Eliquis if at all possible.  He is interested in pursuing catheter ablation as a mechanism to avoid long-term Eliquis use.  His brother has atrial fibrillation and has had an ablation.        Their past medical, social and family history was reveiwed.   ROS:   Please see the history of present illness.    All other systems reviewed and are negative.  EKGs/Labs/Other Studies Reviewed:    The following studies were reviewed today:  March 29, 2023 ZIO monitor personally reviewed Heart rate 31-2 14, average 86 Less than 1% burden of atrial flutter, average 147 bpm Symptom triggered episodes corresponded atrial flutter Review of rhythm strip shows only atrial flutter.  I do not see any clear evidence of atrial  fibrillation.  April 04, 2022 EKG shows atrial flutter       Physical Exam:    VS:  BP 104/60   Pulse 95   Ht 6\' 2"  (1.88 m)   Wt 266 lb 12.8 oz (121 kg)   SpO2 96%   BMI 34.26 kg/m     Wt Readings from Last 3 Encounters:  06/06/23 266 lb 12.8 oz (121 kg)  04/02/23 262 lb (118.8 kg)  02/22/23 262 lb 2 oz (118.9 kg)     GEN:  Well nourished, well developed in no acute distress.  Obese CARDIAC: Irregularly irregular, no murmurs, rubs, gallops RESPIRATORY:  Clear to auscultation without rales, wheezing or rhonchi       ASSESSMENT AND PLAN:    1. Atrial flutter, unspecified type (HCC)   2. Primary hypertension     #Paroxysmal atrial flutter The patient has a history of symptomatic paroxysmal atrial flutter.  I have not seen any clear evidence of atrial fibrillation.  I discussed the treatment options for the patient during today's clinic appointment including continued conservative management, antiarrhythmic drugs and catheter ablation.  Discussed treatment options today for atrial flutter including antiarrhythmic drug therapy and ablation. Discussed risks, recovery and likelihood of success with each treatment strategy. Risk, benefits, and alternatives to EP study and ablation for afib were discussed. These risks include but are not limited to stroke, bleeding, vascular damage, tamponade, perforation, worsening renal function, coronary vasospasm and death.  Discussed potential need for repeat ablation  procedures and antiarrhythmic drugs after an initial ablation. The patient understands these risk and wishes to proceed.  We will therefore proceed with catheter ablation at the next available time.  ICE and anesthesia are requested for the procedure.    Will plan to implant loop recorder at the same time as the atrial flutter ablation for ongoing atrial fibrillation surveillance.  We discussed the possibility that he would develop atrial fibrillation after a successful atrial  flutter ablation.  He will continue his Eliquis for stroke prophylaxis  #Hypertension At goal today.  Recommend checking blood pressures 1-2 times per week at home and recording the values.  Recommend bringing these recordings to the primary care physician.      Signed, Rossie Muskrat. Lalla Brothers, MD, [facility], [facility] 06/06/2023 3:26 PM    Electrophysiology [facility]

## 2023-06-06 ENCOUNTER — Ambulatory Visit: Payer: BC Managed Care – PPO | Attending: Cardiology | Admitting: Cardiology

## 2023-06-06 ENCOUNTER — Other Ambulatory Visit: Payer: Self-pay

## 2023-06-06 ENCOUNTER — Encounter: Payer: Self-pay | Admitting: Cardiology

## 2023-06-06 VITALS — BP 104/60 | HR 95 | Ht 74.0 in | Wt 266.8 lb

## 2023-06-06 DIAGNOSIS — I4892 Unspecified atrial flutter: Secondary | ICD-10-CM

## 2023-06-06 DIAGNOSIS — I10 Essential (primary) hypertension: Secondary | ICD-10-CM

## 2023-06-06 NOTE — Patient Instructions (Addendum)
Medication Instructions:  Your physician recommends that you continue on your current medications as directed. Please refer to the Current Medication list given to you today.  *If you need a refill on your cardiac medications before your next appointment, please call your pharmacy*   Lab Work: BMET and CBC prior to ablation  Please go to [facility] [address] ([facility], [place] [address] You do not need an appointment.  They are open from 7:30 am-4 pm.  Lunch from 1:00 pm- 2:00 pm You do NOT need to be fasting.  You may also go to any of these [facility] locations:  [employer]  - [address] [employer] [address] Chief Technology Officer)  Testing/Procedures: Your physician has recommended that you have an ablation. Catheter ablation is a medical procedure used to treat some cardiac arrhythmias (irregular heartbeats). During catheter ablation, a long, thin, flexible tube is put into a blood vessel in your groin (upper thigh), or neck. This tube is called an ablation catheter. It is then guided to your heart through the blood vessel. Radio frequency waves destroy small areas of heart tissue where abnormal heartbeats may cause an arrhythmia to start. Please see the instruction sheet given to you today. You are scheduled for  Atrial Flutter Ablation  on Friday, December 6 with Dr. Steffanie Dunn.Please arrive at the Main Entrance A at [facility]: [address] [place], [place] [address] at 11:30 AM   Follow-Up: At [facility], you and your health needs are our priority.  As part of our continuing mission to provide you with exceptional heart care, we have created designated Provider Care Teams.  These Care Teams include your primary Cardiologist (physician) and Advanced Practice Providers (APPs -  Physician Assistants and Nurse Practitioners) who all work together to provide you with the care you need, when you need it.    Your next  appointment:   4 weeks after your ablation

## 2023-06-08 ENCOUNTER — Ambulatory Visit (INDEPENDENT_AMBULATORY_CARE_PROVIDER_SITE_OTHER): Payer: Self-pay | Admitting: Physician Assistant

## 2023-06-08 VITALS — BP 114/72

## 2023-06-08 DIAGNOSIS — Z0130 Encounter for examination of blood pressure without abnormal findings: Secondary | ICD-10-CM

## 2023-06-08 NOTE — Progress Notes (Signed)
62 y/o M presents to the clinic for BP check.

## 2023-06-15 ENCOUNTER — Other Ambulatory Visit: Payer: Self-pay | Admitting: Cardiovascular Disease

## 2023-06-28 ENCOUNTER — Other Ambulatory Visit: Payer: BC Managed Care – PPO

## 2023-06-28 DIAGNOSIS — E118 Type 2 diabetes mellitus with unspecified complications: Secondary | ICD-10-CM

## 2023-06-30 LAB — COMPREHENSIVE METABOLIC PANEL
ALT: 24 IU/L (ref 0–44)
AST: 17 IU/L (ref 0–40)
Albumin: 4.6 g/dL (ref 3.9–4.9)
Alkaline Phosphatase: 78 IU/L (ref 44–121)
BUN/Creatinine Ratio: 18 (ref 10–24)
BUN: 16 mg/dL (ref 8–27)
CO2: 17 mmol/L — ABNORMAL LOW (ref 20–29)
Calcium: 9.8 mg/dL (ref 8.6–10.2)
Chloride: 106 mmol/L (ref 96–106)
Creatinine, Ser: 0.88 mg/dL (ref 0.76–1.27)
Globulin, Total: 2.4 g/dL (ref 1.5–4.5)
Glucose: 140 mg/dL — ABNORMAL HIGH (ref 70–99)
Potassium: 4.5 mmol/L (ref 3.5–5.2)
Sodium: 144 mmol/L (ref 134–144)
Total Protein: 7 g/dL (ref 6.0–8.5)
eGFR: 97 mL/min/{1.73_m2} (ref >59)

## 2023-06-30 LAB — HEMOGLOBIN A1C
Est. average glucose Bld gHb Est-mCnc: 148 mg/dL
Hgb A1c MFr Bld: 6.8 % — ABNORMAL HIGH (ref 4.8–5.6)

## 2023-07-05 ENCOUNTER — Encounter: Payer: Self-pay | Admitting: Family

## 2023-07-05 ENCOUNTER — Ambulatory Visit: Payer: BC Managed Care – PPO | Admitting: Family

## 2023-07-05 ENCOUNTER — Telehealth: Payer: Self-pay | Admitting: Family

## 2023-07-05 ENCOUNTER — Other Ambulatory Visit: Payer: Self-pay | Admitting: Cardiovascular Disease

## 2023-07-05 VITALS — BP 118/70 | HR 88 | Temp 97.7°F | Ht 74.0 in | Wt 262.2 lb

## 2023-07-05 DIAGNOSIS — Z7984 Long term (current) use of oral hypoglycemic drugs: Secondary | ICD-10-CM

## 2023-07-05 DIAGNOSIS — Z125 Encounter for screening for malignant neoplasm of prostate: Secondary | ICD-10-CM

## 2023-07-05 DIAGNOSIS — I10 Essential (primary) hypertension: Secondary | ICD-10-CM | POA: Diagnosis not present

## 2023-07-05 DIAGNOSIS — Z13220 Encounter for screening for lipoid disorders: Secondary | ICD-10-CM

## 2023-07-05 DIAGNOSIS — E118 Type 2 diabetes mellitus with unspecified complications: Secondary | ICD-10-CM | POA: Diagnosis not present

## 2023-07-05 DIAGNOSIS — E785 Hyperlipidemia, unspecified: Secondary | ICD-10-CM | POA: Diagnosis not present

## 2023-07-05 NOTE — Assessment & Plan Note (Signed)
Lab Results  Component Value Date   LDLCALC 53 09/19/2022   Chronic, stable. Continue crestor 5mg  every day.

## 2023-07-05 NOTE — Progress Notes (Signed)
Assessment & Plan:  DM type 2, controlled, with complication [facility]) Assessment & Plan:  Lab Results  Component Value Date   HGBA1C 6.8 (H) 06/28/2023  Improved from prior.   Continue dapagliflozin - metformin 5-1000mg  two tablets QD , rybelsus 14 mg qd .   Orders: -     Hemoglobin A1c -     Lipid panel -     VITAMIN D 25 Hydroxy (Vit-D Deficiency, Fractures)  Primary hypertension  Screening for prostate cancer -     PSA  Encounter for lipid screening for cardiovascular disease -     Lipid panel  Hyperlipidemia, unspecified hyperlipidemia type Assessment & Plan: Lab Results  Component Value Date   LDLCALC 53 09/19/2022   Chronic, stable. Continue crestor 5mg  every day.       Return precautions given.   Risks, benefits, and alternatives of the medications and treatment plan prescribed today were discussed, and patient expressed understanding.   Education regarding symptom management and diagnosis given to patient on AVS either electronically or printed.  Return in about 3 months (around 10/05/2023).  Rennie Plowman, [name]  Subjective:    Patient ID: Dustin Fletcher, male    DOB: [birth year], 62 y.o.   MRN: [id number]  CC: Dustin Fletcher is a 62 y.o. male who presents today for follow up.   HPI: Feels well today.  No new complaints    Follow-up Dr. Lalla Brothers 06/06/2023 for atrial flutter.  Planning to implant loop recorder, atrial flutter ablation for next month.  He is compliant with Eliquis 5 mg   Allergies: Patient has no known allergies. Current Outpatient Medications on File Prior to Visit  Medication Sig Dispense Refill   blood glucose meter kit and supplies KIT Dispense based on patient and insurance preference. Use up to four times daily as directed. (FOR ICD-9 250.00, 250.01). 1 each 0   Cholecalciferol (VITAMIN D3) 2000 UNITS TABS Take 4,000 Units by mouth 2 (two) times daily.     Dapagliflozin Pro-metFORMIN ER (XIGDUO XR) 01-999 MG TB24 Take 2  tablets by mouth every morning. 180 tablet 3   ELDERBERRY PO Take 1 tablet by mouth daily.     ELIQUIS 5 MG TABS tablet TAKE 1 TABLET BY MOUTH TWICE A DAY 60 tablet 5   ibuprofen (ADVIL,MOTRIN) 800 MG tablet Take 800 mg by mouth 2 (two) times daily as needed for fever, headache or mild pain.     metoprolol tartrate (LOPRESSOR) 25 MG tablet TAKE 1 TABLET BY MOUTH 2 TIMES A DAY 60 tablet 4   Omega-3 Fatty Acids (FISH OIL) 1000 MG CAPS Take 2,000 mg by mouth every morning. And takes 100 mg in the evening     rosuvastatin (CRESTOR) 5 MG tablet Take 1 tablet (5 mg total) by mouth daily. 90 tablet 3   [name] 14 MG TABS TAKE 1 TABLET BY MOUTH DAILY 90 tablet 1   vitamin B-12 (CYANOCOBALAMIN) 1000 MCG tablet Take 1,000 mcg by mouth daily.     No current facility-administered medications on file prior to visit.    Review of Systems  Constitutional:  Negative for chills and fever.  Respiratory:  Negative for cough.   Cardiovascular:  Negative for chest pain and palpitations.  Gastrointestinal:  Negative for nausea and vomiting.      Objective:    BP 118/70   Pulse 88   Temp 97.7 F (36.5 C) (Temporal)   Ht 6\' 2"  (1.88 m)   Wt 262 lb 3.2 oz (  118.9 kg)   SpO2 94%   BMI 33.66 kg/m  BP Readings from Last 3 Encounters:  07/05/23 118/70  06/08/23 114/72  06/06/23 104/60   Wt Readings from Last 3 Encounters:  07/05/23 262 lb 3.2 oz (118.9 kg)  06/06/23 266 lb 12.8 oz (121 kg)  04/02/23 262 lb (118.8 kg)    Physical Exam Vitals reviewed.  Constitutional:      Appearance: He is well-developed.  Cardiovascular:     Rate and Rhythm: Regular rhythm.     Heart sounds: Normal heart sounds.  Pulmonary:     Effort: Pulmonary effort is normal. No respiratory distress.     Breath sounds: Normal breath sounds. No wheezing, rhonchi or rales.  Skin:    General: Skin is warm and dry.  Neurological:     Mental Status: He is alert.  Psychiatric:        Speech: Speech normal.         Behavior: Behavior normal.

## 2023-07-05 NOTE — Telephone Encounter (Signed)
Left message for patient to call back  

## 2023-07-05 NOTE — Assessment & Plan Note (Signed)
  Lab Results  Component Value Date   HGBA1C 6.8 (H) 06/28/2023  Improved from prior.   Continue dapagliflozin - metformin 5-1000mg  two tablets QD , rybelsus 14 mg qd .

## 2023-07-05 NOTE — Telephone Encounter (Signed)
Dustin Fletcher ,  I am seeing [name] today and he had a question about if he should take amoxicillin 2000 mg prior to a flutter ablation, loop recorder in December.  He has a history of a right knee total replacement.  He takes amoxicillin prior to dental procedures.  I told him I did not think so but I wanted to double check with your office

## 2023-07-30 ENCOUNTER — Other Ambulatory Visit: Payer: Self-pay

## 2023-07-30 DIAGNOSIS — Z125 Encounter for screening for malignant neoplasm of prostate: Secondary | ICD-10-CM

## 2023-07-30 DIAGNOSIS — Z13220 Encounter for screening for lipoid disorders: Secondary | ICD-10-CM

## 2023-07-30 DIAGNOSIS — Z136 Encounter for screening for cardiovascular disorders: Secondary | ICD-10-CM

## 2023-07-30 DIAGNOSIS — E118 Type 2 diabetes mellitus with unspecified complications: Secondary | ICD-10-CM

## 2023-08-01 ENCOUNTER — Other Ambulatory Visit: Payer: BC Managed Care – PPO

## 2023-08-01 DIAGNOSIS — Z13220 Encounter for screening for lipoid disorders: Secondary | ICD-10-CM

## 2023-08-01 DIAGNOSIS — Z125 Encounter for screening for malignant neoplasm of prostate: Secondary | ICD-10-CM

## 2023-08-01 DIAGNOSIS — E118 Type 2 diabetes mellitus with unspecified complications: Secondary | ICD-10-CM

## 2023-08-02 LAB — HEMOGLOBIN A1C
Est. average glucose Bld gHb Est-mCnc: 151 mg/dL
Hgb A1c MFr Bld: 6.9 % — ABNORMAL HIGH (ref 4.8–5.6)

## 2023-08-02 LAB — LIPID PANEL
Chol/HDL Ratio: 3.3 ratio (ref 0.0–5.0)
Cholesterol, Total: 121 mg/dL (ref 100–199)
HDL: 37 mg/dL — ABNORMAL LOW (ref >39)
LDL Chol Calc (NIH): 56 mg/dL (ref 0–99)
Triglycerides: 163 mg/dL — ABNORMAL HIGH (ref 0–149)
VLDL Cholesterol Cal: 28 mg/dL (ref 5–40)

## 2023-08-02 LAB — PSA: Prostate Specific Ag, Serum: 0.2 ng/mL (ref 0.0–4.0)

## 2023-08-02 LAB — VITAMIN D 25 HYDROXY (VIT D DEFICIENCY, FRACTURES): Vit D, 25-Hydroxy: 45.4 ng/mL (ref 30.0–100.0)

## 2023-08-13 ENCOUNTER — Telehealth: Payer: Self-pay

## 2023-08-13 NOTE — Telephone Encounter (Signed)
-----   Message from Rennie Plowman sent at 08/13/2023  8:41 AM EST ----- Call patient Patient has not viewed MyChart result note.  Please review my chart note in detail with patient.   Please let me know if questions

## 2023-08-13 NOTE — Telephone Encounter (Signed)
Lvm for pt to give office a call back in regards to labs,     [place],   A1c is stable.  LDL remains excellent.     Regards, Claris Che

## 2023-08-14 ENCOUNTER — Other Ambulatory Visit: Payer: Self-pay | Admitting: Family

## 2023-08-14 DIAGNOSIS — E118 Type 2 diabetes mellitus with unspecified complications: Secondary | ICD-10-CM

## 2023-08-14 NOTE — Telephone Encounter (Signed)
Pt called back and I read the message and he verbalized understanding

## 2023-08-15 NOTE — Pre-Procedure Instructions (Signed)
Instructed patient on the following items: Arrival time 1000 Nothing to eat or drink after midnight No meds AM of procedure Responsible person to drive you home and stay with you for 24 hrs  Have you missed any doses of anti-coagulant Eliquis- takes twice a day, hasn't missed any doses.  Don't take Friday morning.

## 2023-08-17 ENCOUNTER — Ambulatory Visit (HOSPITAL_COMMUNITY): Payer: BC Managed Care – PPO | Admitting: Anesthesiology

## 2023-08-17 ENCOUNTER — Ambulatory Visit (HOSPITAL_COMMUNITY)
Admission: RE | Disposition: A | Discharge status: Home / Self Care | Payer: Self-pay | Source: Home / Self Care | Attending: Cardiology

## 2023-08-17 ENCOUNTER — Other Ambulatory Visit: Payer: Self-pay

## 2023-08-17 ENCOUNTER — Ambulatory Visit (HOSPITAL_COMMUNITY)
Admission: RE | Admit: 2023-08-17 | Discharge: 2023-08-17 | Disposition: A | Discharge status: Home / Self Care | Payer: BC Managed Care – PPO | Attending: Cardiology | Admitting: Cardiology

## 2023-08-17 ENCOUNTER — Encounter (HOSPITAL_COMMUNITY): Payer: Self-pay | Admitting: Cardiology

## 2023-08-17 DIAGNOSIS — E119 Type 2 diabetes mellitus without complications: Secondary | ICD-10-CM | POA: Insufficient documentation

## 2023-08-17 DIAGNOSIS — I10 Essential (primary) hypertension: Secondary | ICD-10-CM | POA: Diagnosis not present

## 2023-08-17 DIAGNOSIS — I4892 Unspecified atrial flutter: Secondary | ICD-10-CM | POA: Diagnosis not present

## 2023-08-17 DIAGNOSIS — Z7901 Long term (current) use of anticoagulants: Secondary | ICD-10-CM | POA: Diagnosis not present

## 2023-08-17 DIAGNOSIS — Z87891 Personal history of nicotine dependence: Secondary | ICD-10-CM | POA: Diagnosis not present

## 2023-08-17 DIAGNOSIS — E1140 Type 2 diabetes mellitus with diabetic neuropathy, unspecified: Secondary | ICD-10-CM | POA: Diagnosis not present

## 2023-08-17 DIAGNOSIS — E785 Hyperlipidemia, unspecified: Secondary | ICD-10-CM | POA: Insufficient documentation

## 2023-08-17 HISTORY — PX: A-FLUTTER ABLATION: EP1230

## 2023-08-17 HISTORY — PX: LOOP RECORDER INSERTION: EP1214

## 2023-08-17 LAB — BASIC METABOLIC PANEL
Anion gap: 9 (ref 5–15)
BUN: 14 mg/dL (ref 8–23)
CO2: 22 mmol/L (ref 22–32)
Calcium: 9.5 mg/dL (ref 8.9–10.3)
Chloride: 109 mmol/L (ref 98–111)
Creatinine, Ser: 0.83 mg/dL (ref 0.61–1.24)
GFR, Estimated: >60 mL/min (ref >60)
Glucose, Bld: 150 mg/dL — ABNORMAL HIGH (ref 70–99)
Potassium: 4 mmol/L (ref 3.5–5.1)
Sodium: 140 mmol/L (ref 135–145)

## 2023-08-17 LAB — CBC
HCT: 43.9 % (ref 39.0–52.0)
Hemoglobin: 14.4 g/dL (ref 13.0–17.0)
MCH: 28.2 pg (ref 26.0–34.0)
MCHC: 32.8 g/dL (ref 30.0–36.0)
MCV: 85.9 fL (ref 80.0–100.0)
Platelets: 182 10*3/uL (ref 150–400)
RBC: 5.11 MIL/uL (ref 4.22–5.81)
RDW: 13.4 % (ref 11.5–15.5)
WBC: 5.5 10*3/uL (ref 4.0–10.5)
nRBC: 0 % (ref 0.0–0.2)

## 2023-08-17 LAB — GLUCOSE, CAPILLARY
Glucose-Capillary: 155 mg/dL — ABNORMAL HIGH (ref 70–99)
Glucose-Capillary: 160 mg/dL — ABNORMAL HIGH (ref 70–99)

## 2023-08-17 SURGERY — A-FLUTTER ABLATION
Anesthesia: General

## 2023-08-17 MED ORDER — APIXABAN 5 MG PO TABS
5.0000 mg | ORAL_TABLET | Freq: Two times a day (BID) | ORAL | Status: DC
  Administered 2023-08-17: 5 mg via ORAL
  Filled 2023-08-17: qty 1

## 2023-08-17 MED ORDER — SODIUM CHLORIDE 0.9 % IV SOLN: 250.0000 mL | INTRAVENOUS | Status: DC | PRN

## 2023-08-17 MED ORDER — HEPARIN SODIUM (PORCINE) 1000 UNIT/ML IJ SOLN
INTRAMUSCULAR | Status: AC
  Filled 2023-08-17: qty 10

## 2023-08-17 MED ORDER — ROCURONIUM BROMIDE 10 MG/ML (PF) SYRINGE
PREFILLED_SYRINGE | INTRAVENOUS | Status: DC | PRN
  Administered 2023-08-17: 60 mg via INTRAVENOUS
  Administered 2023-08-17 (×2): 20 mg via INTRAVENOUS

## 2023-08-17 MED ORDER — PHENYLEPHRINE HCL-NACL 20-0.9 MG/250ML-% IV SOLN
INTRAVENOUS | Status: DC | PRN
  Administered 2023-08-17: 30 ug/min via INTRAVENOUS

## 2023-08-17 MED ORDER — ONDANSETRON HCL 4 MG/2ML IJ SOLN: 4.0000 mg | Freq: Four times a day (QID) | INTRAMUSCULAR | Status: DC | PRN

## 2023-08-17 MED ORDER — LIDOCAINE-EPINEPHRINE 1 %-1:100000 IJ SOLN
INTRAMUSCULAR | Status: AC
  Filled 2023-08-17: qty 1

## 2023-08-17 MED ORDER — HEPARIN (PORCINE) IN NACL 1000-0.9 UT/500ML-% IV SOLN
INTRAVENOUS | Status: DC | PRN
  Administered 2023-08-17: 500 mL

## 2023-08-17 MED ORDER — SODIUM CHLORIDE 0.9% FLUSH: 3.0000 mL | Freq: Two times a day (BID) | INTRAVENOUS | Status: DC

## 2023-08-17 MED ORDER — MIDAZOLAM HCL 2 MG/2ML IJ SOLN
INTRAMUSCULAR | Status: DC | PRN
  Administered 2023-08-17: 2 mg via INTRAVENOUS

## 2023-08-17 MED ORDER — SODIUM CHLORIDE 0.9 % IV SOLN: INTRAVENOUS | Status: DC

## 2023-08-17 MED ORDER — PHENYLEPHRINE 80 MCG/ML (10ML) SYRINGE FOR IV PUSH (FOR BLOOD PRESSURE SUPPORT)
PREFILLED_SYRINGE | INTRAVENOUS | Status: DC | PRN
  Administered 2023-08-17: 80 ug via INTRAVENOUS

## 2023-08-17 MED ORDER — ALBUMIN HUMAN 5 % IV SOLN: INTRAVENOUS | Status: DC | PRN

## 2023-08-17 MED ORDER — SODIUM CHLORIDE 0.9% FLUSH: 3.0000 mL | INTRAVENOUS | Status: DC | PRN

## 2023-08-17 MED ORDER — ONDANSETRON HCL 4 MG/2ML IJ SOLN
INTRAMUSCULAR | Status: DC | PRN
  Administered 2023-08-17: 4 mg via INTRAVENOUS

## 2023-08-17 MED ORDER — LIDOCAINE-EPINEPHRINE 1 %-1:100000 IJ SOLN
INTRAMUSCULAR | Status: DC | PRN
  Administered 2023-08-17: 20 mL

## 2023-08-17 MED ORDER — PROPOFOL 10 MG/ML IV BOLUS
INTRAVENOUS | Status: DC | PRN
  Administered 2023-08-17: 200 mg via INTRAVENOUS

## 2023-08-17 MED ORDER — ACETAMINOPHEN 325 MG PO TABS: 650.0000 mg | ORAL_TABLET | ORAL | Status: DC | PRN

## 2023-08-17 MED ORDER — HEPARIN SODIUM (PORCINE) 1000 UNIT/ML IJ SOLN
INTRAMUSCULAR | Status: DC | PRN
  Administered 2023-08-17: 1000 [IU] via INTRAVENOUS

## 2023-08-17 MED ORDER — SUGAMMADEX SODIUM 200 MG/2ML IV SOLN
INTRAVENOUS | Status: DC | PRN
  Administered 2023-08-17: 200 mg via INTRAVENOUS

## 2023-08-17 MED ORDER — MIDAZOLAM HCL 5 MG/5ML IJ SOLN
INTRAMUSCULAR | Status: AC
  Filled 2023-08-17: qty 5

## 2023-08-17 MED ORDER — FENTANYL CITRATE (PF) 250 MCG/5ML IJ SOLN
INTRAMUSCULAR | Status: DC | PRN
  Administered 2023-08-17 (×2): 50 ug via INTRAVENOUS

## 2023-08-17 MED ORDER — DEXAMETHASONE SODIUM PHOSPHATE 10 MG/ML IJ SOLN
INTRAMUSCULAR | Status: DC | PRN
  Administered 2023-08-17: 10 mg via INTRAVENOUS

## 2023-08-17 MED ORDER — FENTANYL CITRATE (PF) 100 MCG/2ML IJ SOLN
INTRAMUSCULAR | Status: AC
  Filled 2023-08-17: qty 2

## 2023-08-17 SURGICAL SUPPLY — 13 items
CATH SMTCH THERMOCOOL SF FJ (CATHETERS) IMPLANT
CATH SOUNDSTAR ECO 8FR (CATHETERS) IMPLANT
CATH WEBSTER BI DIR CS D-F CRV (CATHETERS) IMPLANT
CLOSURE PERCLOSE PROSTYLE (VASCULAR PRODUCTS) IMPLANT
MONITOR LUX-DX II+ M312 (Prosthesis & Implant Heart) IMPLANT
PACK EP LF (CUSTOM PROCEDURE TRAY) ×1 IMPLANT
PACK LOOP INSERTION (CUSTOM PROCEDURE TRAY) ×1 IMPLANT
PAD DEFIB RADIO PHYSIO CONN (PAD) ×1 IMPLANT
PATCH CARTO3 (PAD) IMPLANT
SHEATH PINNACLE 8F 10CM (SHEATH) IMPLANT
SHEATH PINNACLE 9F 10CM (SHEATH) IMPLANT
SHEATH PROBE COVER 6X72 (BAG) IMPLANT
TUBING SMART ABLATE COOLFLOW (TUBING) IMPLANT

## 2023-08-17 NOTE — Discharge Instructions (Addendum)
Cardiac Ablation, Care After  This sheet gives you information about how to care for yourself after your procedure. Your health care provider may also give you more specific instructions. If you have problems or questions, contact your health care provider. What can I expect after the procedure? After the procedure, it is common to have: Bruising around your puncture site. Tenderness around your puncture site. Skipped heartbeats. If you had an atrial fibrillation ablation, you may have atrial fibrillation during the first several months after your procedure.  Tiredness (fatigue).  Follow these instructions at home: Puncture site care  Follow instructions from your health care provider about how to take care of your puncture site. Make sure you: Remove square bandages tomorrow at 5:00 pm Check your puncture site every day for signs of infection. Check for: Redness, swelling, or pain. Fluid or blood. If your puncture site starts to bleed, lie down on your back, apply firm pressure to the area, for 15 minutes. Warmth. Pus or a bad smell. A pea or small marble sized lump at the site is normal and can take up to three months to resolve.  Driving Do not drive for at least 4 days after your procedure or however long your health care provider recommends. (Do not resume driving if you have previously been instructed not to drive for other health reasons.) Do not drive or use heavy machinery while taking prescription pain medicine. Activity Avoid activities that take a lot of effort for at least 7 days after your procedure. Do not lift anything that is heavier than 5 lb (4.5 kg) for one week.  No sexual activity for 1 week.  Return to your normal activities as told by your health care provider. Ask your health care provider what activities are safe for you. General instructions Take over-the-counter and prescription medicines only as told by your health care provider. Do not use any products that  contain nicotine or tobacco, such as cigarettes and e-cigarettes. If you need help quitting, ask your health care provider. You may shower after removal of dressings, but Do not take baths, swim, or use a hot tub for 1 week.  Do not drink alcohol for 24 hours after your procedure. Keep all follow-up visits as told by your health care provider. This is important. Contact a health care provider if: You have redness, mild swelling, or pain around your puncture site. You have fluid or blood coming from your puncture site that stops after applying firm pressure to the area. Your puncture site feels warm to the touch. You have pus or a bad smell coming from your puncture site. You have a fever. You have chest pain or discomfort that spreads to your neck, jaw, or arm. You have chest pain that is worse with lying on your back or taking a deep breath. You are sweating a lot. You feel nauseous. You have a fast or irregular heartbeat. You have shortness of breath. You are dizzy or light-headed and feel the need to lie down. You have pain or numbness in the arm or leg closest to your puncture site. Get help right away if: Your puncture site suddenly swells. Your puncture site is bleeding and the bleeding does not stop after applying firm pressure to the area for 15 minutes. These symptoms may represent a serious problem that is an emergency. Do not wait to see if the symptoms will go away. Get medical help right away. Call your local emergency services (911 in the [place] Do  not drive yourself to the hospital. Summary After the procedure, it is normal to have bruising and tenderness at the puncture site in your groin, neck, or forearm. Check your puncture site every day for signs of infection. Get help right away if your puncture site is bleeding and the bleeding does not stop after applying firm pressure to the area. This is a medical emergency. This information is not intended to replace advice given  to you by your health care provider. Make sure you discuss any questions you have with your health care provider.   Care After Your Loop Recorder  You have a Dietitian your cardiac device site for redness, swelling, and drainage. Call the device clinic at [phone] if you experience these symptoms or fever/chills.  If you notice bleeding from your site, hold firm, but gently pressure with two fingers for 5 minutes. Dried blood on the steri-strips when removing the outer bandage is normal.   Keep the large square bandage on your site for 24 hours and then you may remove it yourself. Keep the steri-strips underneath in place.   You may shower after 72 hours / 3 days from your procedure with the steri-strips in place. They will usually fall off on their own, or may be removed after 10 days. Pat dry.   Avoid lotions, ointments, or perfumes over your incision until it is well-healed.  Please do not submerge in water until your site is completely healed.   Your device is MRI compatible.   Remote monitoring is used to monitor your cardiac device from home. This monitoring is scheduled every month by our office. It allows [place] to keep an eye on the function of your device to ensure it is working properly.

## 2023-08-17 NOTE — Anesthesia Postprocedure Evaluation (Signed)
Anesthesia Post Note  Patient: Dustin Fletcher  Procedure(s) Performed: A-FLUTTER ABLATION LOOP RECORDER INSERTION     Patient location during evaluation: PACU Anesthesia Type: General Level of consciousness: awake and alert and oriented Pain management: pain level controlled Vital Signs Assessment: post-procedure vital signs reviewed and stable Respiratory status: spontaneous breathing, nonlabored ventilation and respiratory function stable Cardiovascular status: blood pressure returned to baseline and stable Postop Assessment: no apparent nausea or vomiting Anesthetic complications: no   There were no known notable events for this encounter.  Last Vitals:  Vitals:   08/17/23 1555 08/17/23 1601  BP: 128/73   Pulse: 77   Resp: 17   Temp:  36.9 C  SpO2: 94%     Last Pain:  Vitals:   08/17/23 1601  TempSrc: Temporal  PainSc:                  [name] A.

## 2023-08-17 NOTE — Anesthesia Procedure Notes (Signed)
Procedure Name: Intubation Date/Time: 08/17/2023 2:10 PM  Performed by: Ammie Dalton, CRNAPre-anesthesia Checklist: Patient identified, Emergency Drugs available, Suction available and Patient being monitored Patient Re-evaluated:Patient Re-evaluated prior to induction Oxygen Delivery Method: Circle System Utilized Preoxygenation: Pre-oxygenation with 100% oxygen Induction Type: IV induction Ventilation: Mask ventilation without difficulty and Oral airway inserted - appropriate to patient size Laryngoscope Size: Glidescope and 4 Grade View: Grade I Tube type: Oral Tube size: 8.0 mm Number of attempts: 1 Airway Equipment and Method: Oral airway and Rigid stylet Placement Confirmation: ETT inserted through vocal cords under direct vision, positive ETCO2 and breath sounds checked- equal and bilateral Secured at: 23 cm Tube secured with: Tape Dental Injury: Teeth and Oropharynx as per pre-operative assessment

## 2023-08-17 NOTE — Transfer of Care (Signed)
Immediate Anesthesia Transfer of Care Note  Patient: Dustin Fletcher  Procedure(s) Performed: A-FLUTTER ABLATION LOOP RECORDER INSERTION  Patient Location: PACU and Short Stay  Anesthesia Type:General  Level of Consciousness: awake, alert , and oriented  Airway & Oxygen Therapy: Patient Spontanous Breathing and Patient connected to nasal cannula oxygen  Post-op Assessment: Report given to RN and Post -op Vital signs reviewed and stable  Post vital signs: Reviewed and stable  Last Vitals:  Vitals Value Taken Time  BP 125/71 08/17/23 1531  Temp 36.8 C 08/17/23 1531  Pulse 82 08/17/23 1532  Resp 18 08/17/23 1532  SpO2 96 % 08/17/23 1532  Vitals shown include unfiled device data.  Last Pain:  Vitals:   08/17/23 1531  TempSrc: Temporal  PainSc: 0-No pain         Complications: There were no known notable events for this encounter.

## 2023-08-17 NOTE — Anesthesia Preprocedure Evaluation (Signed)
Anesthesia Evaluation  Patient identified by MRN, date of birth, ID band Patient awake    Reviewed: Allergy & Precautions, NPO status , Patient's Chart, lab work & pertinent test results, reviewed documented beta blocker date and time   Airway Mallampati: II       Dental no notable dental hx. (+) Teeth Intact, Dental Advisory Given   Pulmonary former smoker   Pulmonary exam normal breath sounds clear to auscultation       Cardiovascular hypertension, Pt. on medications and Pt. on home beta blockers Normal cardiovascular exam Rhythm:Regular Rate:Normal     Neuro/Psych Peripheral neuropathy  Neuromuscular disease  negative psych ROS   GI/Hepatic negative GI ROS, Neg liver ROS,,,  Endo/Other  diabetes, Well Controlled, Type 2, Oral Hypoglycemic Agents  GLP-1 RA therapy- last dose 12/4 Hyperlipidemia Obesity  Renal/GU negative Renal ROS  negative genitourinary   Musculoskeletal  (+) Arthritis , Osteoarthritis,    Abdominal  (+) + obese  Peds  Hematology Eliquis therapy- last dose 12/5   Anesthesia Other Findings   Reproductive/Obstetrics                             Anesthesia Physical Anesthesia Plan  ASA: 3  Anesthesia Plan: General   Post-op Pain Management: Minimal or no pain anticipated   Induction: Intravenous  PONV Risk Score and Plan: 3 and Treatment may vary due to age or medical condition, Ondansetron and Dexamethasone  Airway Management Planned: Oral ETT  Additional Equipment:   Intra-op Plan:   Post-operative Plan: Extubation in OR  Informed Consent: I have reviewed the patients History and Physical, chart, labs and discussed the procedure including the risks, benefits and alternatives for the proposed anesthesia with the patient or authorized representative who has indicated his/her understanding and acceptance.     Dental advisory given  Plan Discussed with: CRNA  and Anesthesiologist  Anesthesia Plan Comments:        Anesthesia Quick Evaluation

## 2023-08-17 NOTE — H&P (Signed)
Electrophysiology Office Note:     Date:  08/17/2023    ID:  [name], DOB 1960/10/26, MRN [id number]   [facility] Cardiologist:  Lorine Bears, MD  [facility] Electrophysiologist:  None    Referring MD: Iran Ouch, MD    Chief Complaint: Atrial flutter   History of Present Illness:     Dustin Fletcher is a 62 y.o. malewho I am seeing today for an evaluation of atrial flutter at the request of Dr. Kirke Corin.   The patient was last seen by Dr. Kirke Corin on February 22, 2023.   The patient has a medical history that includes atrial flutter, diabetes, hypertension, hyperlipidemia.  He was diagnosed with atrial flutter in July 2023.  He was started on Eliquis.  He spontaneously converted back to sinus rhythm on subsequent EKG.  At the appointment with Dr. Kirke Corin on June 13, he reported intermittent episodes of palpitations.  A ZIO monitor was ordered and he was referred for consideration of atrial flutter ablation.   He can tell when he is out of rhythm when his heart rates are elevated.  He does not have any symptoms of heart failure, no fluid accumulation.  No lightheadedness or dizziness.  He is interested in avoiding long-term use of medications and Eliquis if at all possible.  He is interested in pursuing catheter ablation as a mechanism to avoid long-term Eliquis use.  His brother has atrial fibrillation and has had an ablation.    Presents for flutter ablation and loop implant.       Objective Their past medical, social and family history was reveiwed.     ROS:   Please see the history of present illness.    All other systems reviewed and are negative.   EKGs/Labs/Other Studies Reviewed:     The following studies were reviewed today:   March 29, 2023 ZIO monitor personally reviewed Heart rate 31-2 14, average 86 Less than 1% burden of atrial flutter, average 147 bpm Symptom triggered episodes corresponded atrial flutter Review of rhythm strip shows only  atrial flutter.  I do not see any clear evidence of atrial fibrillation.   April 04, 2022 EKG shows atrial flutter         Physical Exam:     VS:  BP 155/90   Pulse 76   Ht 6\' 2"  (1.88 m)   Wt 266 lb 12.8 oz (121 kg)   SpO2 96%   BMI 34.26 kg/m         Wt Readings from Last 3 Encounters:  06/06/23 266 lb 12.8 oz (121 kg)  04/02/23 262 lb (118.8 kg)  02/22/23 262 lb 2 oz (118.9 kg)      GEN:  Well nourished, well developed in no acute distress.  Obese CARDIAC: Irregularly irregular, no murmurs, rubs, gallops RESPIRATORY:  Clear to auscultation without rales, wheezing or rhonchi          Assessment ASSESSMENT AND PLAN:     1. Atrial flutter, unspecified type (HCC)   2. Primary hypertension       #Paroxysmal atrial flutter The patient has a history of symptomatic paroxysmal atrial flutter.  I have not seen any clear evidence of atrial fibrillation.  I discussed the treatment options for the patient during today's clinic appointment including continued conservative management, antiarrhythmic drugs and catheter ablation.   Discussed treatment options today for atrial flutter including antiarrhythmic drug therapy and ablation. Discussed risks, recovery and likelihood of success with each treatment  strategy. Risk, benefits, and alternatives to EP study and ablation for afib were discussed. These risks include but are not limited to stroke, bleeding, vascular damage, tamponade, perforation, worsening renal function, coronary vasospasm and death.  Discussed potential need for repeat ablation procedures and antiarrhythmic drugs after an initial ablation. The patient understands these risk and wishes to proceed.  We will therefore proceed with catheter ablation at the next available time.  ICE and anesthesia are requested for the procedure.     Will plan to implant loop recorder at the same time as the atrial flutter ablation for ongoing atrial fibrillation surveillance.  We discussed  the possibility that he would develop atrial fibrillation after a successful atrial flutter ablation.   He will continue his Eliquis for stroke prophylaxis   #Hypertension At goal today.  Recommend checking blood pressures 1-2 times per week at home and recording the values.  Recommend bringing these recordings to the primary care physician.      Presents for CTI and ILR implant.      Signed, Rossie Muskrat. Lalla Brothers, MD, [facility], [facility] 08/17/2023 Electrophysiology [facility]

## 2023-08-20 ENCOUNTER — Encounter (HOSPITAL_COMMUNITY): Payer: Self-pay | Admitting: Cardiology

## 2023-08-20 MED FILL — Midazolam HCl Inj 5 MG/5ML (Base Equivalent): INTRAMUSCULAR | Qty: 2 | Status: AC

## 2023-08-20 MED FILL — Fentanyl Citrate Preservative Free (PF) Inj 100 MCG/2ML: INTRAMUSCULAR | Qty: 2 | Status: AC

## 2023-08-21 ENCOUNTER — Telehealth: Payer: Self-pay

## 2023-08-21 NOTE — Telephone Encounter (Signed)
Follow-up after same day discharge: Implant date: 08/17/2023 MD: Steffanie Dunn  Device: [employer] [facility] Loop Recorder Location:    Wound check visit: 09/19/2023 90 day MD follow-up: n/a  Remote Transmission received:yes  Dressing/sling removed: yes  Confirm OAC restart on: already started medicines  Please continue to monitor your cardiac device site for redness, swelling, and drainage. Call the device clinic at [phone] if you experience these symptoms, fever/chills, or have questions about your device.   Remote monitoring is used to monitor your cardiac device from home. This monitoring is scheduled every 91 days by our office. It allows [place] to keep an eye on the functioning of your device to ensure it is working properly.

## 2023-08-28 ENCOUNTER — Ambulatory Visit: Payer: BC Managed Care – PPO | Attending: Cardiovascular Disease | Admitting: Cardiovascular Disease

## 2023-08-28 ENCOUNTER — Encounter: Payer: Self-pay | Admitting: Cardiovascular Disease

## 2023-08-28 VITALS — BP 114/72 | HR 87 | Ht 74.0 in | Wt 262.8 lb

## 2023-08-28 DIAGNOSIS — I4892 Unspecified atrial flutter: Secondary | ICD-10-CM

## 2023-08-28 DIAGNOSIS — E7800 Pure hypercholesterolemia, unspecified: Secondary | ICD-10-CM

## 2023-08-28 DIAGNOSIS — I2510 Atherosclerotic heart disease of native coronary artery without angina pectoris: Secondary | ICD-10-CM | POA: Diagnosis not present

## 2023-08-28 DIAGNOSIS — I10 Essential (primary) hypertension: Secondary | ICD-10-CM

## 2023-08-28 MED ORDER — ROSUVASTATIN CALCIUM 10 MG PO TABS: 10.0000 mg | ORAL_TABLET | Freq: Every day | ORAL | 3 refills | Status: DC

## 2023-08-28 NOTE — Patient Instructions (Signed)
Medication Instructions:  INCREASE the Rosuvastatin to 10 mg once daily  *If you need a refill on your cardiac medications before your next appointment, please call your pharmacy*   Lab Work: None ordered If you have labs (blood work) drawn today and your tests are completely normal, you will receive your results only by: MyChart Message (if you have MyChart) OR A paper copy in the mail If you have any lab test that is abnormal or we need to change your treatment, we will call you to review the results.   Testing/Procedures: None ordered   Follow-Up: At [facility], you and your health needs are our priority.  As part of our continuing mission to provide you with exceptional heart care, we have created designated Provider Care Teams.  These Care Teams include your primary Cardiologist (physician) and Advanced Practice Providers (APPs -  Physician Assistants and Nurse Practitioners) who all work together to provide you with the care you need, when you need it.  We recommend signing up for the patient portal called "MyChart".  Sign up information is provided on this After Visit Summary.  MyChart is used to connect with patients for Virtual Visits (Telemedicine).  Patients are able to view lab/test results, encounter notes, upcoming appointments, etc.  Non-urgent messages can be sent to your provider as well.   To learn more about what you can do with MyChart, go to [email or web address].    Your next appointment:   6 month(s)  Provider:   You may see Lorine Bears, MD or one of the following Advanced Practice Providers on your designated Care Team:   Nicolasa Ducking, NP Eula Listen, PA-C Cadence Fransico Luisangel, PA-C Charlsie Quest, NP Carlos Levering, NP

## 2023-08-28 NOTE — Progress Notes (Signed)
Cardiology Office Note   Date:  08/28/2023   ID:  Dustin Fletcher, DOB [birth year], MRN [id number]  PCP:  Allegra Grana, [name]  Cardiologist:   Lorine Bears, MD   Chief Complaint  Patient presents with   Follow-up    Patient denies new or acute cardiac problems/concerns today.        History of Present Illness: Dustin Fletcher is a 62 y.o. male who is here today for follow-up visit regarding paroxysmal atrial flutter.   He has past medical history of diabetes mellitus, essential hypertension and hyperlipidemia.  The patient is a non-smoker and does not drink alcohol.  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   He was seen by me in July 2023 and was noted to be in atrial flutter with rapid ventricular response.  I switched him from lisinopril to metoprolol and was subsequently noted to be in sinus rhythm.  He was started on anticoagulation with Eliquis given CHA2DS2-VASc score of 2.  He had an echocardiogram done which showed normal LV systolic function with no significant valvular abnormalities. He was seen in June for worsening palpitations and chest pain.  He underwent cardiac CTA which showed a calcium score of 410 with mild nonobstructive coronary artery disease.  Outpatient monitor confirmed intermittent atrial flutter.  He underwent atrial flutter ablation by Dr. Lalla Brothers on December 6.  In addition, he had a loop recorder placement at the same time to look for any future atrial arrhythmia.  He has been doing well with no recurrent chest pain.  He reports 1 episode of brief palpitations since he had his ablation.  Past Medical History:  Diagnosis Date   Bell's palsy    Diabetes mellitus without complication (HCC)    Hyperlipidemia    Hypertension     Past Surgical History:  Procedure Laterality Date   A-FLUTTER ABLATION N/A 08/17/2023   Procedure: A-FLUTTER ABLATION;  Surgeon: Lanier Prude, MD;  [facility] INVAS[facility] CV LAB;  Service: Cardiovascular;   Laterality: N/A;   HERNIA REPAIR     LOOP RECORDER INSERTION N/A 08/17/2023   Procedure: LOOP RECORDER INSERTION;  Surgeon: Lanier Prude, MD;  Location: [facility] INVASIVE CV LAB;  Service: Cardiovascular;  Laterality: N/A;     Current Outpatient Medications  Medication Sig Dispense Refill   acetaminophen (TYLENOL) 500 MG tablet Take 1,000-1,500 mg by mouth every 6 (six) hours as needed for mild pain (pain score 1-3), moderate pain (pain score 4-6) or headache.     blood glucose meter kit and supplies KIT Dispense based on patient and insurance preference. Use up to four times daily as directed. (FOR ICD-9 250.00, 250.01). 1 each 0   cholecalciferol (VITAMIN D3) 25 MCG (1000 UNIT) tablet Take 1,000 Units by mouth 2 (two) times daily.     Dapagliflozin Pro-metFORMIN ER (XIGDUO XR) 01-999 MG TB24 Take 2 tablets by mouth every morning. 180 tablet 3   ELDERBERRY PO Take 1 tablet by mouth 2 (two) times daily.     ELIQUIS 5 MG TABS tablet TAKE 1 TABLET BY MOUTH TWICE A DAY 60 tablet 5   metoprolol tartrate (LOPRESSOR) 25 MG tablet TAKE 1 TABLET BY MOUTH 2 TIMES A DAY 180 tablet 2   Omega-3 Fatty Acids (FISH OIL) 1000 MG CAPS Take 1,000-2,000 mg by mouth See admin instructions. Take 2000 mg in morning and 1000 mg at bedtime     [name] 14 MG TABS TAKE 1 TABLET BY MOUTH DAILY 90 tablet  1   vitamin B-12 (CYANOCOBALAMIN) 1000 MCG tablet Take 1,000 mcg by mouth daily.     rosuvastatin (CRESTOR) 10 MG tablet Take 1 tablet (10 mg total) by mouth daily. 90 tablet 3   No current facility-administered medications for this visit.    Allergies:   Patient has no known allergies.    Social History:  The patient  reports that he quit smoking about 42 years ago. His smoking use included cigarettes. He has never used smokeless tobacco. He reports that he does not drink alcohol and does not use drugs.   Family History:  The patient's family history includes Cancer in his father and mother; Diabetes in his  father; Heart disease in his brother; Hypertension in his brother and father.    ROS:  Please see the history of present illness.   Otherwise, review of systems are positive for none.   All other systems are reviewed and negative.    PHYSICAL EXAM: VS:  BP 114/72 (BP Location: Left Arm, Patient Position: Sitting, Cuff Size: Large)   Pulse 87   Ht 6\' 2"  (1.88 m)   Wt 262 lb 12.8 oz (119.2 kg)   SpO2 98%   BMI 33.74 kg/m  , BMI Body mass index is 33.74 kg/m. GEN: Well nourished, well developed, in no acute distress  HEENT: normal  Neck: no JVD, carotid bruits, or masses Cardiac: Regular rate and rhythm; no murmurs, rubs, or gallops,no edema  Respiratory:  clear to auscultation bilaterally, normal work of breathing GI: soft, nontender, nondistended, + BS MS: no deformity or atrophy  Skin: warm and dry, no rash Neuro:  Strength and sensation are intact Psych: euthymic mood, full affect   EKG:  EKG is ordered today. The ekg ordered today demonstrates: Normal sinus rhythm Normal ECG When compared with ECG of 17-Aug-2023 15:41, No significant change was found    Recent Labs: 06/28/2023: ALT 24 08/17/2023: BUN 14; Creatinine, Ser 0.83; Hemoglobin 14.4; Platelets 182; Potassium 4.0; Sodium 140    Lipid Panel    Component Value Date/Time   CHOL 121 08/01/2023 0715   TRIG 163 (H) 08/01/2023 0715   HDL 37 (L) 08/01/2023 0715   CHOLHDL 3.3 08/01/2023 0715   LDLCALC 56 08/01/2023 0715      Wt Readings from Last 3 Encounters:  08/28/23 262 lb 12.8 oz (119.2 kg)  08/17/23 268 lb (121.6 kg)  07/05/23 262 lb 3.2 oz (118.9 kg)         04/04/2022    3:08 PM  PAD Screen  Previous PAD dx? No  Previous surgical procedure? No  Pain with walking? No  Feet/toe relief with dangling? No  Painful, non-healing ulcers? No  Extremities discolored? No      ASSESSMENT AND PLAN:  1.  Paroxysmal atrial flutter: Status post atrial flutter ablation.  Will check with Dr. Lalla Brothers to  see when it is possible to stop anticoagulation.  In addition, we might be able to stop metoprolol in the near future.  He does have a loop recorder in place now.    2.  Coronary disease involving native coronary arteries without angina: Moderately elevated calcium score.  Currently with no angina.  Continue medical therapy.  Once Eliquis is stopped, we will have to start aspirin 81 mg daily given that his score was greater than 400.  3.  Hyperlipidemia: Most recent LDL was 53 but triglyceride was mildly elevated.  Given elevated calcium score, I elected to increase rosuvastatin to 10 mg daily.  4.  Essential hypertension: Blood pressure is well-controlled on metoprolol.  He was previously on lisinopril.    Disposition:   FU in 6 months  Signed,  Lorine Bears, MD  08/28/2023 2:02 PM    [facility]

## 2023-09-17 ENCOUNTER — Ambulatory Visit (INDEPENDENT_AMBULATORY_CARE_PROVIDER_SITE_OTHER): Payer: BC Managed Care – PPO

## 2023-09-17 DIAGNOSIS — I4892 Unspecified atrial flutter: Secondary | ICD-10-CM | POA: Diagnosis not present

## 2023-09-19 ENCOUNTER — Ambulatory Visit: Payer: BC Managed Care – PPO | Admitting: Cardiology

## 2023-09-19 LAB — CUP PACEART REMOTE DEVICE CHECK
Date Time Interrogation Session: 20250106045900
Implantable Pulse Generator Implant Date: 20241206
Pulse Gen Serial Number: 131450

## 2023-09-19 NOTE — Progress Notes (Signed)
 This encounter was created in error - please disregard.

## 2023-10-03 ENCOUNTER — Telehealth: Payer: Self-pay

## 2023-10-03 DIAGNOSIS — E1142 Type 2 diabetes mellitus with diabetic polyneuropathy: Secondary | ICD-10-CM

## 2023-10-03 NOTE — Addendum Note (Signed)
Addended by: Allegra Grana on: 10/03/2023 11:01 PM   Modules accepted: Orders

## 2023-10-03 NOTE — Telephone Encounter (Signed)
Left message with answering service for Dr. Delrae Sawyers office requesting lab orders for patient who scheduled lab draw at 7:30 AM tomorrow, 10/03/23. She states someone will call back or put orders in Epic if he needs any labs for upcoming appt.

## 2023-10-03 NOTE — Telephone Encounter (Signed)
A1c ordered Please give verbal for A1c if unable to see

## 2023-10-03 NOTE — Telephone Encounter (Signed)
Copied from CRM [id number]. Topic: Clinical - Request for Lab/Test Order >> Oct 03, 2023  1:43 PM Denese Killings wrote: Reason for CRM: Brandy with [facility] states that pt has an appt tomorrow morning at 7:30 and there are no orders. She wants to know what pt needs or if the orders can be placed in epic.

## 2023-10-04 ENCOUNTER — Other Ambulatory Visit: Payer: Self-pay

## 2023-10-04 ENCOUNTER — Other Ambulatory Visit: Payer: BC Managed Care – PPO

## 2023-10-04 DIAGNOSIS — E1142 Type 2 diabetes mellitus with diabetic polyneuropathy: Secondary | ICD-10-CM | POA: Diagnosis not present

## 2023-10-05 ENCOUNTER — Encounter: Payer: Self-pay | Admitting: Family

## 2023-10-05 ENCOUNTER — Telehealth: Payer: Self-pay

## 2023-10-05 LAB — HGB A1C W/O EAG: Hgb A1c MFr Bld: 7.4 % — ABNORMAL HIGH (ref 4.8–5.6)

## 2023-10-05 LAB — SPECIMEN STATUS REPORT

## 2023-10-05 NOTE — Telephone Encounter (Signed)
LVM to call back to inform pt his A1C was elevated but we can discuss at ext visit on 10/11/23

## 2023-10-05 NOTE — Telephone Encounter (Signed)
Trina from [facility] called to confirm A1C testing that was needed for pt

## 2023-10-08 NOTE — Telephone Encounter (Signed)
LVM to call back to inform pt that his A1C was elevated but we can discuss at next visit on 10/11/23

## 2023-10-09 NOTE — Progress Notes (Unsigned)
Electrophysiology Clinic Note    Date:  10/10/2023  Patient ID:  Dustin Fletcher, Dustin Fletcher 1961-03-11, MRN [id number] PCP:  Allegra Grana, [name]  Cardiologist:  Lorine Bears, MD Electrophysiologist: Lanier Prude, MD   Discussed the use of AI scribe software for clinical note transcription with the patient, who gave verbal consent to proceed.   Patient Profile    Chief Complaint: Aflu ablation follow-up  History of Present Illness: Dustin Fletcher is a 63 y.o. male with PMH notable for aflutter, non-obs CAD, T2DM, HTN; seen today for Lanier Prude, MD for routine electrophysiology followup.  He is s/p aflutter ablation w CTI on 08/17/2023. He had an ILR implanted at the same time for arrhythmia monitoring.  He saw Dr. Kirke Corin 12/17 where he noted one palpitation episode since ablation.   On follow-up today, he is having episodes of increased heart rate, particularly at night. The patient has been using a smartwatch to monitor these episodes and has noticed heart rates as high as 118 bpm during these episodes. The patient also reports feeling hot during these episodes.continues to note brief episodes of palpitations. He is interested in stopping metoprolol and eliquis when safe to do so.  He has some s/s of OSA including snoring and daytime somnolence. He does not want to have at-home sleep study   he denies chest pain, palpitations, dyspnea, PND, orthopnea, nausea, vomiting, dizziness, syncope, edema, weight gain, or early satiety.     Device Information: Bos Sci ILR, imp 08/2023; dx afib/flut mgmt  AAD History: None     ROS:  Please see the history of present illness. All other systems are reviewed and otherwise negative.    Physical Exam    VS:  BP 122/80 (BP Location: Left Arm, Patient Position: Sitting, Cuff Size: Large)   Pulse 94   Ht 6\' 2"  (1.88 m)   Wt 270 lb (122.5 kg)   SpO2 98%   BMI 34.67 kg/m  BMI: Body mass index is 34.67 kg/m.  Wt  Readings from Last 3 Encounters:  10/10/23 270 lb (122.5 kg)  08/28/23 262 lb 12.8 oz (119.2 kg)  08/17/23 268 lb (121.6 kg)     GEN- The patient is well appearing, alert and oriented x 3 today.   Lungs- Clear to ausculation bilaterally, normal work of breathing.  Heart- Regular rate and rhythm, no murmurs, rubs or gallops Extremities- No peripheral edema, warm, dry Skin-  ILR site well-healed    1/29 ILR remote device interrogation reviewed by myself:  Battery good Frequent, brief aflutter episodes noted, overall  burden 1%   Studies Reviewed   Previous EP, cardiology notes.    EKG is ordered. Personal review of EKG from today shows:    EKG Interpretation Date/Time:  Wednesday October 10 2023 13:59:09 EST Ventricular Rate:  94 PR Interval:  188 QRS Duration:  104 QT Interval:  366 QTC Calculation: 457 R Axis:   13  Text Interpretation: Sinus rhythm with Premature supraventricular complexes Low voltage QRS Confirmed by Sherie Don [phone]) on 10/10/2023 2:57:06 PM    Coronary CTA, 03/26/2023 1. Coronary calcium score of 410. This was 87th percentile for age and sex matched control.   2. Normal coronary origin with right dominance.  3. Mild proximal RCA stenosis (25-49%).  4. Minimal proximal LAD stenosis (<25%).  5. CAD-RADS 2. Mild non-obstructive CAD (25-49%). Consider non-atherosclerotic causes of chest pain. Consider preventive therapy and risk factor modification.  Long term monitor, 02/22/2023  Patient had a min HR of 31 bpm, max HR of 214 bpm, and avg HR of 86 bpm. Predominant underlying rhythm was Sinus Rhythm. First Degree AV Block was present. 2 Ventricular Tachycardia runs occurred, the run with the fastest interval lasting 10 beats with a  max rate of 214 bpm (avg 179 bpm); the run with the fastest interval was also the longest. 82  Supraventricular Tachycardia runs occurred, the run with the fastest interval lasting 6 beats with a max rate of 200 bpm, the  longest lasting 23 mins 34 secs with an avg rate of 114 bpm. Atrial Flutter occurred (<1% burden), ranging from 86-168 bpm (avg of 147 bpm), the longest lasting 48 mins 3 secs with an avg rate of 147 bpm. Second Degree AV Block-Mobitz I (Wenckebach) was present. Supraventricular Tachycardia and Atrial Flutter were detected within +/- 45 seconds of symptomatic patient event(s).  Rare PACs and rare PVCs.    Assessment and Plan     #) Aflutter #) ILR in situ Continues to have paroxysmal, brief aflutter episodes, frequently at night Patient notes variable heart rates with intermittent "hot flashes" Encouraged to use symptom activator during potential episodes to correlate rhythm with symptoms Continue 25mg  lopressor BID at this time  #) Hypercoag d/t aflutter  CHA2DS2-VASc Score = at least 3 [CHF History: 0, HTN History: 1, Diabetes History: 1, Stroke History: 0, Vascular Disease History: 1, Age Score: 0, Gender Score: 0].  Therefore, the patient's annual risk of stroke is 3.2 %.    Stroke ppx - 5mg  eliquis, appropriately dosed No bleeding concerns Will continue eliquis at this time  #) sleep disordered breathing #) daytime somnolence Stop Bang score = 5 Patient endorses snoring and daytime fatigue Recommended to undergo sleep study, he is not interested at this time        Current medicines are reviewed at length with the patient today.   The patient does not have concerns regarding his medicines.  The following changes were made today:  none  Labs/ tests ordered today include:  Orders Placed This Encounter  Procedures   EKG 12-Lead     Disposition: Follow up with Dr. Lalla Brothers or EP APP  2-3 months    Signed, Sherie Don, NP  10/10/23  2:59 PM  Electrophysiology [facility]

## 2023-10-10 ENCOUNTER — Encounter: Payer: Self-pay | Admitting: Cardiology

## 2023-10-10 ENCOUNTER — Ambulatory Visit: Payer: BC Managed Care – PPO | Attending: Cardiology | Admitting: Cardiology

## 2023-10-10 VITALS — BP 122/80 | HR 94 | Ht 74.0 in | Wt 270.0 lb

## 2023-10-10 DIAGNOSIS — Z95818 Presence of other cardiac implants and grafts: Secondary | ICD-10-CM | POA: Diagnosis not present

## 2023-10-10 DIAGNOSIS — I4892 Unspecified atrial flutter: Secondary | ICD-10-CM

## 2023-10-10 DIAGNOSIS — D6869 Other thrombophilia: Secondary | ICD-10-CM

## 2023-10-10 NOTE — Patient Instructions (Signed)
Medication Instructions:   Your Physician recommend you continue on your current medication as directed.     *If you need a refill on your cardiac medications before your next appointment, please call your pharmacy*   Lab Work: None ordered.  If you have labs (blood work) drawn today and your tests are completely normal, you will receive your results only by: MyChart Message (if you have MyChart) OR A paper copy in the mail If you have any lab test that is abnormal or we need to change your treatment, we will call you to review the results.   Testing/Procedures:  Please consider a Sleep study    Follow-Up: At [facility], you and your health needs are our priority.  As part of our continuing mission to provide you with exceptional heart care, we have created designated Provider Care Teams.  These Care Teams include your primary Cardiologist (physician) and Advanced Practice Providers (APPs -  Physician Assistants and Nurse Practitioners) who all work together to provide you with the care you need, when you need it.  We recommend signing up for the patient portal called "MyChart".  Sign up information is provided on this After Visit Summary.  MyChart is used to connect with patients for Virtual Visits (Telemedicine).  Patients are able to view lab/test results, encounter notes, upcoming appointments, etc.  Non-urgent messages can be sent to your provider as well.   To learn more about what you can do with MyChart, go to [email or web address].    Your next appointment:   2- 3 month(s)  Provider:   Sherie Don, NP

## 2023-10-11 ENCOUNTER — Encounter: Payer: Self-pay | Admitting: Family

## 2023-10-11 ENCOUNTER — Ambulatory Visit: Payer: BC Managed Care – PPO | Admitting: Family

## 2023-10-11 VITALS — BP 112/70 | HR 95 | Temp 98.0°F | Resp 18 | Ht 74.0 in | Wt 270.4 lb

## 2023-10-11 DIAGNOSIS — Z7984 Long term (current) use of oral hypoglycemic drugs: Secondary | ICD-10-CM | POA: Diagnosis not present

## 2023-10-11 DIAGNOSIS — M25511 Pain in right shoulder: Secondary | ICD-10-CM | POA: Diagnosis not present

## 2023-10-11 DIAGNOSIS — E118 Type 2 diabetes mellitus with unspecified complications: Secondary | ICD-10-CM | POA: Diagnosis not present

## 2023-10-11 DIAGNOSIS — M25519 Pain in unspecified shoulder: Secondary | ICD-10-CM | POA: Insufficient documentation

## 2023-10-11 NOTE — Patient Instructions (Signed)
Concern for shoulder impingement, right arm, based on your exam today.  As discussed, recommend scheduling Tylenol arthritis 650 mg tablet in the morning.  He may also take another tablet in the evening.  Icing 20 minutes, ideally 3 times a day You may try over-the-counter lidocaine patch as well.  Biofreeze is a good product as well  If symptoms do not improve, please let me know the recommend consult orthopedics, physical therapy.    Shoulder Impingement Syndrome  Shoulder impingement syndrome is a condition that causes pain when the tendons of the rotator cuff become pinched. These tendons arise from the muscles that stabilize and move the shoulder joint (rotator cuff). Beneath the rotator cuff tendons is a fluid-filled sac (bursa) that allows the tendons to glide smoothly. The bursa may become swollen or irritated (bursitis). Bursitis, swelling in the rotator cuff tendons, or both conditions can decrease the amount of space under a bone in the shoulder joint (acromion), resulting in impingement. What are the causes? This condition may be caused by bursitis or swelling of the rotator cuff tendons, which may result from: Doing repeated overhead arm movements. Falling onto the shoulder. Weakness in the shoulder muscles. What increases the risk? You may be more likely to develop this condition if you: Play sports that involve throwing, such as baseball. Participate in sports such as tennis, volleyball, and swimming. Work as a Education administrator, Music therapist, or Pharmacologist. Some people are also more likely to develop this condition because of the shape of their acromion bone. What are the signs or symptoms? The main symptom of this condition is pain on the front or side of the shoulder. The pain may: Get worse when lifting or raising your arm. Get worse at night. Wake you up from sleeping. Feel sharp when the shoulder is moved and then fade to an ache. Other symptoms may include: Stiffness. Not  being able to raise the arm above shoulder level or behind the body. Weakness. How is this diagnosed? This condition may be diagnosed based on: Your symptoms and medical history. A physical exam. Imaging tests, such as: X-rays. MRI. Ultrasound. How is this treated? This condition may be treated by: Resting your shoulder and avoiding all activities that cause pain or put stress on the shoulder. Putting ice on your shoulder. Taking NSAIDs, such as ibuprofen, to help reduce pain and swelling. Having injections of medicines to numb the area and reduce inflammation. Doing exercises to improve movement and strength in your shoulder (physical therapy). Having surgery. This may be needed if other treatments have not helped. Surgery may involve repairing the rotator cuff, reshaping the acromion, or removing the bursa. Follow these instructions at home: Managing pain, stiffness, and swelling     If told, put ice on the injured area. Put ice in a plastic bag. Place a towel between your skin and the bag. Leave the ice on for 20 minutes, 2-3 times a day. If told, apply heat to the affected area as often as told by your health care provider. Use the heat source that your provider recommends, such as a moist heat pack or a heating pad. Place a towel between your skin and the heat source. Leave the heat on for 20-30 minutes. Remove the heat if your skin turns bright red. This is especially important if you are unable to feel pain, heat, or cold. You have a greater risk of getting burned. If your skin turns bright red, remove the ice or heat right away to prevent  skin damage. The risk of damage is higher if you cannot feel pain, heat, or cold. Activity Avoid raising or lifting your arm above your affected shoulder as told. Rest as told by your provider. Return to your normal activities as told by your provider. Ask your provider what activities are safe for you. Do exercises as told by your  provider. Ask your provider when it is safe for you to drive. General instructions Take over-the-counter and prescription medicines only as told by your provider. Do not use any products that contain nicotine or tobacco. These products include cigarettes, chewing tobacco, and vaping devices, such as e-cigarettes. These can delay bone healing. If you need help quitting, ask your provider. Keep all follow-up visits. Your provider will check on your condition and adjust treatments as needed. How is this prevented? Give your body time to rest between periods of activity. Be safe and responsible while being active. This will help you avoid falls. Maintain physical fitness. This includes strength in your shoulder muscles and back muscles. Contact a health care provider if: You cannot lift your arm away from your body. Your symptoms are getting worse. Your symptoms have not improved after 4-6 months of treatment. This information is not intended to replace advice given to you by your health care provider. Make sure you discuss any questions you have with your health care provider. Document Revised: 05/25/2022 Document Reviewed: 05/25/2022 Elsevier Patient Education  2024 [employer] Shoulder Impingement Syndrome Rehab Ask your health care provider which exercises are safe for you. Do exercises exactly as told by your provider and adjust them as told. It is normal to feel mild stretching, pulling, tightness, or discomfort as you do these exercises. Stop right away if you feel sudden pain or your pain gets worse. Do not begin these exercises until told by your provider. Stretching and range-of-motion exercise This exercise warms up your muscles and joints and improves the movement and flexibility of your shoulder. This exercise also helps to relieve pain and stiffness. Passive horizontal adduction In passive adduction, you use your other hand to move the injured arm toward your body. The injured arm  does not move on its own. In this movement, your arm is moved across your body in the horizontal plane (horizontal adduction). Sit or stand and pull your left / right elbow across your chest, toward your other shoulder. Stop when you feel a gentle stretch in the back of your shoulder and upper arm. Keep your arm at shoulder height. Keep your arm as close to your body as you comfortably can. Hold for __________ seconds. Slowly return to the starting position. Repeat __________ times. Complete this exercise __________ times a day. Strengthening exercises These exercises build strength and endurance in your shoulder. Endurance is the ability to use your muscles for a long time, even after they get tired. External rotation, isometric This is an exercise in which you press the back of your wrist against a doorframe without moving your shoulder joint (isometric). Stand or sit in a doorway, facing the door frame. Bend your left / right elbow and place the back of your wrist against the doorframe. Only the back of your wrist should be touching the frame. Keep your upper arm at your side. Gently press your wrist against the doorframe, as if you are trying to push your arm away from your abdomen (external rotation). Press as hard as you are able without pain. Avoid shrugging your shoulder while you press your wrist against  the doorframe. Keep your shoulder blade tucked down toward the middle of your back. Hold for __________ seconds. Slowly release the tension, and relax your muscles completely before you repeat the exercise. Repeat __________ times. Complete this exercise __________ times a day. Internal rotation, isometric This is an exercise in which you press your palm against a doorframe without moving your shoulder joint (isometric). Stand or sit in a doorway, facing the doorframe. Bend your left / right elbow and place the palm of your hand against the doorframe. Only your palm should be touching  the frame. Keep your upper arm at your side. Gently press your hand against the doorframe, as if you are trying to push your arm toward your abdomen (internal rotation). Press as hard as you are able without pain. Avoid shrugging your shoulder while you press your hand against the doorframe. Keep your shoulder blade tucked down toward the middle of your back. Hold for __________ seconds. Slowly release the tension, and relax your muscles completely before you repeat the exercise. Repeat __________ times. Complete this exercise __________ times a day. Scapular protraction, supine, isotonic  Lie on your back on a firm surface (supine position). Hold a __________ weight in your left / right hand. Raise your left / right arm straight into the air so your hand is directly above your shoulder joint. Push the weight into the air so your shoulder (scapula) lifts off the surface that you are lying on. The scapula will push up or forward (protraction). Do not move your head, neck, or back. Hold for __________ seconds. Slowly return to the starting position. Let your muscles relax completely before you repeat this exercise. Repeat __________ times. Complete this exercise __________ times a day. Scapular retraction, isotonic  Sit in a stable chair without armrests or stand up. Secure an exercise band to a stable object in front of you so the band is at shoulder height. Hold one end of the exercise band in each hand. Squeeze your shoulder blades together (retraction) and move your elbows slightly behind you. Do not shrug your shoulders upward while you do this. Hold for __________ seconds. Slowly return to the starting position. Repeat __________ times. Complete this exercise __________ times a day. Shoulder extension, isotonic  Sit in a stable chair without armrests or stand up. Secure an exercise band to a stable object in front of you so the band is above shoulder height. Hold one end of the  exercise band in each hand. Straighten your elbows and lift your hands up to shoulder height. Squeeze your shoulder blades together and pull your hands down to the sides of your thighs (extension). Stop when your hands are straight down by your sides. Do not let your hands go behind your body. Hold for __________ seconds. Slowly return to the starting position. Repeat __________ times. Complete this exercise __________ times a day. This information is not intended to replace advice given to you by your health care provider. Make sure you discuss any questions you have with your health care provider. Document Revised: 05/25/2022 Document Reviewed: 05/25/2022 Elsevier Patient Education  2024 [employer].

## 2023-10-11 NOTE — Assessment & Plan Note (Signed)
  Lab Results  Component Value Date   HGBA1C 7.4 (H) 10/04/2023  A1c increased from prior.  Discussed dietary discretion over the holidays.  Patient politely declines changes to regimen at this time.  He will focus on lifestyle modifications. Continue dapagliflozin - metformin 5-1000mg  two tablets QD , rybelsus 14 mg qd .

## 2023-10-11 NOTE — Assessment & Plan Note (Signed)
Acute on chronic.  Presentation consistent with impingement syndrome and/or rotator tendinopathy.  Patient politely declines imaging or physical therapy at this time.  Advised use of Tylenol arthritis, icing, lidocaine pain patch.  Provided home exercises.  He will let me know how he is doing and if he would like to proceed with referral to physical therapy, orthopedics

## 2023-10-11 NOTE — Progress Notes (Signed)
Assessment & Plan:  DM type 2, controlled, with complication [facility]) Assessment & Plan:  Lab Results  Component Value Date   HGBA1C 7.4 (H) 10/04/2023  A1c increased from prior.  Discussed dietary discretion over the holidays.  Patient politely declines changes to regimen at this time.  He will focus on lifestyle modifications. Continue dapagliflozin - metformin 5-1000mg  two tablets QD , rybelsus 14 mg qd .   Orders: -     Basic metabolic panel -     Microalbumin / creatinine urine ratio -     Hemoglobin A1c  Acute pain of right shoulder Assessment & Plan: Acute on chronic.  Presentation consistent with impingement syndrome and/or rotator tendinopathy.  Patient politely declines imaging or physical therapy at this time.  Advised use of Tylenol arthritis, icing, lidocaine pain patch.  Provided home exercises.  He will let me know how he is doing and if he would like to proceed with referral to physical therapy, orthopedics      Return precautions given.   Risks, benefits, and alternatives of the medications and treatment plan prescribed today were discussed, and patient expressed understanding.   Education regarding symptom management and diagnosis given to patient on AVS either electronically or printed.  Return in about 3 months (around 01/09/2024).  Rennie Plowman, [name]  Subjective:    Patient ID: Dustin Fletcher, male    DOB: Jul 04, 1961, 63 y.o.   MRN: [id number]  CC: Dustin Fletcher is a 63 y.o. male who presents today for follow up.   HPI: Right arm upper pain x 8 weeks, unchanged.  Episodic. There are some days when is not as bad  Pain with raising arm   Pain with putting on back pack blower.   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  He is pleased with diabetic regimen   Follow up cardiology yesterday Continued on 25mg  lopressor BID  History of atrial  flutter.  Continued on 5 mg Eliquis BID    Allergies: Patient has no known allergies. Current Outpatient Medications on File Prior to Visit  Medication Sig Dispense Refill   acetaminophen (TYLENOL) 500 MG tablet Take 1,000-1,500 mg by mouth every 6 (six) hours as needed for mild pain (pain score 1-3), moderate pain (pain score 4-6) or headache.     blood glucose meter kit and supplies KIT Dispense based on patient and insurance preference. Use up to four times daily as directed. (FOR ICD-9 250.00, 250.01). 1 each 0   cholecalciferol (VITAMIN D3) 25 MCG (1000 UNIT) tablet Take 1,000 Units by mouth 2 (two) times daily.     Dapagliflozin Pro-metFORMIN ER (XIGDUO XR) 01-999 MG TB24 Take 2 tablets by mouth every morning. 180 tablet 3   ELDERBERRY PO Take 1 tablet by mouth 2 (two) times daily.     ELIQUIS 5 MG TABS tablet TAKE 1 TABLET BY MOUTH TWICE A DAY 60 tablet 5   metoprolol tartrate (LOPRESSOR) 25 MG tablet TAKE 1 TABLET BY MOUTH 2 TIMES A DAY 180 tablet 2   Omega-3 Fatty Acids (FISH OIL) 1000 MG CAPS Take 1,000-2,000 mg by mouth See admin instructions. Take 2000 mg in morning and 1000 mg at bedtime     rosuvastatin (CRESTOR) 10 MG tablet Take 1 tablet (10 mg total) by mouth daily. 90 tablet 3   [name] 14 MG TABS TAKE 1 TABLET BY MOUTH DAILY 90 tablet  1   vitamin B-12 (CYANOCOBALAMIN) 1000 MCG tablet Take 1,000 mcg by mouth daily.     No current facility-administered medications on file prior to visit.    Review of Systems  Constitutional:  Negative for chills and fever.  Respiratory:  Negative for cough.   Cardiovascular:  Negative for chest pain and palpitations.  Gastrointestinal:  Negative for nausea and vomiting.  Musculoskeletal:  Positive for arthralgias. Negative for back pain and neck pain.  Neurological:  Negative for numbness.      Objective:    BP 112/70   Pulse 95   Temp 98 F (36.7 C)   Resp 18   Ht 6\' 2"  (1.88 m)   Wt 270 lb 6 oz (122.6 kg)   SpO2 98%   BMI  34.71 kg/m  BP Readings from Last 3 Encounters:  10/11/23 112/70  10/10/23 122/80  08/28/23 114/72   Wt Readings from Last 3 Encounters:  10/11/23 270 lb 6 oz (122.6 kg)  10/10/23 270 lb (122.5 kg)  08/28/23 262 lb 12.8 oz (119.2 kg)    Physical Exam Vitals reviewed.  Constitutional:      Appearance: He is well-developed.  Cardiovascular:     Rate and Rhythm: Regular rhythm.     Heart sounds: Normal heart sounds.  Pulmonary:     Effort: Pulmonary effort is normal. No respiratory distress.     Breath sounds: Normal breath sounds. No wheezing, rhonchi or rales.  Musculoskeletal:     Cervical back: No torticollis. No pain with movement, spinous process tenderness or muscular tenderness.     Comments: Right Shoulder:   No asymmetry of shoulders when comparing right and left.No pain with palpation over glenohumeral joint lines, [place] joint, AC joint, or bicipital groove. No pain with internal and external rotation. Decreased ROM and pain with resisted lateral extension .   Negative active painful arc sign. No pain, swelling, or ecchymosis noted over long head of biceps.  Strength and sensation normal BUE's.   Skin:    General: Skin is warm and dry.  Neurological:     Mental Status: He is alert.  Psychiatric:        Speech: Speech normal.        Behavior: Behavior normal.

## 2023-10-12 ENCOUNTER — Other Ambulatory Visit: Payer: BC Managed Care – PPO

## 2023-10-12 ENCOUNTER — Other Ambulatory Visit: Payer: Self-pay

## 2023-10-12 DIAGNOSIS — E118 Type 2 diabetes mellitus with unspecified complications: Secondary | ICD-10-CM

## 2023-10-18 ENCOUNTER — Ambulatory Visit (INDEPENDENT_AMBULATORY_CARE_PROVIDER_SITE_OTHER): Payer: BC Managed Care – PPO

## 2023-10-18 ENCOUNTER — Ambulatory Visit (INDEPENDENT_AMBULATORY_CARE_PROVIDER_SITE_OTHER): Payer: Self-pay | Admitting: Adult Health

## 2023-10-18 ENCOUNTER — Other Ambulatory Visit: Payer: Self-pay

## 2023-10-18 ENCOUNTER — Encounter: Payer: Self-pay | Admitting: Adult Health

## 2023-10-18 VITALS — BP 90/64 | HR 86 | Temp 100.4°F | Ht 74.0 in | Wt 270.0 lb

## 2023-10-18 DIAGNOSIS — R509 Fever, unspecified: Secondary | ICD-10-CM

## 2023-10-18 DIAGNOSIS — I4892 Unspecified atrial flutter: Secondary | ICD-10-CM

## 2023-10-18 DIAGNOSIS — R059 Cough, unspecified: Secondary | ICD-10-CM

## 2023-10-18 DIAGNOSIS — J101 Influenza due to other identified influenza virus with other respiratory manifestations: Secondary | ICD-10-CM

## 2023-10-18 LAB — POC SOFIA 2 FLU + SARS ANTIGEN FIA
Influenza A, POC: POSITIVE — AB
Influenza B, POC: NEGATIVE — AB
SARS Coronavirus 2 Ag: NEGATIVE — AB

## 2023-10-18 MED ORDER — OSELTAMIVIR PHOSPHATE 75 MG PO CAPS: 75.0000 mg | ORAL_CAPSULE | Freq: Two times a day (BID) | ORAL | 0 refills | Status: DC

## 2023-10-18 NOTE — Progress Notes (Signed)
 Therapist, [facility] [address] [place], [place] [address]   Office Visit Note  Patient Name: Dustin Fletcher Date of Birth [id number]  Medical Record number [id number]  Date of Service: 10/18/2023  Chief Complaint  Patient presents with   Sick visit    Patient c/o cough with clear white mucus production, diarrhea, sweating, and lightheadedness since Monday. He has been taking Tylenol . He has been in close proximity to several coworkers who are also not feeling well.      HPI Pt is here for a sick visit. He reports 2 days of flu symptoms.  He describes fever, chills, cough and body aches.     Current Medication:  Outpatient Encounter Medications as of 10/18/2023  Medication Sig Note   acetaminophen  (TYLENOL ) 500 MG tablet Take 1,000-1,500 mg by mouth every 6 (six) hours as needed for mild pain (pain score 1-3), moderate pain (pain score 4-6) or headache.    blood glucose meter kit and supplies KIT Dispense based on patient and insurance preference. Use up to four times daily as directed. (FOR ICD-9 250.00, 250.01).    cholecalciferol  (VITAMIN D3) 25 MCG (1000 UNIT) tablet Take 1,000 Units by mouth 2 (two) times daily.    Dapagliflozin  Pro-metFORMIN  ER (XIGDUO  XR) 01-999 MG TB24 Take 2 tablets by mouth every morning.    ELDERBERRY PO Take 1 tablet by mouth 2 (two) times daily.    ELIQUIS  5 MG TABS tablet TAKE 1 TABLET BY MOUTH TWICE A DAY    metoprolol  tartrate (LOPRESSOR ) 25 MG tablet TAKE 1 TABLET BY MOUTH 2 TIMES A DAY 08/14/2023: Confirmed taking   Omega-3 Fatty Acids (FISH OIL) 1000 MG CAPS Take 1,000-2,000 mg by mouth See admin instructions. Take 2000 mg in morning and 1000 mg at bedtime    oseltamivir  (TAMIFLU ) 75 MG capsule Take 1 capsule (75 mg total) by mouth 2 (two) times daily.    rosuvastatin  (CRESTOR ) 10 MG tablet Take 1 tablet (10 mg total) by mouth daily.    [name]  14 MG TABS TAKE 1 TABLET BY MOUTH DAILY    vitamin B-12 (CYANOCOBALAMIN ) 1000 MCG tablet Take 1,000  mcg by mouth daily.    No facility-administered encounter medications on file as of 10/18/2023.      Medical History: Past Medical History:  Diagnosis Date   Bell's palsy    Diabetes mellitus without complication (HCC)    Hyperlipidemia    Hypertension      Vital Signs: BP 90/64   Pulse 86   Temp (!) 100.4 F (38 C)   Ht 6' 2 (1.88 m)   Wt 270 lb (122.5 kg)   SpO2 97%   BMI 34.67 kg/m    Review of Systems  Constitutional:  Positive for chills, fatigue and fever.  Eyes:  Negative for pain and itching.  Respiratory:  Positive for cough.     Physical Exam Vitals reviewed.  Constitutional:      Appearance: Normal appearance.  HENT:     Head: Normocephalic.     Right Ear: Tympanic membrane and ear canal normal.     Left Ear: Tympanic membrane and ear canal normal.     Nose: Nose normal.     Mouth/Throat:     Mouth: Mucous membranes are moist.     Pharynx: No posterior oropharyngeal erythema.  Eyes:     Pupils: Pupils are equal, round, and reactive to light.  Cardiovascular:     Rate and Rhythm: Normal rate.  Pulmonary:  Effort: Pulmonary effort is normal.     Breath sounds: Normal breath sounds.  Lymphadenopathy:     Cervical: No cervical adenopathy.  Neurological:     Mental Status: He is alert.     Results for orders placed or performed in visit on 10/18/23 (from the past 24 hours)  POC SOFIA 2 FLU + SARS ANTIGEN FIA     Status: Abnormal   Collection Time: 10/18/23  8:33 AM  Result Value Ref Range   Influenza A, POC Positive (A) Negative   Influenza B, POC Negative (A) Negative   SARS Coronavirus 2 Ag Negative (A) Negative    Assessment/Plan: 1. Influenza A (Primary) Discussed Potential benefit and adverse effects of Tamiflu .  Patient elected to take treatment.  Encourage supportive measures (fluids, rest cough drops, tea) and over the counter meds (I.e. analgesic/anti-inflammatory, decongestant, antihistamine, cough suppressant)  as needed for  symptom relief.  Discussed infection control measures to prevent spread of infection.  Advised she should not return to class until afebrile (100.4 or above) for 24 hours.  Advised patient to send me a message in MyChart if continue to have fever.  Return to clinic as needed for new/worsening symptoms (I.e. shortness of breath) or if symptoms are not resolving as expected over the next 5-7 days.    2. Cough, unspecified type - POC SOFIA 2 FLU + SARS ANTIGEN FIA  3. Fever, unspecified fever cause - POC SOFIA 2 FLU + SARS ANTIGEN FIA     General Counseling: Rossi verbalizes understanding of the findings of todays visit and agrees with plan of treatment. I have discussed any further diagnostic evaluation that may be needed or ordered today. We also reviewed his medications today. he has been encouraged to call the office with any questions or concerns that should arise related to todays visit.   Orders Placed This Encounter  Procedures   POC SOFIA 2 FLU + SARS ANTIGEN FIA    Meds ordered this encounter  Medications   oseltamivir  (TAMIFLU ) 75 MG capsule    Sig: Take 1 capsule (75 mg total) by mouth 2 (two) times daily.    Dispense:  10 capsule    Refill:  0    Time spent:15 Minutes    Juliene DOROTHA Howells AGNP-C Nurse Practitioner

## 2023-10-19 ENCOUNTER — Telehealth: Payer: Self-pay

## 2023-10-19 ENCOUNTER — Inpatient Hospital Stay
Admission: EM | Admit: 2023-10-19 | Discharge: 2023-10-22 | DRG: 312 | Disposition: A | Discharge status: Home / Self Care | Payer: BC Managed Care – PPO | Attending: Family Medicine | Admitting: Family Medicine

## 2023-10-19 ENCOUNTER — Other Ambulatory Visit: Payer: Self-pay

## 2023-10-19 ENCOUNTER — Observation Stay: Payer: BC Managed Care – PPO

## 2023-10-19 DIAGNOSIS — K529 Noninfective gastroenteritis and colitis, unspecified: Secondary | ICD-10-CM | POA: Diagnosis not present

## 2023-10-19 DIAGNOSIS — Z833 Family history of diabetes mellitus: Secondary | ICD-10-CM

## 2023-10-19 DIAGNOSIS — E860 Dehydration: Secondary | ICD-10-CM | POA: Diagnosis not present

## 2023-10-19 DIAGNOSIS — Z87891 Personal history of nicotine dependence: Secondary | ICD-10-CM

## 2023-10-19 DIAGNOSIS — R7989 Other specified abnormal findings of blood chemistry: Secondary | ICD-10-CM | POA: Diagnosis present

## 2023-10-19 DIAGNOSIS — Z4502 Encounter for adjustment and management of automatic implantable cardiac defibrillator: Secondary | ICD-10-CM

## 2023-10-19 DIAGNOSIS — E785 Hyperlipidemia, unspecified: Secondary | ICD-10-CM | POA: Diagnosis present

## 2023-10-19 DIAGNOSIS — E1142 Type 2 diabetes mellitus with diabetic polyneuropathy: Secondary | ICD-10-CM | POA: Diagnosis not present

## 2023-10-19 DIAGNOSIS — R55 Syncope and collapse: Secondary | ICD-10-CM | POA: Diagnosis not present

## 2023-10-19 DIAGNOSIS — I10 Essential (primary) hypertension: Secondary | ICD-10-CM | POA: Diagnosis present

## 2023-10-19 DIAGNOSIS — I480 Paroxysmal atrial fibrillation: Secondary | ICD-10-CM

## 2023-10-19 DIAGNOSIS — G629 Polyneuropathy, unspecified: Secondary | ICD-10-CM

## 2023-10-19 DIAGNOSIS — Z794 Long term (current) use of insulin: Secondary | ICD-10-CM | POA: Diagnosis not present

## 2023-10-19 DIAGNOSIS — I4892 Unspecified atrial flutter: Secondary | ICD-10-CM | POA: Diagnosis not present

## 2023-10-19 DIAGNOSIS — I951 Orthostatic hypotension: Principal | ICD-10-CM | POA: Diagnosis present

## 2023-10-19 DIAGNOSIS — M1711 Unilateral primary osteoarthritis, right knee: Secondary | ICD-10-CM | POA: Diagnosis present

## 2023-10-19 DIAGNOSIS — J102 Influenza due to other identified influenza virus with gastrointestinal manifestations: Secondary | ICD-10-CM | POA: Diagnosis present

## 2023-10-19 DIAGNOSIS — R569 Unspecified convulsions: Secondary | ICD-10-CM | POA: Diagnosis not present

## 2023-10-19 DIAGNOSIS — E876 Hypokalemia: Secondary | ICD-10-CM | POA: Diagnosis not present

## 2023-10-19 DIAGNOSIS — R000 Tachycardia, unspecified: Secondary | ICD-10-CM | POA: Diagnosis not present

## 2023-10-19 DIAGNOSIS — I2510 Atherosclerotic heart disease of native coronary artery without angina pectoris: Secondary | ICD-10-CM | POA: Diagnosis not present

## 2023-10-19 DIAGNOSIS — Z79899 Other long term (current) drug therapy: Secondary | ICD-10-CM | POA: Diagnosis not present

## 2023-10-19 DIAGNOSIS — R0609 Other forms of dyspnea: Secondary | ICD-10-CM | POA: Diagnosis not present

## 2023-10-19 DIAGNOSIS — Z7901 Long term (current) use of anticoagulants: Secondary | ICD-10-CM

## 2023-10-19 DIAGNOSIS — M549 Dorsalgia, unspecified: Secondary | ICD-10-CM | POA: Diagnosis not present

## 2023-10-19 DIAGNOSIS — Z8249 Family history of ischemic heart disease and other diseases of the circulatory system: Secondary | ICD-10-CM

## 2023-10-19 DIAGNOSIS — R6889 Other general symptoms and signs: Secondary | ICD-10-CM

## 2023-10-19 DIAGNOSIS — R531 Weakness: Secondary | ICD-10-CM | POA: Diagnosis not present

## 2023-10-19 DIAGNOSIS — I495 Sick sinus syndrome: Secondary | ICD-10-CM | POA: Diagnosis not present

## 2023-10-19 DIAGNOSIS — J111 Influenza due to unidentified influenza virus with other respiratory manifestations: Secondary | ICD-10-CM | POA: Diagnosis not present

## 2023-10-19 DIAGNOSIS — Z7984 Long term (current) use of oral hypoglycemic drugs: Secondary | ICD-10-CM

## 2023-10-19 DIAGNOSIS — G6289 Other specified polyneuropathies: Secondary | ICD-10-CM | POA: Diagnosis not present

## 2023-10-19 DIAGNOSIS — R001 Bradycardia, unspecified: Secondary | ICD-10-CM | POA: Diagnosis not present

## 2023-10-19 DIAGNOSIS — I469 Cardiac arrest, cause unspecified: Secondary | ICD-10-CM | POA: Diagnosis not present

## 2023-10-19 DIAGNOSIS — E7800 Pure hypercholesterolemia, unspecified: Secondary | ICD-10-CM | POA: Diagnosis not present

## 2023-10-19 DIAGNOSIS — E118 Type 2 diabetes mellitus with unspecified complications: Secondary | ICD-10-CM | POA: Diagnosis present

## 2023-10-19 DIAGNOSIS — R0989 Other specified symptoms and signs involving the circulatory and respiratory systems: Secondary | ICD-10-CM | POA: Diagnosis not present

## 2023-10-19 HISTORY — DX: Atherosclerotic heart disease of native coronary artery without angina pectoris: I25.10

## 2023-10-19 HISTORY — DX: Unspecified atrial flutter: I48.92

## 2023-10-19 HISTORY — DX: Syncope and collapse: R55

## 2023-10-19 LAB — BASIC METABOLIC PANEL
Anion gap: 16 — ABNORMAL HIGH (ref 5–15)
BUN: 24 mg/dL — ABNORMAL HIGH (ref 8–23)
CO2: 17 mmol/L — ABNORMAL LOW (ref 22–32)
Calcium: 8.3 mg/dL — ABNORMAL LOW (ref 8.9–10.3)
Chloride: 104 mmol/L (ref 98–111)
Creatinine, Ser: 1.03 mg/dL (ref 0.61–1.24)
GFR, Estimated: >60 mL/min (ref >60)
Glucose, Bld: 163 mg/dL — ABNORMAL HIGH (ref 70–99)
Potassium: 3.4 mmol/L — ABNORMAL LOW (ref 3.5–5.1)
Sodium: 137 mmol/L (ref 135–145)

## 2023-10-19 LAB — URINALYSIS, ROUTINE W REFLEX MICROSCOPIC
Bacteria, UA: NONE SEEN
Bilirubin Urine: NEGATIVE
Glucose, UA: >=500 mg/dL — AB
Hgb urine dipstick: NEGATIVE
Ketones, ur: 5 mg/dL — AB
Leukocytes,Ua: NEGATIVE
Nitrite: NEGATIVE
Protein, ur: 30 mg/dL — AB
Specific Gravity, Urine: 1.026 (ref 1.005–1.030)
pH: 5 (ref 5.0–8.0)

## 2023-10-19 LAB — CBC
HCT: 45.7 % (ref 39.0–52.0)
Hemoglobin: 14.8 g/dL (ref 13.0–17.0)
MCH: 28.3 pg (ref 26.0–34.0)
MCHC: 32.4 g/dL (ref 30.0–36.0)
MCV: 87.4 fL (ref 80.0–100.0)
Platelets: 141 10*3/uL — ABNORMAL LOW (ref 150–400)
RBC: 5.23 MIL/uL (ref 4.22–5.81)
RDW: 14.6 % (ref 11.5–15.5)
WBC: 4.7 10*3/uL (ref 4.0–10.5)
nRBC: 0 % (ref 0.0–0.2)

## 2023-10-19 LAB — MRSA NEXT GEN BY PCR, NASAL: MRSA by PCR Next Gen: NOT DETECTED

## 2023-10-19 LAB — PROCALCITONIN: Procalcitonin: <0.1 ng/mL

## 2023-10-19 LAB — CBG MONITORING, ED: Glucose-Capillary: 98 mg/dL (ref 70–99)

## 2023-10-19 LAB — TROPONIN I (HIGH SENSITIVITY)
Troponin I (High Sensitivity): 10 ng/L (ref <18)
Troponin I (High Sensitivity): 11 ng/L (ref <18)

## 2023-10-19 MED ORDER — LACTATED RINGERS IV BOLUS
1000.0000 mL | Freq: Once | INTRAVENOUS | Status: AC
  Administered 2023-10-19: 1000 mL via INTRAVENOUS

## 2023-10-19 MED ORDER — INSULIN ASPART 100 UNIT/ML IJ SOLN
0.0000 [IU] | Freq: Three times a day (TID) | INTRAMUSCULAR | Status: DC
  Administered 2023-10-20 – 2023-10-21 (×3): 2 [IU] via SUBCUTANEOUS
  Administered 2023-10-22: 3 [IU] via SUBCUTANEOUS
  Administered 2023-10-22: 2 [IU] via SUBCUTANEOUS
  Filled 2023-10-19 (×5): qty 1

## 2023-10-19 MED ORDER — OSELTAMIVIR PHOSPHATE 75 MG PO CAPS
75.0000 mg | ORAL_CAPSULE | Freq: Two times a day (BID) | ORAL | Status: DC
  Administered 2023-10-19 – 2023-10-20 (×2): 75 mg via ORAL
  Filled 2023-10-19 (×3): qty 1

## 2023-10-19 MED ORDER — VITAMIN B-12 1000 MCG PO TABS
1000.0000 ug | ORAL_TABLET | Freq: Every day | ORAL | Status: DC
  Administered 2023-10-20 – 2023-10-22 (×3): 1000 ug via ORAL
  Filled 2023-10-19: qty 1
  Filled 2023-10-19: qty 2
  Filled 2023-10-19: qty 1

## 2023-10-19 MED ORDER — HYDRALAZINE HCL 20 MG/ML IJ SOLN: 5.0000 mg | Freq: Four times a day (QID) | INTRAMUSCULAR | Status: DC | PRN

## 2023-10-19 MED ORDER — INSULIN ASPART 100 UNIT/ML IJ SOLN
0.0000 [IU] | Freq: Every day | INTRAMUSCULAR | Status: DC
Start: 2023-10-19 — End: 2023-10-22

## 2023-10-19 MED ORDER — SENNOSIDES-DOCUSATE SODIUM 8.6-50 MG PO TABS: 1.0000 | ORAL_TABLET | Freq: Every evening | ORAL | Status: DC | PRN

## 2023-10-19 MED ORDER — ONDANSETRON HCL 4 MG PO TABS: 4.0000 mg | ORAL_TABLET | Freq: Four times a day (QID) | ORAL | Status: DC | PRN

## 2023-10-19 MED ORDER — ACETAMINOPHEN 325 MG PO TABS
650.0000 mg | ORAL_TABLET | Freq: Four times a day (QID) | ORAL | Status: DC | PRN
  Administered 2023-10-19: 650 mg via ORAL
  Filled 2023-10-19: qty 2

## 2023-10-19 MED ORDER — METOPROLOL TARTRATE 25 MG PO TABS: 25.0000 mg | ORAL_TABLET | Freq: Two times a day (BID) | ORAL | Status: DC

## 2023-10-19 MED ORDER — VITAMIN D 25 MCG (1000 UNIT) PO TABS
1000.0000 [IU] | ORAL_TABLET | Freq: Two times a day (BID) | ORAL | Status: DC
  Administered 2023-10-19 – 2023-10-22 (×6): 1000 [IU] via ORAL
  Filled 2023-10-19 (×6): qty 1

## 2023-10-19 MED ORDER — SODIUM CHLORIDE 0.9% FLUSH
3.0000 mL | Freq: Two times a day (BID) | INTRAVENOUS | Status: DC
  Administered 2023-10-19 – 2023-10-22 (×6): 3 mL via INTRAVENOUS

## 2023-10-19 MED ORDER — LACTATED RINGERS IV SOLN: INTRAVENOUS | Status: AC

## 2023-10-19 MED ORDER — ACETAMINOPHEN 650 MG RE SUPP: 650.0000 mg | Freq: Four times a day (QID) | RECTAL | Status: DC | PRN

## 2023-10-19 MED ORDER — ROSUVASTATIN CALCIUM 10 MG PO TABS
10.0000 mg | ORAL_TABLET | Freq: Every day | ORAL | Status: DC
  Administered 2023-10-19 – 2023-10-21 (×3): 10 mg via ORAL
  Filled 2023-10-19 (×5): qty 1

## 2023-10-19 MED ORDER — APIXABAN 5 MG PO TABS
5.0000 mg | ORAL_TABLET | Freq: Two times a day (BID) | ORAL | Status: DC
  Administered 2023-10-19 – 2023-10-22 (×6): 5 mg via ORAL
  Filled 2023-10-19 (×6): qty 1

## 2023-10-19 MED ORDER — ONDANSETRON HCL 4 MG/2ML IJ SOLN: 4.0000 mg | Freq: Four times a day (QID) | INTRAMUSCULAR | Status: DC | PRN

## 2023-10-19 NOTE — ED Notes (Signed)
 Pt brought in because of the flu. Says that he fell earlier and thinks it's because of dehydration. Pt is A&Ox4 and denies N/V/D today, but has experienced it the last 2 days. Last time pt was able to eat/drink and it stayed was was "probably Tuesday."

## 2023-10-19 NOTE — Assessment & Plan Note (Signed)
 Hydralazine  5 mg IV every 6 hours as needed for systolic blood pressure > 165

## 2023-10-19 NOTE — Assessment & Plan Note (Signed)
 Status post ablation in December 2024

## 2023-10-19 NOTE — Assessment & Plan Note (Addendum)
 Diarrhea Presumed secondary to influenza A, symptomatic support Status post LR 1 L bolus per EDP Continue with LR infusion at 125 mL/h, 1 day ordered

## 2023-10-19 NOTE — ED Notes (Signed)
 Called lab about the add on procalcitonin, and they are able to add it to what they have in the lab.

## 2023-10-19 NOTE — ED Triage Notes (Signed)
 Pt from home via EMS- per EMS, pt was flu+ yesterday and today c/o N/V and multiple syncopal episodes, and positive orthostatic hypotension. Per pt's wife internal cardiac monitor recorded episode of asystole for 6-8 seconds. Pt AOX4, NAD noted. Pt c/o shakiness.  CBG-175 128/76 82 96.8 100% RA 500 ml's LR and 4 mg Zofran  gvn IV

## 2023-10-19 NOTE — Telephone Encounter (Signed)
 ILR alert for pause Event occurred 2/7 @ 05:34, duration 6.1sec per device, escape beat, SB in the 20's.   Route to triage high alert per protocol Hx of AF, burden 3%, Eliquis  per PA report Symptom events for Heart racing, fluttering, EGM's c/w ST LA, CVRS  Spoke to wife, EMS there at present, he is going to the hospital. Has had the flu, not eating, drinking, States he passed out this am during pause event.  FYI to Dr. Cindie, Dr. Darron.           SYMPTOM EVENT: appears ST rate

## 2023-10-19 NOTE — Assessment & Plan Note (Addendum)
 Eliquis  5 mg p.o. twice daily resumed Metoprolol  tartrate 25 mg p.o. twice daily resumed

## 2023-10-19 NOTE — Assessment & Plan Note (Signed)
 Insulin SSI with at bedtime coverage ordered

## 2023-10-19 NOTE — Assessment & Plan Note (Addendum)
 Etiology workup in progress, differentials include cardiac dysrhythmia, heart failure, postviral MRSA pneumonia, pneumonia, urinary tract infection Agree with check for UA with routine with reflex microscopy Check procalcitonin, complete echo, MRSA PCR, portable chest x-ray CT head w/o contrast Cardiology specialist, Dr. Zenaida consulted and he is aware Fall precautions

## 2023-10-19 NOTE — Assessment & Plan Note (Signed)
 By implanted cardiac loop recorder Cardiology has been consulted, Dr. Alease Amend aware

## 2023-10-19 NOTE — Hospital Course (Addendum)
 Dustin Fletcher is a 63 year old male with non-insulin -dependent diabetes, hypertension, paroxysmal A-fib on Eliquis , atrial flutter status post ablation in December 2024, with implanted loop recorder, hyperlipidemia, CAD, who presented to the ED after a syncopal episode.  Patient went to his PCP on 2/6 and tested positive for influenza.  He was initiated on Tamiflu  which he has been taking as prescribed.  He was also suffering from diarrhea.  He then had 2 syncopal episodes on 2/7.  ILR company reported to the patient's wife that he had a 6.1-second pause with escape beat, sinus bradycardia in the 20s at the time of this event.  He was brought to the ED.  On arrival in the ED vital signs were within normal limits and labs revealed a slightly elevated creatinine above his baseline.  Troponin was trended 10 -> 11-> 11. Cardiology was consulted.  All beta-blockers and Tamiflu  were discontinued due to worsening bradycardia.  Patient was monitored on telemetry.  He was noted to have intermittent runs of A-fib but no further pauses.  EP was also consulted and requested the patient remain in house on telemetry over the weekend until evaluation on 2/10.  On 2/10 EP recommended the patient be discharged and follow-up with the clinic with continued monitoring.  Echocardiogram was performed which revealed LVEF 60 to 65%, no regional wall motion abnormalities, no valvular dysfunction. Patient reported feeling back to his physiologic baseline at time of discharge.    ILR interrogation 10/19/2023: ILR alert for pause Event occurred 2/7 @ 05:34, duration 6.1sec per device, escape beat, SB in the 20's.   Route to triage high alert per protocol Hx of AF, burden 3%, Eliquis  per PA report

## 2023-10-19 NOTE — ED Notes (Signed)
 Called CCMD and added pt to the monitor.

## 2023-10-19 NOTE — H&P (Addendum)
 History and Physical   Dustin Fletcher FMW:[id number] DOB: [birth year] DOA: 10/19/2023  PCP: Dustin Fletcher MATSU, [name]  Outpatient Specialists: Dustin Fletcher, [facility] Patient coming from: home  I have personally briefly reviewed patient's old medical records in [facility] EMR.  Chief Concern: syncope  HPI: [name] is a 63 year old male with history of non-insulin -dependent diabetes mellitus, hypertension, paroxysmal atrial fibrillation on Eliquis , atrial flutter status post ablation in 08/17/2023, hyperlipidemia, CAD, who presents emergency department for chief concerns of syncope.  Of note: Spouse endorse that cardiac monitor recorded episode of asystole for 6 to 8 seconds.  Vitals in the ED showed T 96.7, RR 19, HR 74, blood pressure 134/89, SpO2 100% on room air.  Serum sodium is 137, potassium 3.4, chloride 104, bicarb 17, BUN of 24, serum creatinine 1.03, EGFR greater than 60, nonfasting blood glucose 163, WBC 4.7, hemoglobin 14.8, platelets of 141.    High sensitive troponin is 10.  Patient tested positive for influenza A outpatient on 10/17/2022.  ED treatment: LR 1 L bolus. ----------------------------------- At bedside, patient was able to tell me his first middle and last name, age, location, current calendar year.  He reports that he has been feeling poorly for the past few days especially since Monday.  However he did not present to the doctor until Thursday, 2/6.  He reports that he passed out twice today before coming to the emergency department.  The first time, was approximately 5:30 in the morning, he was standing and urinating and then the next and he knew he was on the floor.  His wife came in and saw him laying on the floor and called EMS.  He was being wheeled into EMS when wife received a phone call from the loop recorder checking on patient as he was reported to have asystole for approximately 6 to 8 seconds at 5:26 AM.  Patient started taking his  Tamiflu  as prescribed yesterday.  He denies dysuria, hematuria, nausea, vomiting, chest pain, shortness of breath.  He did have 2 episodes of diarrhea yesterday.  He reports that the diarrhea was watery and loose.  He reports that he did miss the toilet because it came on so fast.  Social history: Patient lives at home with his wife.  He denies tobacco, EtOH, recreational drug use.  He is a former tobacco user, quit many years ago.  ROS: Constitutional: no weight change, no fever ENT/Mouth: no sore throat, no rhinorrhea Eyes: no eye pain, no vision changes Cardiovascular: no chest pain, no dyspnea,  no edema, no palpitations Respiratory: no cough, no sputum, no wheezing Gastrointestinal: no nausea, no vomiting, + diarrhea, no constipation Genitourinary: no urinary incontinence, no dysuria, no hematuria Musculoskeletal: no arthralgias, no myalgias Skin: no skin lesions, no pruritus, Neuro: + weakness, + loss of consciousness, + syncope Psych: no anxiety, no depression, + decrease appetite Heme/Lymph: no bruising, no bleeding  ED Course: Discussed with EDP, patient requiring hospitalization for chief concerns of syncope.  Assessment/Plan  Principal Problem:   Syncope Active Problems:   Hypertension   DM type 2, controlled, with complication (HCC)   Hyperlipidemia   Diabetic peripheral neuropathy (HCC)   Osteoarthritis of right knee   Peripheral neuropathy   Atrial flutter (HCC)   Paroxysmal atrial fibrillation (HCC)   Gastroenteritis   Asystole (HCC)   Assessment and Plan:  * Syncope Etiology workup in progress, differentials include cardiac dysrhythmia, heart failure, postviral MRSA pneumonia, pneumonia, urinary tract infection Agree with check for UA  with routine with reflex microscopy Check procalcitonin, complete echo, MRSA PCR, portable chest x-ray CT head w/o contrast Cardiology specialist, Dr. Zenaida consulted and he is aware Fall precautions  Asystole (HCC) By  implanted cardiac loop recorder Cardiology has been consulted, Dr. Zenaida aware  Gastroenteritis Diarrhea Presumed secondary to influenza A, symptomatic support Status post LR 1 L bolus per EDP Continue with LR infusion at 125 mL/h, 1 day ordered  Paroxysmal atrial fibrillation (HCC) Eliquis  5 mg p.o. twice daily resumed Metoprolol  tartrate 25 mg p.o. twice daily resumed  Atrial flutter (HCC) Status post ablation in December 2024  Hyperlipidemia Rosuvastatin  10 mg nightly resumed  DM type 2, controlled, with complication (HCC) Insulin  SSI with at bedtime coverage ordered  Hypertension Hydralazine  5 mg IV every 6 hours as needed for systolic blood pressure > 165  Chart reviewed.   DVT prophylaxis: Apixaban  5 mg p.o. twice daily Code Status: Full code Diet: Heart healthy/carb modified Family Communication: Updated mother, son at bedside with patient's permission Disposition Plan: Pending clinical course Consults called: None at this time, a.m. team to consult cardiology pending complete echo Admission status: Telemetry cardiac, observation  Past Medical History:  Diagnosis Date   Bell's palsy    Diabetes mellitus without complication (HCC)    Hyperlipidemia    Hypertension    Past Surgical History:  Procedure Laterality Date   A-FLUTTER ABLATION N/A 08/17/2023   Procedure: A-FLUTTER ABLATION;  Surgeon: Dustin Ole DASEN, MD;  Location: [facility] INVASIVE CV LAB;  Service: Cardiovascular;  Laterality: N/A;   HERNIA REPAIR     LOOP RECORDER INSERTION N/A 08/17/2023   Procedure: LOOP RECORDER INSERTION;  Surgeon: Dustin Ole DASEN, MD;  Location: [facility] INVASIVE CV LAB;  Service: Cardiovascular;  Laterality: N/A;   Social History:  reports that he quit smoking about 42 years ago. His smoking use included cigarettes. He has never used smokeless tobacco. He reports that he does not drink alcohol and does not use drugs.  No Known Allergies Family History  Problem Relation Age of  Onset   Cancer Mother    Cancer Father    Diabetes Father    Hypertension Father    Heart disease Brother    Hypertension Brother    Family history: Family history reviewed and not pertinent.  Prior to Admission medications   Medication Sig Start Date End Date Taking? Authorizing Provider  acetaminophen  (TYLENOL ) 500 MG tablet Take 1,000-1,500 mg by mouth every 6 (six) hours as needed for mild pain (pain score 1-3), moderate pain (pain score 4-6) or headache.    [provider]  blood glucose meter kit and supplies KIT Dispense based on patient and insurance preference. Use up to four times daily as directed. (FOR ICD-9 250.00, 250.01). 10/15/20   Dustin Fletcher MATSU, [name]  cholecalciferol  (VITAMIN D3) 25 MCG (1000 UNIT) tablet Take 1,000 Units by mouth 2 (two) times daily.    [provider]  Dapagliflozin  Pro-metFORMIN  ER (XIGDUO  XR) 01-999 MG TB24 Take 2 tablets by mouth every morning. 08/23/22   Dustin Fletcher MATSU, [name]  ELDERBERRY PO Take 1 tablet by mouth 2 (two) times daily.    [provider]  ELIQUIS  5 MG TABS tablet TAKE 1 TABLET BY MOUTH TWICE A DAY 05/11/23   Fletcher Deatrice LABOR, MD  metoprolol  tartrate (LOPRESSOR ) 25 MG tablet TAKE 1 TABLET BY MOUTH 2 TIMES A DAY 07/06/23   Fletcher Deatrice LABOR, MD  Omega-3 Fatty Acids (FISH OIL) 1000 MG CAPS Take 1,000-2,000 mg by  mouth See admin instructions. Take 2000 mg in morning and 1000 mg at bedtime    [provider]  oseltamivir  (TAMIFLU ) 75 MG capsule Take 1 capsule (75 mg total) by mouth 2 (two) times daily. 10/18/23   Scarboro, Adam J, NP  rosuvastatin  (CRESTOR ) 10 MG tablet Take 1 tablet (10 mg total) by mouth daily. 08/28/23   Fletcher Deatrice LABOR, MD  [name]  14 MG TABS TAKE 1 TABLET BY MOUTH DAILY 08/15/23   Dustin Fletcher MATSU, [name]  vitamin B-12 (CYANOCOBALAMIN ) 1000 MCG tablet Take 1,000 mcg by mouth daily.    [provider]   Physical Exam: Vitals:   10/19/23 1345 10/19/23 1400 10/19/23 1800  10/19/23 1815  BP: 116/84 126/81 118/75   Pulse: 73 80 86   Resp: 14 13 17    Temp:    98 F (36.7 C)  TempSrc:      SpO2:  100% 98%   Weight:      Height:       Constitutional: appears age-appropriate, NAD, calm Eyes: PERRL, lids and conjunctivae normal ENMT: Mucous membranes are moist. Posterior pharynx clear of any exudate or lesions. Age-appropriate dentition. Hearing appropriate Neck: normal, supple, no masses, no thyromegaly Respiratory: clear to auscultation bilaterally, no wheezing, no crackles. Normal respiratory effort. No accessory muscle use.  Cardiovascular: Regular rate and rhythm, no murmurs / rubs / gallops. No extremity edema. 2+ pedal pulses. No carotid bruits.  Abdomen: Obese abdomen, no tenderness, no masses palpated, no hepatosplenomegaly. Bowel sounds positive.  Musculoskeletal: no clubbing / cyanosis. No joint deformity upper and lower extremities. Good ROM, no contractures, no atrophy. Normal muscle tone.  Skin: no rashes, lesions, ulcers. No induration Neurologic: Sensation intact. Strength 5/5 in all 4.  Psychiatric: Normal judgment and insight. Alert and oriented x 3. Normal mood.   EKG: independently reviewed, showing sinus rhythm with rate of 78, QTc 499  Chest x-ray on Admission: I personally reviewed and I agree with radiologist reading as below.  Portable Chest 1 View Result Date: 10/19/2023 CLINICAL DATA:  106001 Syncope 106001 142094 Dyspnea on exertion [id number] EXAM: PORTABLE CHEST 1 VIEW COMPARISON:  10/24/2021. FINDINGS: Low lung volume. Bilateral lung fields are clear. Bilateral costophrenic angles are clear. Normal cardio-mediastinal silhouette. Loop recorder device noted overlying the left lower chest. No acute osseous abnormalities. The soft tissues are within normal limits. IMPRESSION: No active disease. Electronically Signed   By: Ree Molt M.D.   On: 10/19/2023 15:26   Labs on Admission: I have personally reviewed following  labs  CBC: Recent Labs  Lab 10/19/23 1138  WBC 4.7  HGB 14.8  HCT 45.7  MCV 87.4  PLT 141*   Basic Metabolic Panel: Recent Labs  Lab 10/19/23 1138  NA 137  K 3.4*  CL 104  CO2 17*  GLUCOSE 163*  BUN 24*  CREATININE 1.03  CALCIUM  8.3*   GFR: Estimated Creatinine Clearance: 103.4 mL/min (by C-G formula based on SCr of 1.03 mg/dL).  Urine analysis:    Component Value Date/Time   COLORURINE YELLOW (A) 10/19/2023 1544   APPEARANCEUR CLEAR (A) 10/19/2023 1544   APPEARANCEUR Clear 10/31/2018 0740   LABSPEC 1.026 10/19/2023 1544   PHURINE 5.0 10/19/2023 1544   GLUCOSEU >=500 (A) 10/19/2023 1544   HGBUR NEGATIVE 10/19/2023 1544   BILIRUBINUR NEGATIVE 10/19/2023 1544   BILIRUBINUR Negative 10/31/2018 0740   KETONESUR 5 (A) 10/19/2023 1544   PROTEINUR 30 (A) 10/19/2023 1544   NITRITE NEGATIVE 10/19/2023 1544   LEUKOCYTESUR NEGATIVE 10/19/2023  1544   This document was prepared using Dragon Voice Recognition software and may include unintentional dictation errors.  Dr. Sherre [facility]  If 7PM-7AM, please contact overnight-coverage provider If 7AM-7PM, please contact day attending provider [email or web address]  10/19/2023, 7:29 PM

## 2023-10-19 NOTE — Assessment & Plan Note (Signed)
Continue Crestor and Zetia

## 2023-10-19 NOTE — ED Provider Notes (Signed)
 [facility] Provider Note   Event Date/Time   First MD Initiated Contact with Patient 10/19/23 1204     (approximate) History  No chief complaint on file.  HPI Dustin Fletcher is a 63 y.o. male with a past medical history of type 2 diabetes, atrial flutter with loop recorder in place who presents after being diagnosed with flu a yesterday, complaining of nausea/vomiting/diarrhea and multiple syncopal episodes this morning with positive orthostatic hypotension per EMS.  Patient was called by his loop recorder company and told that he was in asystole for 6-8 seconds. ROS: Patient currently denies any vision changes, tinnitus, difficulty speaking, facial droop, sore throat, chest pain, abdominal pain, dysuria, or weakness/numbness/paresthesias in any extremity   Physical Exam  Triage Vital Signs: ED Triage Vitals  Encounter Vitals Group     BP 10/19/23 1130 134/89     Systolic BP Percentile --      Diastolic BP Percentile --      Pulse Rate 10/19/23 1130 74     Resp 10/19/23 1130 19     Temp 10/19/23 1208 (!) 96.7 F (35.9 C)     Temp Source 10/19/23 1208 Axillary     SpO2 10/19/23 1130 100 %     Weight 10/19/23 1132 270 lb (122.5 kg)     Height 10/19/23 1132 6' 2 (1.88 m)     Head Circumference --      Peak Flow --      Pain Score 10/19/23 1132 0     Pain Loc --      Pain Education --      Exclude from Growth Chart --    Most recent vital signs: Vitals:   10/20/23 0607 10/20/23 0612  BP:  128/72  Pulse:  77  Resp:  18  Temp: 97.6 F (36.4 C)   SpO2:  98%   General: Awake, oriented x4. CV:  Good peripheral perfusion.  Resp:  Normal effort.  Abd:  No distention.  Other:  Obese caucasian male resting comfortably in no acute distress ED Results / Procedures / Treatments  Labs (all labs ordered are listed, but only abnormal results are displayed) Labs Reviewed  BASIC METABOLIC PANEL - Abnormal; Notable for the following components:       Result Value   Potassium 3.4 (*)    CO2 17 (*)    Glucose, Bld 163 (*)    BUN 24 (*)    Calcium  8.3 (*)    Anion gap 16 (*)    All other components within normal limits  CBC - Abnormal; Notable for the following components:   Platelets 141 (*)    All other components within normal limits  URINALYSIS, ROUTINE W REFLEX MICROSCOPIC - Abnormal; Notable for the following components:   Color, Urine YELLOW (*)    APPearance CLEAR (*)    Glucose, UA >=500 (*)    Ketones, ur 5 (*)    Protein, ur 30 (*)    All other components within normal limits  BASIC METABOLIC PANEL - Abnormal; Notable for the following components:   Potassium 3.3 (*)    BUN 24 (*)    Calcium  8.6 (*)    All other components within normal limits  CBC - Abnormal; Notable for the following components:   Platelets 143 (*)    All other components within normal limits  MRSA NEXT GEN BY PCR, NASAL  PROCALCITONIN  CBG MONITORING, ED  CBG MONITORING, ED  TROPONIN I (  HIGH SENSITIVITY)  TROPONIN I (HIGH SENSITIVITY)   EKG ED ECG REPORT I, Artist MARLA Kerns, the attending physician, personally viewed and interpreted this ECG. Date: 10/19/2023 EKG Time: 1131 Rate: 78 Rhythm: normal sinus rhythm QRS Axis: normal Intervals: normal ST/T Wave abnormalities: normal Narrative Interpretation: no evidence of acute ischemia RADIOLOGY ED MD interpretation: CT of the head without contrast interpreted by me shows no evidence of acute abnormalities including no intracerebral hemorrhage, obvious masses, or significant edema  One-view portable chest x-ray interpreted by me shows no evidence of acute abnormalities including no pneumonia, pneumothorax, or widened mediastinum -Agree with radiology assessment Official radiology report(s): CT HEAD WO CONTRAST ( ) Result Date: 10/19/2023 CLINICAL DATA:  Initial evaluation for acute syncope/presyncope, CVA suspected. EXAM: CT HEAD WITHOUT CONTRAST TECHNIQUE: Contiguous axial images were  obtained from the base of the skull through the vertex without intravenous contrast. RADIATION DOSE REDUCTION: This exam was performed according to the departmental dose-optimization program which includes automated exposure control, adjustment of the mA and/or kV according to patient size and/or use of iterative reconstruction technique. COMPARISON:  Prior MRI from 11/25/2014. FINDINGS: Brain: Cerebral volume within normal limits for patient age. No acute intracranial hemorrhage. No acute large vessel territory infarct. No mass lesion, midline shift, or mass effect. Ventricles are normal in size without hydrocephalus. No extra-axial fluid collection. Vascular: No abnormal hyperdense vessel.Scattered vascular calcifications noted within the carotid siphons. Skull: Scalp soft tissues demonstrate no acute abnormality. Calvarium intact. Sinuses/Orbits: Globes and orbital soft tissues within normal limits. Mild scattered mucosal thickening noted about the sphenoid ethmoidal and maxillary sinuses. No mastoid effusion. IMPRESSION: Normal head CT.  No acute intracranial abnormality. Electronically Signed   By: Morene Hoard M.D.   On: 10/19/2023 19:42   Portable Chest 1 View Result Date: 10/19/2023 CLINICAL DATA:  106001 Syncope 106001 142094 Dyspnea on exertion 142094 EXAM: PORTABLE CHEST 1 VIEW COMPARISON:  10/24/2021. FINDINGS: Low lung volume. Bilateral lung fields are clear. Bilateral costophrenic angles are clear. Normal cardio-mediastinal silhouette. Loop recorder device noted overlying the left lower chest. No acute osseous abnormalities. The soft tissues are within normal limits. IMPRESSION: No active disease. Electronically Signed   By: Ree Molt M.D.   On: 10/19/2023 15:26   PROCEDURES: Critical Care performed: No .1-3 Lead EKG Interpretation  Performed by: Kerns Artist MARLA, MD Authorized by: Kerns Artist MARLA, MD     Interpretation: normal     ECG rate:  71   ECG rate assessment: normal      Rhythm: sinus rhythm     Ectopy: none     Conduction: normal    MEDICATIONS ORDERED IN ED: Medications  oseltamivir  (TAMIFLU ) capsule 75 mg (75 mg Oral Given 10/19/23 2230)  rosuvastatin  (CRESTOR ) tablet 10 mg (10 mg Oral Given 10/19/23 2230)  sodium chloride  flush (NS) 0.9 % injection 3 mL (3 mLs Intravenous Not Given 10/19/23 2235)  apixaban  (ELIQUIS ) tablet 5 mg (5 mg Oral Given 10/19/23 2230)  senna-docusate (Senokot-S) tablet 1 tablet (has no administration in time range)  acetaminophen  (TYLENOL ) tablet 650 mg (650 mg Oral Given 10/19/23 1958)    Or  acetaminophen  (TYLENOL ) suppository 650 mg ( Rectal See Alternative 10/19/23 1958)  ondansetron  (ZOFRAN ) tablet 4 mg (has no administration in time range)    Or  ondansetron  (ZOFRAN ) injection 4 mg (has no administration in time range)  insulin  aspart (novoLOG ) injection 0-15 Units ( Subcutaneous Not Given 10/19/23 1812)  insulin  aspart (novoLOG ) injection 0-5 Units ( Subcutaneous Not Given 10/19/23  2238)  lactated ringers  infusion ( Intravenous New Bag/Given 10/20/23 0607)  hydrALAZINE  (APRESOLINE ) injection 5 mg (has no administration in time range)  cyanocobalamin  (VITAMIN B12) tablet 1,000 mcg (has no administration in time range)  cholecalciferol  (VITAMIN D3) 25 MCG (1000 UNIT) tablet 1,000 Units (1,000 Units Oral Given 10/19/23 2230)  lactated ringers  bolus 1,000 mL (0 mLs Intravenous Stopped 10/19/23 1405)  methocarbamol  (ROBAXIN ) tablet 500 mg (500 mg Oral Given 10/20/23 0309)   IMPRESSION / MDM / ASSESSMENT AND PLAN / ED COURSE  I reviewed the triage vital signs and the nursing notes.                             The patient is on the cardiac monitor to evaluate for evidence of arrhythmia and/or significant heart rate changes. Patient's presentation is most consistent with acute presentation with potential threat to life or bodily function. Patient presents with complaints of syncope/presyncope ED Workup:  CBC, BMP, Troponin, BNP, ECG,  CXR Differential diagnosis includes HF, ICH, seizure, stroke, HOCM, ACS, aortic dissection, malignant arrhythmia, or GI bleed. Findings: No evidence of acute laboratory abnormalities.  Troponin negative x1 EKG: No e/o STEMI. No evidence of Brugadas sign, delta wave, epsilon wave, significantly prolonged QTc, or malignant arrhythmia.  Disposition: Admit to medicine, telemetry bed for cardiac monitoring and cardiology review.   FINAL CLINICAL IMPRESSION(S) / ED DIAGNOSES   Final diagnoses:  Syncope and collapse  Flu-like symptoms   Rx / DC Orders   ED Discharge Orders     None      Note:  This document was prepared using Dragon voice recognition software and may include unintentional dictation errors.   Kylii Ennis K, MD 10/20/23 [phone]

## 2023-10-20 ENCOUNTER — Observation Stay (HOSPITAL_COMMUNITY)
Admit: 2023-10-20 | Discharge: 2023-10-20 | Disposition: A | Discharge status: Home / Self Care | Payer: BC Managed Care – PPO | Attending: Internal Medicine | Admitting: Internal Medicine

## 2023-10-20 ENCOUNTER — Encounter: Payer: Self-pay | Admitting: Internal Medicine

## 2023-10-20 DIAGNOSIS — Z7901 Long term (current) use of anticoagulants: Secondary | ICD-10-CM | POA: Diagnosis not present

## 2023-10-20 DIAGNOSIS — Z4502 Encounter for adjustment and management of automatic implantable cardiac defibrillator: Secondary | ICD-10-CM | POA: Diagnosis not present

## 2023-10-20 DIAGNOSIS — I2510 Atherosclerotic heart disease of native coronary artery without angina pectoris: Secondary | ICD-10-CM | POA: Diagnosis present

## 2023-10-20 DIAGNOSIS — E7800 Pure hypercholesterolemia, unspecified: Secondary | ICD-10-CM | POA: Diagnosis not present

## 2023-10-20 DIAGNOSIS — E785 Hyperlipidemia, unspecified: Secondary | ICD-10-CM

## 2023-10-20 DIAGNOSIS — M1711 Unilateral primary osteoarthritis, right knee: Secondary | ICD-10-CM | POA: Diagnosis present

## 2023-10-20 DIAGNOSIS — K529 Noninfective gastroenteritis and colitis, unspecified: Secondary | ICD-10-CM | POA: Diagnosis not present

## 2023-10-20 DIAGNOSIS — M549 Dorsalgia, unspecified: Secondary | ICD-10-CM | POA: Diagnosis present

## 2023-10-20 DIAGNOSIS — E876 Hypokalemia: Secondary | ICD-10-CM | POA: Diagnosis present

## 2023-10-20 DIAGNOSIS — I4892 Unspecified atrial flutter: Secondary | ICD-10-CM | POA: Diagnosis present

## 2023-10-20 DIAGNOSIS — I480 Paroxysmal atrial fibrillation: Secondary | ICD-10-CM | POA: Diagnosis present

## 2023-10-20 DIAGNOSIS — J102 Influenza due to other identified influenza virus with gastrointestinal manifestations: Secondary | ICD-10-CM | POA: Diagnosis present

## 2023-10-20 DIAGNOSIS — Z79899 Other long term (current) drug therapy: Secondary | ICD-10-CM | POA: Diagnosis not present

## 2023-10-20 DIAGNOSIS — Z8249 Family history of ischemic heart disease and other diseases of the circulatory system: Secondary | ICD-10-CM | POA: Diagnosis not present

## 2023-10-20 DIAGNOSIS — I10 Essential (primary) hypertension: Secondary | ICD-10-CM

## 2023-10-20 DIAGNOSIS — Z794 Long term (current) use of insulin: Secondary | ICD-10-CM | POA: Diagnosis not present

## 2023-10-20 DIAGNOSIS — R7989 Other specified abnormal findings of blood chemistry: Secondary | ICD-10-CM | POA: Diagnosis present

## 2023-10-20 DIAGNOSIS — Z87891 Personal history of nicotine dependence: Secondary | ICD-10-CM | POA: Diagnosis not present

## 2023-10-20 DIAGNOSIS — R0609 Other forms of dyspnea: Secondary | ICD-10-CM | POA: Diagnosis not present

## 2023-10-20 DIAGNOSIS — E860 Dehydration: Secondary | ICD-10-CM | POA: Diagnosis present

## 2023-10-20 DIAGNOSIS — R55 Syncope and collapse: Secondary | ICD-10-CM | POA: Diagnosis present

## 2023-10-20 DIAGNOSIS — I495 Sick sinus syndrome: Secondary | ICD-10-CM | POA: Diagnosis present

## 2023-10-20 DIAGNOSIS — E1142 Type 2 diabetes mellitus with diabetic polyneuropathy: Secondary | ICD-10-CM | POA: Diagnosis present

## 2023-10-20 DIAGNOSIS — G6289 Other specified polyneuropathies: Secondary | ICD-10-CM | POA: Diagnosis not present

## 2023-10-20 DIAGNOSIS — Z7984 Long term (current) use of oral hypoglycemic drugs: Secondary | ICD-10-CM | POA: Diagnosis not present

## 2023-10-20 DIAGNOSIS — I951 Orthostatic hypotension: Secondary | ICD-10-CM | POA: Diagnosis present

## 2023-10-20 DIAGNOSIS — Z833 Family history of diabetes mellitus: Secondary | ICD-10-CM | POA: Diagnosis not present

## 2023-10-20 DIAGNOSIS — I469 Cardiac arrest, cause unspecified: Secondary | ICD-10-CM | POA: Diagnosis not present

## 2023-10-20 LAB — CBC
HCT: 40.2 % (ref 39.0–52.0)
Hemoglobin: 13.3 g/dL (ref 13.0–17.0)
MCH: 28.7 pg (ref 26.0–34.0)
MCHC: 33.1 g/dL (ref 30.0–36.0)
MCV: 86.6 fL (ref 80.0–100.0)
Platelets: 143 10*3/uL — ABNORMAL LOW (ref 150–400)
RBC: 4.64 MIL/uL (ref 4.22–5.81)
RDW: 14.6 % (ref 11.5–15.5)
WBC: 4 10*3/uL (ref 4.0–10.5)
nRBC: 0 % (ref 0.0–0.2)

## 2023-10-20 LAB — ECHOCARDIOGRAM COMPLETE
AR max vel: 2.94 cm2
AV Area VTI: 3.44 cm2
AV Area mean vel: 3.37 cm2
AV Mean grad: 2 mm[Hg]
AV Peak grad: 3.7 mm[Hg]
Ao pk vel: 0.97 m/s
Area-P 1/2: 4.6 cm2
Height: 74 in
MV VTI: 3.63 cm2
S' Lateral: 3.1 cm
Weight: 4320 [oz_av]

## 2023-10-20 LAB — GLUCOSE, CAPILLARY
Glucose-Capillary: 112 mg/dL — ABNORMAL HIGH (ref 70–99)
Glucose-Capillary: 135 mg/dL — ABNORMAL HIGH (ref 70–99)

## 2023-10-20 LAB — BASIC METABOLIC PANEL
Anion gap: 12 (ref 5–15)
BUN: 24 mg/dL — ABNORMAL HIGH (ref 8–23)
CO2: 24 mmol/L (ref 22–32)
Calcium: 8.6 mg/dL — ABNORMAL LOW (ref 8.9–10.3)
Chloride: 104 mmol/L (ref 98–111)
Creatinine, Ser: 0.82 mg/dL (ref 0.61–1.24)
GFR, Estimated: >60 mL/min (ref >60)
Glucose, Bld: 97 mg/dL (ref 70–99)
Potassium: 3.3 mmol/L — ABNORMAL LOW (ref 3.5–5.1)
Sodium: 140 mmol/L (ref 135–145)

## 2023-10-20 LAB — CBG MONITORING, ED
Glucose-Capillary: 99 mg/dL (ref 70–99)
Glucose-Capillary: 131 mg/dL — ABNORMAL HIGH (ref 70–99)

## 2023-10-20 LAB — MAGNESIUM: Magnesium: 1.9 mg/dL (ref 1.7–2.4)

## 2023-10-20 LAB — TSH: TSH: 3.145 u[IU]/mL (ref 0.350–4.500)

## 2023-10-20 MED ORDER — METHOCARBAMOL 500 MG PO TABS
500.0000 mg | ORAL_TABLET | Freq: Once | ORAL | Status: AC
  Administered 2023-10-20: 500 mg via ORAL
  Filled 2023-10-20: qty 1

## 2023-10-20 MED ORDER — DM-GUAIFENESIN ER 30-600 MG PO TB12
1.0000 | ORAL_TABLET | Freq: Two times a day (BID) | ORAL | Status: DC
  Administered 2023-10-20 – 2023-10-22 (×5): 1 via ORAL
  Filled 2023-10-20 (×6): qty 1

## 2023-10-20 MED ORDER — PERFLUTREN LIPID MICROSPHERE
1.0000 mL | INTRAVENOUS | Status: AC | PRN
  Administered 2023-10-20: 2 mL via INTRAVENOUS

## 2023-10-20 MED ORDER — ACETAMINOPHEN 325 MG PO TABS
650.0000 mg | ORAL_TABLET | Freq: Four times a day (QID) | ORAL | Status: AC
  Administered 2023-10-21 (×2): 650 mg via ORAL
  Filled 2023-10-20 (×2): qty 2

## 2023-10-20 MED ORDER — LIDOCAINE 5 % EX PTCH
1.0000 | MEDICATED_PATCH | CUTANEOUS | Status: DC
  Administered 2023-10-20 – 2023-10-21 (×2): 1 via TRANSDERMAL
  Filled 2023-10-20 (×3): qty 1

## 2023-10-20 MED ORDER — HYDROCODONE-ACETAMINOPHEN 5-325 MG PO TABS
1.0000 | ORAL_TABLET | Freq: Four times a day (QID) | ORAL | Status: DC | PRN
  Administered 2023-10-20 – 2023-10-21 (×3): 1 via ORAL
  Filled 2023-10-20 (×3): qty 1

## 2023-10-20 MED ORDER — SALINE SPRAY 0.65 % NA SOLN
1.0000 | NASAL | Status: DC | PRN
  Filled 2023-10-20: qty 44

## 2023-10-20 MED ORDER — BACLOFEN 10 MG PO TABS
10.0000 mg | ORAL_TABLET | Freq: Three times a day (TID) | ORAL | Status: DC
  Administered 2023-10-20 – 2023-10-22 (×7): 10 mg via ORAL
  Filled 2023-10-20 (×9): qty 1

## 2023-10-20 MED ORDER — POTASSIUM CHLORIDE CRYS ER 20 MEQ PO TBCR
30.0000 meq | EXTENDED_RELEASE_TABLET | Freq: Two times a day (BID) | ORAL | Status: AC
  Administered 2023-10-20 (×2): 30 meq via ORAL
  Filled 2023-10-20: qty 1
  Filled 2023-10-20: qty 2

## 2023-10-20 NOTE — Consult Note (Addendum)
 Cardiology Consultation:   Patient ID: Dustin Fletcher; [id number]; 1960-12-08   Admit date: 10/19/2023 Date of Consult: 10/20/2023  Primary Care Provider: Dineen Rollene MATSU, [name] Primary Cardiologist: Darron Primary Electrophysiologist:  Cindie   Patient Profile:   Dustin Fletcher is a 63 y.o. male with a hx of paroxysmal atrial flutter status post ablation on 08/17/2023 on Lopressor  and apixaban , DM2, HTN, and HLD who is being seen today for the evaluation of syncope with associated sinus pause of 6.1 seconds at the request of Dr. Sherre.  History of Present Illness:   Mr. Detwiler was seen in 03/2022 and noted to be in atrial flutter with RVR.  He was transition from lisinopril  to metoprolol  and subsequently noted to be in sinus rhythm.  He was started on anticoagulation.  Echo showed normal LV systolic function with no significant valvular abnormalities.  He was seen in 02/2023 with worsening palpitations and chest pain.  Coronary CTA showed a calcium  score of 410 with mild nonobstructive CAD.  Outpatient cardiac monitor showed intermittent atrial flutter.  He underwent atrial flutter ablation by Dr. Cindie on 08/17/2023.  In addition, he had a loop recorder placement at the same time to look for future atrial arrhythmia.  He was seen by EP on 10/10/2023 noting episodes of tachycardic rates and feeling hot during these episodes.  He continued to note brief episodes of palpitations.  He was interested in stopping metoprolol  and apixaban .  There was also concern for sleep disordered breathing with snoring and daytime somnolence.  Interrogation of loop recorder at that time showed frequent, brief episodes of atrial flutter with an overall burden of 1%, lasting up to 1 hour and 22 minutes.  It was recommended he continue Lopressor  25 mg twice daily and apixaban .  He was not interested in pursuing sleep study.   He was diagnosed with influenza A on 10/18/2023 with associated diminished appetite and  diarrhea.   On 2/7, the patient had 2 syncopal episodes.  During the initial episode, he was standing and urinating around 5:30 in the morning.  Loop recorder showed a 6.1-second pause at 5:34 in the morning with escape beat and sinus bradycardia in the 20s bpm.  Overall burden of A-fib of 3%.  He subsequently laid down in the bed and went back to sleep.  Upon waking around 8:30 AM he sat up on the edge of the bed and developed a room spinning sensation.  The next thing he knew he is waking up on the floor laying on pillows (were previously placed on the floor as they were not needed on the bed).  During these episodes, he denies chest pain, dyspnea, or palpitations.  He was subsequently brought to [facility] ED and was found to be hemodynamically stable and afebrile.  EKG showed NSR, 78 bpm, low voltage QRS, no acute ST-T changes.  Troponin negative x 2.  Potassium 3.4 trending to 3.3, BUN 24, serum creatinine 1.03, glucose 163, Hgb 14.8, PCT less than 0.1.  Chest x-ray showed no active disease.  CT head showed no acute intracranial abnormality.  Upon admission, the patient was continued on PTA Lopressor  25 mg twice daily.  He has yet to receive this medication at time of cardiology consult.  Review of telemetry since arrival to the ED shows a sinus rhythm with no evidence of high-grade AV block, prolonged pauses, or atrial arrhythmias.  Last dose of metoprolol  was approximately 8:30 AM on 10/19/2023.   Past Medical History:  Diagnosis Date  Atrial flutter (HCC)    Bell's palsy    Coronary artery disease, non-occlusive    Diabetes mellitus without complication (HCC)    Hyperlipidemia    Hypertension    Syncope     Past Surgical History:  Procedure Laterality Date   A-FLUTTER ABLATION N/A 08/17/2023   Procedure: A-FLUTTER ABLATION;  Surgeon: Cindie Ole DASEN, MD;  Location: [facility] INVASIVE CV LAB;  Service: Cardiovascular;  Laterality: N/A;   HERNIA REPAIR     LOOP RECORDER INSERTION N/A 08/17/2023    Procedure: LOOP RECORDER INSERTION;  Surgeon: Cindie Ole DASEN, MD;  Location: [facility] INVASIVE CV LAB;  Service: Cardiovascular;  Laterality: N/A;     Home Meds: Prior to Admission medications   Medication Sig Start Date End Date Taking? Authorizing Provider  acetaminophen  (TYLENOL ) 500 MG tablet Take 1,000-1,500 mg by mouth every 6 (six) hours as needed for mild pain (pain score 1-3), moderate pain (pain score 4-6) or headache.   Yes [provider]  cholecalciferol  (VITAMIN D3) 25 MCG (1000 UNIT) tablet Take 1,000 Units by mouth 2 (two) times daily.   Yes [provider]  Dapagliflozin  Pro-metFORMIN  ER (XIGDUO  XR) 01-999 MG TB24 Take 2 tablets by mouth every morning. 08/23/22  Yes Arnett, Rollene MATSU, [name]  ELDERBERRY PO Take 1 tablet by mouth 2 (two) times daily.   Yes [provider]  ELIQUIS  5 MG TABS tablet TAKE 1 TABLET BY MOUTH TWICE A DAY 05/11/23  Yes Darron Deatrice LABOR, MD  metoprolol  tartrate (LOPRESSOR ) 25 MG tablet TAKE 1 TABLET BY MOUTH 2 TIMES A DAY 07/06/23  Yes Darron Deatrice LABOR, MD  Omega-3 Fatty Acids (FISH OIL) 1000 MG CAPS Take 1,000-2,000 mg by mouth See admin instructions. Take 2000 mg in morning and 1000 mg at bedtime   Yes [provider]  oseltamivir  (TAMIFLU ) 75 MG capsule Take 1 capsule (75 mg total) by mouth 2 (two) times daily. 10/18/23  Yes Scarboro, Juliene PARAS, NP  rosuvastatin  (CRESTOR ) 10 MG tablet Take 1 tablet (10 mg total) by mouth daily. 08/28/23  Yes Darron Deatrice LABOR, MD  [name]  14 MG TABS TAKE 1 TABLET BY MOUTH DAILY 08/15/23  Yes Arnett, Rollene MATSU, [name]  vitamin B-12 (CYANOCOBALAMIN ) 1000 MCG tablet Take 1,000 mcg by mouth daily.   Yes [provider]  blood glucose meter kit and supplies KIT Dispense based on patient and insurance preference. Use up to four times daily as directed. (FOR ICD-9 250.00, 250.01). 10/15/20   Dineen Rollene MATSU, [name]    Inpatient Medications: Scheduled Meds:  apixaban   5 mg Oral BID    cholecalciferol   1,000 Units Oral BID   cyanocobalamin   1,000 mcg Oral Daily   insulin  aspart  0-15 Units Subcutaneous TID WC   insulin  aspart  0-5 Units Subcutaneous QHS   oseltamivir   75 mg Oral BID   rosuvastatin   10 mg Oral QHS   sodium chloride  flush  3 mL Intravenous Q12H   Continuous Infusions:  lactated ringers  125 mL/hr at 10/20/23 0607   PRN Meds: acetaminophen  **OR** acetaminophen , hydrALAZINE , ondansetron  **OR** ondansetron  (ZOFRAN ) IV, senna-docusate  Allergies:  No Known Allergies  Social History:   Social History   Socioeconomic History   Marital status: Married    Spouse name: Not on file   Number of children: Not on file   Years of education: Not on file   Highest education level: Not on file  Occupational History   Not on file  Tobacco Use   Smoking  status: Former    Current packs/day: 0.00    Types: Cigarettes    Quit date: 05/31/1981    Years since quitting: 42.4   Smokeless tobacco: Never  Vaping Use   Vaping status: Never Used  Substance and Sexual Activity   Alcohol use: No    Alcohol/week: 0.0 standard drinks of alcohol   Drug use: No   Sexual activity: Not on file  Other Topics Concern   Not on file  Social History Narrative   Facilities Manager - [employer]      Social Drivers of Health   Financial Resource Strain: Patient Declined (10/07/2023)   Overall Financial Resource Strain (CARDIA)    Difficulty of Paying Living Expenses: Patient declined  Food Insecurity: Patient Declined (10/07/2023)   Hunger Vital Sign    Worried About Running Out of Food in the Last Year: Patient declined    Ran Out of Food in the Last Year: Patient declined  Transportation Needs: Patient Declined (10/07/2023)   PRAPARE - Administrator, Civil Service (Medical): Patient declined    Lack of Transportation (Non-Medical): Patient declined  Physical Activity: Sufficiently Active (10/07/2023)   Exercise Vital Sign    Days of Exercise per Week: 5 days    Minutes  of Exercise per Session: 60 min  Stress: No Stress Concern Present (10/07/2023)   [employer] of Occupational Health - Occupational Stress Questionnaire    Feeling of Stress : Not at all  Social Connections: Unknown (10/07/2023)   Social Connection and Isolation Panel [NHANES]    Frequency of Communication with Friends and Family: Patient declined    Frequency of Social Gatherings with Friends and Family: Patient declined    Attends Religious Services: Patient declined    Database Administrator or Organizations: Patient declined    Attends Engineer, Structural: Not on file    Marital Status: Patient declined  Catering Manager Violence: Not on file     Family History:   Family History  Problem Relation Age of Onset   Cancer Mother    Cancer Father    Diabetes Father    Hypertension Father    Heart disease Brother    Hypertension Brother     ROS:  Review of Systems  Constitutional:  Positive for chills, fever and malaise/fatigue. Negative for diaphoresis and weight loss.  HENT:  Positive for congestion.   Eyes:  Negative for discharge and redness.  Respiratory:  Negative for cough, hemoptysis, shortness of breath and wheezing.   Cardiovascular:  Negative for chest pain, palpitations, orthopnea, claudication, leg swelling and PND.  Gastrointestinal:  Positive for diarrhea. Negative for abdominal pain, heartburn, nausea and vomiting.  Musculoskeletal:  Positive for back pain and falls. Negative for myalgias.  Skin:  Negative for rash.  Neurological:  Positive for loss of consciousness and weakness. Negative for dizziness, tingling, tremors, sensory change, speech change and focal weakness.  Endo/Heme/Allergies:  Does not bruise/bleed easily.  Psychiatric/Behavioral:  Negative for substance abuse. The patient is not nervous/anxious.   All other systems reviewed and are negative.     Physical Exam/Data:   Vitals:   10/20/23 0300 10/20/23 0351 10/20/23 0607 10/20/23  0612  BP: 120/73   128/72  Pulse: 77   77  Resp: 13   18  Temp:  97.6 F (36.4 C) 97.6 F (36.4 C)   TempSrc:  Oral    SpO2: 97%   98%  Weight:      Height:  Intake/Output Summary (Last 24 hours) at 10/20/2023 0824 Last data filed at 10/20/2023 0657 Gross per 24 hour  Intake 1956.22 ml  Output --  Net 1956.22 ml   Filed Weights   10/19/23 1132  Weight: 122.5 kg   Body mass index is 34.67 kg/m.   Physical Exam: General: Well developed, well nourished, in no acute distress. Head: Normocephalic, atraumatic, sclera non-icteric, no xanthomas, nares without discharge.  Neck: Negative for carotid bruits. JVD not elevated. Lungs: Clear bilaterally to auscultation without wheezes, rales, or rhonchi. Breathing is unlabored. Heart: RRR with S1 S2. No murmurs, rubs, or gallops appreciated. Abdomen: Soft, non-tender, non-distended with normoactive bowel sounds. No hepatomegaly. No rebound/guarding. No obvious abdominal masses. Msk:  Strength and tone appear normal for age. Extremities: No clubbing or cyanosis. No edema. Distal pedal pulses are 2+ and equal bilaterally. Neuro: Alert and oriented X 3. No facial asymmetry. No focal deficit. Moves all extremities spontaneously. Psych:  Responds to questions appropriately with a normal affect.   EKG:  The EKG was personally reviewed and demonstrates: NSR, 78 bpm, no acute ST-T changes Telemetry:  Telemetry was personally reviewed and demonstrates: Sinus rhythm 60s to 80s bpm, no evidence of high-grade AV block or prolonged pauses, no atrial arrhythmia  Weights: Filed Weights   10/19/23 1132  Weight: 122.5 kg    Relevant CV Studies:  ILR interrogation 10/19/2023: ILR alert for pause Event occurred 2/7 @ 05:34, duration 6.1sec per device, escape beat, SB in the 20's.   Route to triage high alert per protocol Hx of AF, burden 3%, Eliquis  per PA report __________  Coronary CTA 03/26/2023: IMPRESSION: 1. Coronary calcium  score of  410. This was 87th percentile for age and sex matched control.   2. Normal coronary origin with right dominance.   3. Mild proximal RCA stenosis (25-49%).   4. Minimal proximal LAD stenosis (<25%).   5. CAD-RADS 2. Mild non-obstructive CAD (25-49%). Consider non-atherosclerotic causes of chest pain. Consider preventive therapy and risk factor modification. __________  2D echo 04/05/2022: 1. Left ventricular ejection fraction, by estimation, is 60 to 65%. The  left ventricle has normal function. The left ventricle has no regional  wall motion abnormalities. There is mild left ventricular hypertrophy.  Left ventricular diastolic parameters  are consistent with Grade I diastolic dysfunction (impaired relaxation).   2. Right ventricular systolic function is normal. The right ventricular  size is mildly enlarged. There is normal pulmonary artery systolic  pressure.   3. Right atrial size was mildly dilated.   4. The mitral valve is normal in structure. No evidence of mitral valve  regurgitation. No evidence of mitral stenosis.   5. The aortic valve is tricuspid. There is mild thickening of the aortic  valve. Aortic valve regurgitation is not visualized. Aortic valve  sclerosis is present, with no evidence of aortic valve stenosis.   6. Aortic dilatation noted. There is borderline dilatation of the aortic  root, measuring 38 mm.   7. The inferior vena cava is normal in size with greater than 50%  respiratory variability, suggesting right atrial pressure of 3 mmHg.     Laboratory Data:  Chemistry Recent Labs  Lab 10/19/23 1138 10/20/23 0351  NA 137 140  K 3.4* 3.3*  CL 104 104  CO2 17* 24  GLUCOSE 163* 97  BUN 24* 24*  CREATININE 1.03 0.82  CALCIUM  8.3* 8.6*  GFRNONAA >60 >60  ANIONGAP 16* 12    No results for input(s): PROT, ALBUMIN , AST, ALT,  ALKPHOS, BILITOT in the last 168 hours. Hematology Recent Labs  Lab 10/19/23 1138 10/20/23 0351  WBC 4.7 4.0   RBC 5.23 4.64  HGB 14.8 13.3  HCT 45.7 40.2  MCV 87.4 86.6  MCH 28.3 28.7  MCHC 32.4 33.1  RDW 14.6 14.6  PLT 141* 143*   Cardiac EnzymesNo results for input(s): TROPONINI in the last 168 hours. No results for input(s): TROPIPOC in the last 168 hours.  BNPNo results for input(s): BNP, PROBNP in the last 168 hours.  DDimer No results for input(s): DDIMER in the last 168 hours.  Radiology/Studies:  CT HEAD WO CONTRAST ( ) Result Date: 10/19/2023 IMPRESSION: Normal head CT.  No acute intracranial abnormality. Electronically Signed   By: Morene Hoard M.D.   On: 10/19/2023 19:42   Portable Chest 1 View Result Date: 10/19/2023 IMPRESSION: No active disease. Electronically Signed   By: Ree Molt M.D.   On: 10/19/2023 15:26    Assessment and Plan:   1. Sinus pause/symptomatic bradycardia:  -Discontinue metoprolol , last dose approximately 8:30 AM on 10/19/2023 -Will need to monitor off beta-blocker  -Pacer pads to bedside  -Patient will need EP evaluation on 2/10 for consideration of PPM for symptomatic bradycardia, sinus pause, and potential tachybradycardia syndrome, when back in the hospital  -It is unclear if this is solely post micturition syncope exacerbated by beta-blocker, or evidence of underlying sinus node dysfunction -Concern for ongoing tachybradycardia syndrome which ultimately may need PPM -Monitor on telemetry  -Echo pending  -Obtain magnesium and TSH  -At this time, no emergent need for venous temp wire or emergent PPM -Hemodynamically stable -Tamiflu  can be associated with the bradycardia, may need to weigh risk versus benefit profile  2. Atrial flutter:  -Overall burden of 3% on most recent interrogation  -Metoprolol  held as above, concern for subsequent tachycardic events with ongoing a flutter burden  -Ongoing management per EP  -Remains on apixaban  5 mg twice daily  -CHADS2VASc 3 (HTN, DM, vascular disease)   3. Nonobstructive CAD:   -High-sensitivity troponin negative x 2  -PTA apixaban  in place of aspirin  -PTA rosuvastatin  10 mg  -No plans for inpatient ischemic evaluation at this time   4. HTN:  -Blood pressure well-controlled   5. HLD:  -Rosuvastatin  as above   6.  Hypokalemia: -Replete to goal 4.0 -Likely in the setting of diarrhea    For questions or updates, please contact [facility] Please consult [email or web address] for contact info under Cardiology/STEMI.   Signed, Bernardino Bring, PA-C [facility] Pager: [phone] 10/20/2023, 8:24 AM

## 2023-10-20 NOTE — ED Notes (Signed)
 RN called for labs, have informed phlebotomist Megan twice. Called her at [phone]. If not completed please call her directly.

## 2023-10-20 NOTE — ED Notes (Signed)
 Pt ambulated to bathroom w/1 assist, steady gait.

## 2023-10-20 NOTE — Progress Notes (Signed)
*  PRELIMINARY RESULTS* Echocardiogram 2D Echocardiogram has been performed.  Dustin Fletcher [name] 10/20/2023, 10:54 AM

## 2023-10-20 NOTE — Evaluation (Signed)
 Occupational Therapy Evaluation Patient Details Name: Dustin Fletcher MRN: [id number] DOB: April 03, 1961 Today's Date: 10/20/2023   History of Present Illness Dustin Fletcher is a 63 y.o. male with a hx of paroxysmal atrial flutter status post ablation on 08/17/2023 on Lopressor  and apixaban , DM2, HTN, and HLD who is being seen today for the evaluation of syncope with associated sinus pause of 6.1 seconds. Patient tested positive for influenza A outpatient on 10/18/2023.   Clinical Impression   Dustin Fletcher presents with generalized weakness and describes recent dizziness; denies any dizziness at present. 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 Pt able to ambulate in room this date without imbalance or need for assistance. Pt and wife state that they do not anticipate need for skilled OT services; pt could benefit, however, from mobility tech care while hospitalized, to encourage physical activity prior to DC.      If plan is discharge home, recommend the following:      Functional Status Assessment  Patient has had a recent decline in their functional status and demonstrates the ability to make significant improvements in function in a reasonable and predictable amount of time.  Equipment Recommendations       Recommendations for Other Services       Precautions / Restrictions Precautions Precautions: Fall Restrictions Weight Bearing Restrictions Per Provider Order: No      Mobility Bed Mobility Overal bed mobility: Modified Independent                  Transfers Overall transfer level: Modified independent                 General transfer comment: SUPV for safety given recent syncopal event, but pt required no physical assistance      Balance Overall balance assessment: Modified Independent                                         ADL  either performed or assessed with clinical judgement   ADL Overall ADL's : Modified independent                                             Vision         Perception         Praxis         Pertinent Vitals/Pain Pain Assessment Pain Assessment: No/denies pain     Extremity/Trunk Assessment Upper Extremity Assessment Upper Extremity Assessment: Overall WFL for tasks assessed   Lower Extremity Assessment Lower Extremity Assessment: Overall WFL for tasks assessed       Communication Communication Communication: No apparent difficulties   Cognition Arousal: Alert Behavior During Therapy: WFL for tasks assessed/performed Overall Cognitive Status: Within Functional Limits for tasks assessed                                       General Comments       Exercises Other Exercises Other Exercises: Educ w/ pt and spouse re: role of OT; signs and symptoms of dehydration, importance of hydrating   Shoulder Instructions  Home Living Family/patient expects to be discharged to:: Private residence Living Arrangements: Spouse/significant other Available Help at Discharge: Family;Available 24 hours/day Type of Home: House Home Access: Stairs to enter [employer] of Steps: 3   Home Layout: One level               Home Equipment: None          Prior Functioning/Environment Prior Level of Function : Independent/Modified Independent             Mobility Comments: ambulates w/o AD ADLs Comments: IND in B/IADL, 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 [employer]        OT Problem List: Cardiopulmonary status limiting activity;Obesity      OT Treatment/Interventions:      OT Goals(Current goals can be found in the care plan section) Acute Rehab OT Goals Patient Stated Goal: to be able to work at [employer] next weekend OT Goal Formulation: With patient Time For Goal Achievement: 11/03/23 Potential to  Achieve Goals: Good  OT Frequency:      Co-evaluation              AM-PAC OT 6 Clicks Daily Activity     Outcome Measure Help from another person eating meals?: None Help from another person taking care of personal grooming?: None Help from another person toileting, which includes using toliet, bedpan, or urinal?: None Help from another person bathing (including washing, rinsing, drying)?: None Help from another person to put on and taking off regular upper body clothing?: None Help from another person to put on and taking off regular lower body clothing?: None 6 Click Score: 24   End of Session    Activity Tolerance: Patient tolerated treatment well Patient left: in bed;with call bell/phone within reach;with family/visitor present  OT Visit Diagnosis: Muscle weakness (generalized) (M62.81);Dizziness and giddiness (R42)                Time: 8887-8874 OT Time Calculation (min): 13 min Charges:  OT General Charges $OT Visit: 1 Visit OT Evaluation $OT Eval Low Complexity: 1 Low Suzen Hock, PhD, MS, OTR/L 10/20/23, 12:52 PM

## 2023-10-20 NOTE — ED Notes (Signed)
 Pacemaker placed at bedside.

## 2023-10-20 NOTE — Progress Notes (Signed)
 PROGRESS NOTE    Dustin Fletcher  FMW:[id number] DOB: [birth year] DOA: 10/19/2023 PCP: Dineen Rollene MATSU, [name]  No chief complaint on file.   Hospital Course:  Dustin Fletcher is 63 y.o. male with non-insulin -dependent diabetes, hypertension, paroxysmal A-fib on Eliquis , atrial flutter status post ablation in December 2024, hyperlipidemia, CAD, who presents to the ED complaining of syncope.  Patient reports that he has been feeling unwell for the last week and saw his doctor in 2/6 when he tested positive for influenza A.  Patient was prescribed Tamiflu  which she has been taking as prescribed.  He has had episodes of diarrhea accompanied with urgency.  He then had 2 syncopal episodes on 2/7.  He reported his first syncopal episode he was standing and urinating and then he found himself on the floor.  His wife received a phone call from loop recorder company reporting that he had been asystole episode at the same time of his his syncopal episode.  Reportedly asystole lasted for 6 to 8 seconds at 5:26 AM.   On arrival to the ED vital signs were within normal limits.  No significant lab abnormalities on CMP or CBC.  High-sensitivity troponin was 10.  He received 1 L fluid bolus  Subjective: Patient reports he is feeling much better.  He is anxious to go home.  We discussed cardiology plan.   Objective: Vitals:   10/20/23 0300 10/20/23 0351 10/20/23 0607 10/20/23 0612  BP: 120/73   128/72  Pulse: 77   77  Resp: 13   18  Temp:  97.6 F (36.4 C) 97.6 F (36.4 C)   TempSrc:  Oral    SpO2: 97%   98%  Weight:      Height:        Intake/Output Summary (Last 24 hours) at 10/20/2023 0817 Last data filed at 10/20/2023 9342 Gross per 24 hour  Intake 1956.22 ml  Output --  Net 1956.22 ml   Filed Weights   10/19/23 1132  Weight: 122.5 kg    Examination: General exam: Appears calm and comfortable, NAD  Respiratory system: No work of breathing, symmetric chest wall  expansion Cardiovascular system: S1 & S2 heard, RRR.  Gastrointestinal system: Abdomen is nondistended, soft and nontender.  Neuro: Alert and oriented. No focal neurological deficits. Extremities: Symmetric, expected ROM Skin: No rashes, lesions Psychiatry: Demonstrates appropriate judgement and insight. Mood & affect appropriate for situation.   Assessment & Plan:  Principal Problem:   Syncope Active Problems:   Hypertension   DM type 2, controlled, with complication (HCC)   Hyperlipidemia   Diabetic peripheral neuropathy (HCC)   Osteoarthritis of right knee   Peripheral neuropathy   Atrial flutter (HCC)   Paroxysmal atrial fibrillation (HCC)   Gastroenteritis   Asystole (HCC)    Syncope - Appears to be cardiogenic secondary to symptomatic bradycardia vs sinus pauses. - May also be complicated by orthostatic hypotension in setting of dehydration from influenza A - Cardiology has been consulted, recommending patient remain in house until 2/10 at which time he will be evaluated by EP - Continue telemetry with pacer leads bedside - UA noninfectious. - Procalcitonin less than 1 - Echo: LVEF 60 to 65%, no regional wall motion abnormalities, moderate left ventricular hypertrophy of the basal-septal segment.  No valvular dysfunction noted - MRSA PCR negative - Chest x-ray unremarkable - CT head without acute abnormality - Trend labs - Status post 1 L fluid bolus  Back pain, acute - Patient reports he twisted  his back when getting CT. - Scheduled Tylenol , baclofen , lidocaine  patch.  As needed Norco -- PT/OT  Asystole/Sinus pauses/Bradycardia? - As noted by implanted cardiac loop recorder - Troponin 10 -> 11->11.  No chest pain. - Continue telemetry monitoring - Cardiology has been consulted - See above - Discontinue Tamiflu  as this can cause bradycardia and may be contributing. - Hold beta-blockers  Gastroenteritis - Secondary to influenza A, may be exacerbated by  Tamiflu  (discontinued) - Status post 1 L fluid bolus, will continue with maintenance IV fluids  Paroxysmal A-fib - On Eliquis , continue - Rate controlled on metoprolol , hold for now given bradycardia  A flutter - Status post ablation December 2024  Hyperlipidemia - Continue statin  Type 2 diabetes, controlled - Continue sliding scale insulin , titrate as needed  Hypertension - Continue hydralazine    DVT prophylaxis: eliquis    Code Status: Full Code Family Communication:  Discussed directly with patient and his wife at bedside Disposition:  Inpatient. still hospitalized for tele monitoring and cardiology work up, will discharge to home when cleared by specialist teams  Consultants:  Treatment Team:  Consulting Physician: Zenaida Morene PARAS, MD  Procedures:    Antimicrobials:  Anti-infectives (From admission, onward)    Start     Dose/Rate Route Frequency Ordered Stop   10/19/23 2200  oseltamivir  (TAMIFLU ) capsule 75 mg        75 mg Oral 2 times daily 10/19/23 1436 10/24/23 0959       Data Reviewed: I have personally reviewed following labs and imaging studies CBC: Recent Labs  Lab 10/19/23 1138 10/20/23 0351  WBC 4.7 4.0  HGB 14.8 13.3  HCT 45.7 40.2  MCV 87.4 86.6  PLT 141* 143*   Basic Metabolic Panel: Recent Labs  Lab 10/19/23 1138 10/20/23 0351  NA 137 140  K 3.4* 3.3*  CL 104 104  CO2 17* 24  GLUCOSE 163* 97  BUN 24* 24*  CREATININE 1.03 0.82  CALCIUM  8.3* 8.6*   GFR: Estimated Creatinine Clearance: 129.9 mL/min (by C-G formula based on SCr of 0.82 mg/dL). Liver Function Tests: No results for input(s): AST, ALT, ALKPHOS, BILITOT, PROT, ALBUMIN  in the last 168 hours. CBG: Recent Labs  Lab 10/19/23 2237 10/20/23 0736  GLUCAP 98 99    Recent Results (from the past 240 hours)  MRSA Next Gen by PCR, Nasal     Status: None   Collection Time: 10/19/23  8:15 PM   Specimen: Nasal Mucosa; Nasal Swab  Result Value Ref Range  Status   MRSA by PCR Next Gen NOT DETECTED NOT DETECTED Final    Comment: (NOTE) The GeneXpert MRSA Assay (FDA approved for NASAL specimens only), is one component of a comprehensive MRSA colonization surveillance program. It is not intended to diagnose MRSA infection nor to guide or monitor treatment for MRSA infections. Test performance is not FDA approved in patients less than 30 years old. Performed at [facility], [address] [place], [place] [address]      Radiology Studies: CT HEAD WO CONTRAST ( ) Result Date: 10/19/2023 CLINICAL DATA:  Initial evaluation for acute syncope/presyncope, CVA suspected. EXAM: CT HEAD WITHOUT CONTRAST TECHNIQUE: Contiguous axial images were obtained from the base of the skull through the vertex without intravenous contrast. RADIATION DOSE REDUCTION: This exam was performed according to the departmental dose-optimization program which includes automated exposure control, adjustment of the mA and/or kV according to patient size and/or use of iterative reconstruction technique. COMPARISON:  Prior MRI from 11/25/2014. FINDINGS: Brain:  Cerebral volume within normal limits for patient age. No acute intracranial hemorrhage. No acute large vessel territory infarct. No mass lesion, midline shift, or mass effect. Ventricles are normal in size without hydrocephalus. No extra-axial fluid collection. Vascular: No abnormal hyperdense vessel.Scattered vascular calcifications noted within the carotid siphons. Skull: Scalp soft tissues demonstrate no acute abnormality. Calvarium intact. Sinuses/Orbits: Globes and orbital soft tissues within normal limits. Mild scattered mucosal thickening noted about the sphenoid ethmoidal and maxillary sinuses. No mastoid effusion. IMPRESSION: Normal head CT.  No acute intracranial abnormality. Electronically Signed   By: Morene Hoard M.D.   On: 10/19/2023 19:42   Portable Chest 1 View Result Date: 10/19/2023 CLINICAL  DATA:  106001 Syncope 106001 142094 Dyspnea on exertion 142094 EXAM: PORTABLE CHEST 1 VIEW COMPARISON:  10/24/2021. FINDINGS: Low lung volume. Bilateral lung fields are clear. Bilateral costophrenic angles are clear. Normal cardio-mediastinal silhouette. Loop recorder device noted overlying the left lower chest. No acute osseous abnormalities. The soft tissues are within normal limits. IMPRESSION: No active disease. Electronically Signed   By: Ree Molt M.D.   On: 10/19/2023 15:26    Scheduled Meds:  apixaban   5 mg Oral BID   cholecalciferol   1,000 Units Oral BID   cyanocobalamin   1,000 mcg Oral Daily   insulin  aspart  0-15 Units Subcutaneous TID WC   insulin  aspart  0-5 Units Subcutaneous QHS   oseltamivir   75 mg Oral BID   rosuvastatin   10 mg Oral QHS   sodium chloride  flush  3 mL Intravenous Q12H   Continuous Infusions:  lactated ringers  125 mL/hr at 10/20/23 0607     LOS: 0 days    Total time spent coordinating care:   Lorane Poland, DO [facility]  To contact the attending physician between 7A-7P please use Epic Chat. To contact the covering physician during after hours 7P-7A, please review Amion.   10/20/2023, 8:17 AM   *This document has been created with the assistance of dictation software. Please excuse typographical errors. *

## 2023-10-20 NOTE — Evaluation (Signed)
 Physical Therapy Evaluation Patient Details Name: Dustin Fletcher MRN: [id number] DOB: 1960/10/11 Today's Date: 10/20/2023  History of Present Illness  Dustin Fletcher is a 63 y.o. male with a hx of paroxysmal atrial flutter status post ablation on 08/17/2023 on Lopressor  and apixaban , DM2, HTN, and HLD who is being seen today for the evaluation of syncope with associated sinus pause of 6.1 seconds. Patient tested positive for influenza A outpatient on 10/18/2023. Patient will need EP evaluation on 2/10 for consideration of PPM for symptomatic bradycardia, sinus pause, and potential tachybradycardia syndrome, per cardiology consult recommendations  Clinical Impression  63 yo Male brought to ED after episodes of syncope from sinus pause. Patient was living at home with his wife and reports being mod I for all ADLs/self care activities. He was walking without assistive device. He is very active and assists with [employer]. Patient reports history of chronic back pain. He reports increased back pain during PT evaluation as it was time for his medication. PT asked patient about cause of back pain but patient did not elaborate. Currently he required mod A +2 for bed mobility and min A +1 for sit to stand transfer due to back pain. He ambulated in room with CGA with heavy guarding and slow unsteady walking. After walking 20 feet, he was able to increase speed and demonstrate a better reciprocal gait pattern. Concerned about high risk for falls given increased back pain and guarding as well as recent syncope events. Patient would benefit from skilled PT intervention in the acute setting to improve position changes and postural support as well as to improve mobility.         If plan is discharge home, recommend the following: A little help with walking and/or transfers;A little help with bathing/dressing/bathroom;Assistance with cooking/housework;Help with stairs or ramp for entrance   Can travel by  private vehicle        Equipment Recommendations None recommended by PT  Recommendations for Other Services       Functional Status Assessment Patient has had a recent decline in their functional status and demonstrates the ability to make significant improvements in function in a reasonable and predictable amount of time.     Precautions / Restrictions Precautions Precautions: Fall Restrictions Weight Bearing Restrictions Per Provider Order: No      Mobility  Bed Mobility Overal bed mobility: Needs Assistance Bed Mobility: Supine to Sit, Sit to Supine     Supine to sit: Mod assist, +2 for physical assistance, Used rails, HOB elevated Sit to supine: Supervision, Used rails, HOB elevated   General bed mobility comments: Pt heavily guarded and required increased assistance secondary to severe back pain. Required extra time and effort; slow to slide legs off bed    Transfers Overall transfer level: Needs assistance Equipment used: None Transfers: Sit to/from Stand Sit to Stand: Min assist           General transfer comment: Required minimal physical assistance, extra time/effort to transfer sit to stand due to increased back pain; Patient refused donning of gait belt due to back discomfort;    Ambulation/Gait Ambulation/Gait assistance: Contact guard assist, Supervision Gait Distance (Feet): 200 Feet Assistive device: IV Pole   Gait velocity: variable     General Gait Details: Pt initially required CGA and was very slow, heavily guarded with unsteadiness ambulating in room. After walking approximately 20 feet, his speed improved and he demonstrated better reciprocal gait pattern  Stairs  Wheelchair Mobility     Tilt Bed    Modified Rankin (Stroke Patients Only)       Balance Overall balance assessment: Mild deficits observed, not formally tested                                           Pertinent Vitals/Pain Pain  Assessment Pain Assessment: 0-10 Pain Score: 5  Pain Location: low back Pain Descriptors / Indicators: Aching, Sore Pain Intervention(s): Limited activity within patient's tolerance, Monitored during session, Repositioned    Home Living Family/patient expects to be discharged to:: Private residence Living Arrangements: Spouse/significant other Available Help at Discharge: Family;Available 24 hours/day Type of Home: House Home Access: Stairs to enter;Ramped entrance Entrance Stairs-Rails: Can reach both;Left;Right (has railing around deck, can reach both sides) Entrance Stairs-Number of Steps: 3   Home Layout: One level Home Equipment: None;Shower seat - built in;Hand held shower head;Grab bars - tub/shower      Prior Function Prior Level of Function : Independent/Modified Independent             Mobility Comments: ambulates w/o AD ADLs Comments: IND in B/IADL, 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 [employer]     Extremity/Trunk Assessment   Upper Extremity Assessment Upper Extremity Assessment: Overall WFL for tasks assessed    Lower Extremity Assessment Lower Extremity Assessment: Generalized weakness    Cervical / Trunk Assessment Cervical / Trunk Assessment: Other exceptions Cervical / Trunk Exceptions: severe back pain limiting transition supine<>sit and sit<>Stand; curvature is WFL;  Communication   Communication Communication: No apparent difficulties  Cognition Arousal: Alert Behavior During Therapy: WFL for tasks assessed/performed Overall Cognitive Status: Within Functional Limits for tasks assessed                                          General Comments General comments (skin integrity, edema, etc.): Pt has pain patch on low back;    Exercises Other Exercises Other Exercises: Educated patient/spouse regarding role of PT; Recommend patient slowly increase activity as tolerated including sitting up in bedside chair to improve circulation  and pulmonary function. Explained concerns regarding limited mobility due to increased back pain and high risk for falls given syncope.   Assessment/Plan    PT Assessment Patient needs continued PT services  PT Problem List Decreased strength;Cardiopulmonary status limiting activity;Pain;Decreased activity tolerance;Decreased balance;Decreased mobility       PT Treatment Interventions Balance training;Gait training;Stair training;Functional mobility training;Therapeutic activities;Therapeutic exercise;Patient/family education    PT Goals (Current goals can be found in the Care Plan section)  Acute Rehab PT Goals Patient Stated Goal: to get better PT Goal Formulation: With patient Time For Goal Achievement: 11/03/23 Potential to Achieve Goals: Good    Frequency Min 1X/week     Co-evaluation               AM-PAC PT 6 Clicks Mobility  Outcome Measure Help needed turning from your back to your side while in a flat bed without using bedrails?: A Lot Help needed moving from lying on your back to sitting on the side of a flat bed without using bedrails?: A Lot Help needed moving to and from a bed to a chair (including a wheelchair)?: A Lot Help needed standing up from a chair using your arms (  e.g., wheelchair or bedside chair)?: A Lot Help needed to walk in hospital room?: A Little Help needed climbing 3-5 steps with a railing? : A Little 6 Click Score: 14    End of Session Equipment Utilized During Treatment: Gait belt Activity Tolerance: Patient limited by pain Patient left: in bed;with call bell/phone within reach;with nursing/sitter in room;with family/visitor present Nurse Communication: Mobility status PT Visit Diagnosis: Unsteadiness on feet (R26.81);Other abnormalities of gait and mobility (R26.89);Pain Pain - part of body:  (back)    Time: 8464-8440 PT Time Calculation (min) (ACUTE ONLY): 24 min   Charges:   PT Evaluation $PT Eval Low Complexity: 1 Low   PT  General Charges $$ ACUTE PT VISIT: 1 Visit          Jaimon Bugaj PT, DPT 10/20/2023, 4:39 PM

## 2023-10-21 DIAGNOSIS — R55 Syncope and collapse: Secondary | ICD-10-CM | POA: Diagnosis not present

## 2023-10-21 DIAGNOSIS — I469 Cardiac arrest, cause unspecified: Secondary | ICD-10-CM | POA: Diagnosis not present

## 2023-10-21 DIAGNOSIS — G6289 Other specified polyneuropathies: Secondary | ICD-10-CM | POA: Diagnosis not present

## 2023-10-21 DIAGNOSIS — E7800 Pure hypercholesterolemia, unspecified: Secondary | ICD-10-CM | POA: Diagnosis not present

## 2023-10-21 DIAGNOSIS — K529 Noninfective gastroenteritis and colitis, unspecified: Secondary | ICD-10-CM

## 2023-10-21 DIAGNOSIS — I4892 Unspecified atrial flutter: Secondary | ICD-10-CM | POA: Diagnosis not present

## 2023-10-21 DIAGNOSIS — I480 Paroxysmal atrial fibrillation: Secondary | ICD-10-CM

## 2023-10-21 DIAGNOSIS — I10 Essential (primary) hypertension: Secondary | ICD-10-CM | POA: Diagnosis not present

## 2023-10-21 LAB — CBC
HCT: 45.4 % (ref 39.0–52.0)
Hemoglobin: 15.1 g/dL (ref 13.0–17.0)
MCH: 28.6 pg (ref 26.0–34.0)
MCHC: 33.3 g/dL (ref 30.0–36.0)
MCV: 86 fL (ref 80.0–100.0)
Platelets: 137 10*3/uL — ABNORMAL LOW (ref 150–400)
RBC: 5.28 MIL/uL (ref 4.22–5.81)
RDW: 14.4 % (ref 11.5–15.5)
WBC: 4.3 10*3/uL (ref 4.0–10.5)
nRBC: 0 % (ref 0.0–0.2)

## 2023-10-21 LAB — BASIC METABOLIC PANEL
Anion gap: 15 (ref 5–15)
BUN: 15 mg/dL (ref 8–23)
CO2: 20 mmol/L — ABNORMAL LOW (ref 22–32)
Calcium: 8.7 mg/dL — ABNORMAL LOW (ref 8.9–10.3)
Chloride: 104 mmol/L (ref 98–111)
Creatinine, Ser: 0.84 mg/dL (ref 0.61–1.24)
GFR, Estimated: >60 mL/min (ref >60)
Glucose, Bld: 138 mg/dL — ABNORMAL HIGH (ref 70–99)
Potassium: 3.4 mmol/L — ABNORMAL LOW (ref 3.5–5.1)
Sodium: 139 mmol/L (ref 135–145)

## 2023-10-21 LAB — GLUCOSE, CAPILLARY
Glucose-Capillary: 121 mg/dL — ABNORMAL HIGH (ref 70–99)
Glucose-Capillary: 95 mg/dL (ref 70–99)
Glucose-Capillary: 137 mg/dL — ABNORMAL HIGH (ref 70–99)
Glucose-Capillary: 150 mg/dL — ABNORMAL HIGH (ref 70–99)
Glucose-Capillary: 136 mg/dL — ABNORMAL HIGH (ref 70–99)

## 2023-10-21 MED ORDER — POTASSIUM CHLORIDE CRYS ER 20 MEQ PO TBCR
40.0000 meq | EXTENDED_RELEASE_TABLET | Freq: Once | ORAL | Status: AC
  Administered 2023-10-21: 40 meq via ORAL
  Filled 2023-10-21: qty 2

## 2023-10-21 NOTE — Progress Notes (Signed)
 PROGRESS NOTE    Dustin Fletcher  FMW:[id number] DOB: 1960-12-09 DOA: 10/19/2023 PCP: Dineen Rollene MATSU, [name]  No chief complaint on file.   Hospital Course:  Dustin Fletcher is 63 y.o. male with non-insulin -dependent diabetes, hypertension, paroxysmal A-fib on Eliquis , atrial flutter status post ablation in December 2024, hyperlipidemia, CAD, who presents to the ED complaining of syncope.  Patient reports that he has been feeling unwell for the last week and saw his doctor in 2/6 when he tested positive for influenza A.  Patient was prescribed Tamiflu  which she has been taking as prescribed.  He has had episodes of diarrhea accompanied with urgency.  He then had 2 syncopal episodes on 2/7.  He reported his first syncopal episode he was standing and urinating and then he found himself on the floor.  His wife received a phone call from loop recorder company reporting that he had been asystole episode at the same time of his his syncopal episode.  Reportedly asystole lasted for 6 to 8 seconds at 5:26 AM.   On arrival to the ED vital signs were within normal limits.  No significant lab abnormalities on CMP or CBC.  High-sensitivity troponin was 10.  He received 1 L fluid bolus  Subjective: Tele overnight reveals intermittent afib with controlled ventricular response. On evaluation today patient has no complaints    Objective: Vitals:   10/20/23 2329 10/21/23 0414 10/21/23 0439 10/21/23 0814  BP: 107/77 115/66  120/82  Pulse: 90 78  82  Resp: 20 19    Temp: 97.7 F (36.5 C) 98.3 F (36.8 C)  98.5 F (36.9 C)  TempSrc: Oral Oral    SpO2: 96% 97%  97%  Weight:   117.9 kg   Height:        Intake/Output Summary (Last 24 hours) at 10/21/2023 0932 Last data filed at 10/20/2023 1807 Gross per 24 hour  Intake 457.9 ml  Output --  Net 457.9 ml   Filed Weights   10/19/23 1132 10/20/23 1716 10/21/23 0439  Weight: 122.5 kg 114.6 kg 117.9 kg    Examination: General exam: Appears calm  and comfortable, NAD  Respiratory system: No work of breathing, symmetric chest wall expansion Cardiovascular system: S1 & S2 heard, RRR.  Gastrointestinal system: Abdomen is nondistended, soft and nontender.  Neuro: Alert and oriented. No focal neurological deficits. Extremities: Symmetric, expected ROM Skin: No rashes, lesions Psychiatry: Demonstrates appropriate judgement and insight. Mood & affect appropriate for situation.   Assessment & Plan:  Principal Problem:   Syncope Active Problems:   Hypertension   DM type 2, controlled, with complication (HCC)   Hyperlipidemia   Diabetic peripheral neuropathy (HCC)   Osteoarthritis of right knee   Peripheral neuropathy   Atrial flutter (HCC)   Paroxysmal atrial fibrillation (HCC)   Gastroenteritis   Asystole (HCC)   Sick sinus syndrome (HCC)    Syncope - Appears to be cardiogenic secondary to symptomatic bradycardia vs tachybrady syndrome - As noted by implanted cardiac loop recorder - Troponin 10 -> 11->11.  No chest pain. - Discontinue Tamiflu  as this can cause bradycardia and may be contributing. - Hold beta-blockers - May also be complicated by orthostatic hypotension in setting of dehydration from influenza A - Cardiology has been consulted, recommending patient remain in house until 2/10 at which time he will be evaluated by EP - Continue telemetry with pacer leads bedside - UA noninfectious. - Procalcitonin less than 1 - Echo: LVEF 60 to 65%, no regional wall  motion abnormalities, moderate left ventricular hypertrophy of the basal-septal segment.  No valvular dysfunction noted - MRSA PCR negative - Chest x-ray unremarkable - CT head without acute abnormality - Trend labs - Status post 1 L fluid bolus  Back pain, acute - Patient reports he twisted his back when getting CT. - Scheduled Tylenol , baclofen , lidocaine  patch.  As needed Norco -- PT/OT  Gastroenteritis - Secondary to influenza A, may be exacerbated by  Tamiflu  (discontinued) - Status post 1 L fluid bolus, will continue with maintenance IV fluids  Paroxysmal A-fib - On Eliquis , continue - Rate controlled on metoprolol , hold for now given bradycardia  A flutter - Status post ablation December 2024  Hyperlipidemia - Continue statin  Type 2 diabetes, controlled - Continue sliding scale insulin , titrate as needed  Hypertension - Continue hydralazine    DVT prophylaxis: eliquis    Code Status: Full Code Family Communication:  Discussed directly with patient and his wife at bedside Disposition:  Inpatient. still hospitalized for tele monitoring and cardiology work up, will discharge to home when cleared by specialist teams  Consultants:  Treatment Team:  Consulting Physician: Zenaida Morene PARAS, MD  Procedures:    Antimicrobials:  Anti-infectives (From admission, onward)    Start     Dose/Rate Route Frequency Ordered Stop   10/19/23 2200  oseltamivir  (TAMIFLU ) capsule 75 mg  Status:  Discontinued        75 mg Oral 2 times daily 10/19/23 1436 10/20/23 1615       Data Reviewed: I have personally reviewed following labs and imaging studies CBC: Recent Labs  Lab 10/19/23 1138 10/20/23 0351  WBC 4.7 4.0  HGB 14.8 13.3  HCT 45.7 40.2  MCV 87.4 86.6  PLT 141* 143*   Basic Metabolic Panel: Recent Labs  Lab 10/19/23 1138 10/20/23 0351  NA 137 140  K 3.4* 3.3*  CL 104 104  CO2 17* 24  GLUCOSE 163* 97  BUN 24* 24*  CREATININE 1.03 0.82  CALCIUM  8.3* 8.6*  MG  --  1.9   GFR: Estimated Creatinine Clearance: 127.5 mL/min (by C-G formula based on SCr of 0.82 mg/dL). Liver Function Tests: No results for input(s): AST, ALT, ALKPHOS, BILITOT, PROT, ALBUMIN  in the last 168 hours. CBG: Recent Labs  Lab 10/20/23 1310 10/20/23 1712 10/20/23 2139 10/21/23 0415 10/21/23 0812  GLUCAP 131* 112* 135* 121* 95    Recent Results (from the past 240 hours)  MRSA Next Gen by PCR, Nasal     Status: None    Collection Time: 10/19/23  8:15 PM   Specimen: Nasal Mucosa; Nasal Swab  Result Value Ref Range Status   MRSA by PCR Next Gen NOT DETECTED NOT DETECTED Final    Comment: (NOTE) The GeneXpert MRSA Assay (FDA approved for NASAL specimens only), is one component of a comprehensive MRSA colonization surveillance program. It is not intended to diagnose MRSA infection nor to guide or monitor treatment for MRSA infections. Test performance is not FDA approved in patients less than 25 years old. Performed at [facility], [address] [place], [place] [address]      Radiology Studies: ECHOCARDIOGRAM COMPLETE Result Date: 10/20/2023    ECHOCARDIOGRAM REPORT   Patient Name:   ANGELDEJESUS CALLAHAM Date of Exam: 10/20/2023 Medical Rec #:  [id number]          Height:       74.0 in Accession #:    [id number]         Weight:  270.0 lb Date of Birth:  1961/05/17          BSA:          2.469 m Patient Age:    62 years           BP:           128/72 mmHg Patient Gender: M                  HR:           82 bpm. Exam Location:  [facility] Procedure: 2D Echo, Cardiac Doppler, Color Doppler and Intracardiac            Opacification Agent Indications:     Dyspnea R06.00  History:         Patient has prior history of Echocardiogram examinations. Risk                  Factors:Hypertension.  Sonographer:     Bari Roar Referring Phys:  [id number] AMY N COX Diagnosing Phys: Wilbert Bihari MD IMPRESSIONS  1. Left ventricular ejection fraction, by estimation, is 60 to 65%. The left ventricle has normal function. The left ventricle has no regional wall motion abnormalities. There is moderate left ventricular hypertrophy of the basal-septal segment. Left ventricular diastolic parameters were normal.  2. Right ventricular systolic function is normal. The right ventricular size is normal.  3. The mitral valve is normal in structure. No evidence of mitral valve regurgitation. No evidence of mitral stenosis.  4. The aortic valve  is tricuspid. Aortic valve regurgitation is not visualized. Aortic valve sclerosis/calcification is present, without any evidence of aortic stenosis. Aortic valve area, by VTI measures 3.44 cm. Aortic valve mean gradient measures 2.0 mmHg. Aortic valve Vmax measures 0.97 m/s. FINDINGS  Left Ventricle: Left ventricular ejection fraction, by estimation, is 60 to 65%. The left ventricle has normal function. The left ventricle has no regional wall motion abnormalities. Definity  contrast agent was given IV to delineate the left ventricular  endocardial borders. The left ventricular internal cavity size was normal in size. There is moderate left ventricular hypertrophy of the basal-septal segment. Left ventricular diastolic parameters were normal. Normal left ventricular filling pressure. Right Ventricle: The right ventricular size is normal. No increase in right ventricular wall thickness. Right ventricular systolic function is normal. Left Atrium: Left atrial size was normal in size. Right Atrium: Right atrial size was normal in size. Pericardium: There is no evidence of pericardial effusion. Mitral Valve: The mitral valve is normal in structure. No evidence of mitral valve regurgitation. No evidence of mitral valve stenosis. MV peak gradient, 2.7 mmHg. The mean mitral valve gradient is 2.0 mmHg. Tricuspid Valve: The tricuspid valve is normal in structure. Tricuspid valve regurgitation is trivial. No evidence of tricuspid stenosis. Aortic Valve: The aortic valve is tricuspid. Aortic valve regurgitation is not visualized. Aortic valve sclerosis/calcification is present, without any evidence of aortic stenosis. Aortic valve mean gradient measures 2.0 mmHg. Aortic valve peak gradient measures 3.7 mmHg. Aortic valve area, by VTI measures 3.44 cm. Pulmonic Valve: The pulmonic valve was normal in structure. Pulmonic valve regurgitation is trivial. No evidence of pulmonic stenosis. Aorta: The aortic root is normal in size  and structure. Venous: The inferior vena cava was not well visualized. IAS/Shunts: No atrial level shunt detected by color flow Doppler.  LEFT VENTRICLE PLAX 2D LVIDd:         4.70 cm   Diastology LVIDs:  3.10 cm   LV e' medial:    10.80 cm/s LV PW:         1.10 cm   LV E/e' medial:  6.4 LV IVS:        1.30 cm   LV e' lateral:   14.70 cm/s LVOT diam:     2.10 cm   LV E/e' lateral: 4.7 LV SV:         59 LV SV Index:   24 LVOT Area:     3.46 cm  RIGHT VENTRICLE RV Basal diam:  3.20 cm RV Mid diam:    3.30 cm RV S prime:     16.60 cm/s TAPSE (M-mode): 2.8 cm LEFT ATRIUM             Index        RIGHT ATRIUM           Index LA diam:        3.70 cm 1.50 cm/m   RA Area:     16.40 cm LA Vol (A2C):   49.6 ml 20.09 ml/m  RA Volume:   41.20 ml  16.69 ml/m LA Vol (A4C):   54.4 ml 22.03 ml/m LA Biplane Vol: 52.3 ml 21.18 ml/m  AORTIC VALVE                    PULMONIC VALVE AV Area (Vmax):    2.94 cm     PV Vmax:          1.22 m/s AV Area (Vmean):   3.37 cm     PV Peak grad:     6.0 mmHg AV Area (VTI):     3.44 cm     PR End Diast Vel: 6.45 msec AV Vmax:           96.80 cm/s   RVOT Peak grad:   2 mmHg AV Vmean:          62.500 cm/s AV VTI:            0.170 m AV Peak Grad:      3.7 mmHg AV Mean Grad:      2.0 mmHg LVOT Vmax:         82.20 cm/s LVOT Vmean:        60.800 cm/s LVOT VTI:          0.169 m LVOT/AV VTI ratio: 0.99  AORTA Ao Root diam: 3.50 cm Ao Asc diam:  3.50 cm MITRAL VALVE MV Area (PHT): 4.60 cm    SHUNTS MV Area VTI:   3.63 cm    Systemic VTI:  0.17 m MV Peak grad:  2.7 mmHg    Systemic Diam: 2.10 cm MV Mean grad:  2.0 mmHg MV Vmax:       0.82 m/s MV Vmean:      59.7 cm/s MV Decel Time: 165 msec MV E velocity: 69.20 cm/s MV A velocity: 61.80 cm/s MV E/A ratio:  1.12 MV A Prime:    14.4 cm/s Wilbert Bihari MD Electronically signed by Wilbert Bihari MD Signature Date/Time: 10/20/2023/3:16:20 PM    Final    CT HEAD WO CONTRAST ( ) Result Date: 10/19/2023 CLINICAL DATA:  Initial evaluation for acute  syncope/presyncope, CVA suspected. EXAM: CT HEAD WITHOUT CONTRAST TECHNIQUE: Contiguous axial images were obtained from the base of the skull through the vertex without intravenous contrast. RADIATION DOSE REDUCTION: This exam was performed according to the departmental dose-optimization program which includes automated exposure control, adjustment of the  mA and/or kV according to patient size and/or use of iterative reconstruction technique. COMPARISON:  Prior MRI from 11/25/2014. FINDINGS: Brain: Cerebral volume within normal limits for patient age. No acute intracranial hemorrhage. No acute large vessel territory infarct. No mass lesion, midline shift, or mass effect. Ventricles are normal in size without hydrocephalus. No extra-axial fluid collection. Vascular: No abnormal hyperdense vessel.Scattered vascular calcifications noted within the carotid siphons. Skull: Scalp soft tissues demonstrate no acute abnormality. Calvarium intact. Sinuses/Orbits: Globes and orbital soft tissues within normal limits. Mild scattered mucosal thickening noted about the sphenoid ethmoidal and maxillary sinuses. No mastoid effusion. IMPRESSION: Normal head CT.  No acute intracranial abnormality. Electronically Signed   By: Morene Hoard M.D.   On: 10/19/2023 19:42   Portable Chest 1 View Result Date: 10/19/2023 CLINICAL DATA:  106001 Syncope 106001 [id number] Dyspnea on exertion [id number] EXAM: PORTABLE CHEST 1 VIEW COMPARISON:  10/24/2021. FINDINGS: Low lung volume. Bilateral lung fields are clear. Bilateral costophrenic angles are clear. Normal cardio-mediastinal silhouette. Loop recorder device noted overlying the left lower chest. No acute osseous abnormalities. The soft tissues are within normal limits. IMPRESSION: No active disease. Electronically Signed   By: Ree Molt M.D.   On: 10/19/2023 15:26    Scheduled Meds:  acetaminophen   650 mg Oral Q6H   apixaban   5 mg Oral BID   baclofen   10 mg Oral TID    cholecalciferol   1,000 Units Oral BID   cyanocobalamin   1,000 mcg Oral Daily   dextromethorphan -guaiFENesin   1 tablet Oral BID   insulin  aspart  0-15 Units Subcutaneous TID WC   insulin  aspart  0-5 Units Subcutaneous QHS   lidocaine   1 patch Transdermal Q24H   rosuvastatin   10 mg Oral QHS   sodium chloride  flush  3 mL Intravenous Q12H   Continuous Infusions:     LOS: 1 day    Total time spent coordinating care:   Lorane Poland, DO [facility]  To contact the attending physician between 7A-7P please use Epic Chat. To contact the covering physician during after hours 7P-7A, please review Amion.   10/21/2023, 9:32 AM   *This document has been created with the assistance of dictation software. Please excuse typographical errors. *

## 2023-10-21 NOTE — Progress Notes (Signed)
 Progress Note  Patient Name: Dustin Fletcher Date of Encounter: 10/21/2023  Primary Cardiologist: Darron Primary Electrophysiologist: Cindie   Subjective   Developed Afib on the evening of 2/8 a little after 20:00 with rates in the 80s to low 100s bpm, remains in rate controlled Afib this morning. No chest pain, dyspnea, palpitations, dizziness, presyncope, or syncope.   Inpatient Medications    Scheduled Meds:  acetaminophen   650 mg Oral Q6H   apixaban   5 mg Oral BID   baclofen   10 mg Oral TID   cholecalciferol   1,000 Units Oral BID   cyanocobalamin   1,000 mcg Oral Daily   dextromethorphan -guaiFENesin   1 tablet Oral BID   insulin  aspart  0-15 Units Subcutaneous TID WC   insulin  aspart  0-5 Units Subcutaneous QHS   lidocaine   1 patch Transdermal Q24H   rosuvastatin   10 mg Oral QHS   sodium chloride  flush  3 mL Intravenous Q12H   Continuous Infusions:  PRN Meds: hydrALAZINE , HYDROcodone -acetaminophen , ondansetron  **OR** ondansetron  (ZOFRAN ) IV, senna-docusate, sodium chloride    Vital Signs    Vitals:   10/20/23 2021 10/20/23 2329 10/21/23 0414 10/21/23 0439  BP: 128/75 107/77 115/66   Pulse: 87 90 78   Resp: 20 20 19    Temp: 98.2 F (36.8 C) 97.7 F (36.5 C) 98.3 F (36.8 C)   TempSrc: Oral Oral Oral   SpO2: 96% 96% 97%   Weight:    117.9 kg  Height:        Intake/Output Summary (Last 24 hours) at 10/21/2023 0808 Last data filed at 10/20/2023 1807 Gross per 24 hour  Intake 457.9 ml  Output --  Net 457.9 ml   Filed Weights   10/19/23 1132 10/20/23 1716 10/21/23 0439  Weight: 122.5 kg 114.6 kg 117.9 kg    Telemetry    Developed Afib with ventricular rates in the 80s to low 100s bpm around 20:00 on 2/8, remains in Afib at this time with rates in the 80s bpm - Personally Reviewed  ECG    No new tracings - Personally Reviewed  Physical Exam   GEN: No acute distress.   Neck: No JVD. Cardiac: IRIR, no murmurs, rubs, or gallops.  Respiratory: Clear to  auscultation bilaterally.  GI: Soft, nontender, non-distended.   MS: No edema; No deformity. Neuro:  Alert and oriented x 3; Nonfocal.  Psych: Normal affect.  Labs    Chemistry Recent Labs  Lab 10/19/23 1138 10/20/23 0351  NA 137 140  K 3.4* 3.3*  CL 104 104  CO2 17* 24  GLUCOSE 163* 97  BUN 24* 24*  CREATININE 1.03 0.82  CALCIUM  8.3* 8.6*  GFRNONAA >60 >60  ANIONGAP 16* 12     Hematology Recent Labs  Lab 10/19/23 1138 10/20/23 0351  WBC 4.7 4.0  RBC 5.23 4.64  HGB 14.8 13.3  HCT 45.7 40.2  MCV 87.4 86.6  MCH 28.3 28.7  MCHC 32.4 33.1  RDW 14.6 14.6  PLT 141* 143*    Cardiac EnzymesNo results for input(s): TROPONINI in the last 168 hours. No results for input(s): TROPIPOC in the last 168 hours.   BNPNo results for input(s): BNP, PROBNP in the last 168 hours.   DDimer No results for input(s): DDIMER in the last 168 hours.   Radiology    CT HEAD WO CONTRAST ( ) Result Date: 10/19/2023 IMPRESSION: Normal head CT.  No acute intracranial abnormality. Electronically Signed   By: Morene Hoard M.D.   On: 10/19/2023 19:42  Portable Chest 1 View Result Date: 10/19/2023 IMPRESSION: No active disease. Electronically Signed   By: Ree Molt M.D.   On: 10/19/2023 15:26    Cardiac Studies   2D echo 10/20/2023: 1. Left ventricular ejection fraction, by estimation, is 60 to 65%. The  left ventricle has normal function. The left ventricle has no regional  wall motion abnormalities. There is moderate left ventricular hypertrophy  of the basal-septal segment. Left  ventricular diastolic parameters were normal.   2. Right ventricular systolic function is normal. The right ventricular  size is normal.   3. The mitral valve is normal in structure. No evidence of mitral valve  regurgitation. No evidence of mitral stenosis.   4. The aortic valve is tricuspid. Aortic valve regurgitation is not  visualized. Aortic valve sclerosis/calcification is  present, without any  evidence of aortic stenosis. Aortic valve area, by VTI measures 3.44 cm.  Aortic valve mean gradient measures  2.0 mmHg. Aortic valve Vmax measures 0.97 m/s.  __________  ILR interrogation 10/19/2023: ILR alert for pause Event occurred 2/7 @ 05:34, duration 6.1sec per device, escape beat, SB in the 20's.   Route to triage high alert per protocol Hx of AF, burden 3%, Eliquis  per PA report __________   Coronary CTA 03/26/2023: IMPRESSION: 1. Coronary calcium  score of 410. This was 87th percentile for age and sex matched control.   2. Normal coronary origin with right dominance.   3. Mild proximal RCA stenosis (25-49%).   4. Minimal proximal LAD stenosis (<25%).   5. CAD-RADS 2. Mild non-obstructive CAD (25-49%). Consider non-atherosclerotic causes of chest pain. Consider preventive therapy and risk factor modification. __________   2D echo 04/05/2022: 1. Left ventricular ejection fraction, by estimation, is 60 to 65%. The  left ventricle has normal function. The left ventricle has no regional  wall motion abnormalities. There is mild left ventricular hypertrophy.  Left ventricular diastolic parameters  are consistent with Grade I diastolic dysfunction (impaired relaxation).   2. Right ventricular systolic function is normal. The right ventricular  size is mildly enlarged. There is normal pulmonary artery systolic  pressure.   3. Right atrial size was mildly dilated.   4. The mitral valve is normal in structure. No evidence of mitral valve  regurgitation. No evidence of mitral stenosis.   5. The aortic valve is tricuspid. There is mild thickening of the aortic  valve. Aortic valve regurgitation is not visualized. Aortic valve  sclerosis is present, with no evidence of aortic valve stenosis.   6. Aortic dilatation noted. There is borderline dilatation of the aortic  root, measuring 38 mm.   7. The inferior vena cava is normal in size with greater than 50%   respiratory variability, suggesting right atrial pressure of 3 mmHg.   Patient Profile     63 y.o. male with history of paroxysmal atrial flutter status post ablation on 08/17/2023 on Lopressor  and apixaban , DM2, HTN, and HLD who is being seen today for the evaluation of syncope with associated sinus pause of 6.1 seconds at the request of Dr. Sherre.   Assessment & Plan    1. Sinus pause/symptomatic bradycardia:  -Continue to hold metoprolol , last dose approximately 8:30 AM on 10/19/2023 -Will need to monitor off beta-blocker  -Pacer pads to bedside  -Patient will need EP evaluation on 2/10 for consideration of PPM for symptomatic bradycardia, sinus pause, and potential tachybradycardia syndrome, when back in the hospital  -It is unclear if this is solely post micturition syncope exacerbated  by beta-blocker, or evidence of underlying sinus node dysfunction -Concern for ongoing tachybradycardia syndrome which ultimately may need PPM -Monitor on telemetry  -Echo with preserved LVSF as above  -TSH and magnesium normal -At this time, no emergent need for venous temp wire or emergent PPM -Hemodynamically stable -Tamiflu  can be associated with bradycardia and was held   2. Atrial flutter/fib:  -Overall burden of 3% on most recent interrogation  -Metoprolol  held as above, concern for subsequent tachycardic events with ongoing a flutter burden  -Developed rate controlled Afib on the evening of 2/8 -Ongoing management per EP, to round on the patient 2/10  -Remains on apixaban  5 mg twice daily  -CHADS2VASc 3 (HTN, DM, vascular disease)    3. Nonobstructive CAD:  -High-sensitivity troponin negative x 2  -PTA apixaban  in place of aspirin  -PTA rosuvastatin  10 mg  -No plans for inpatient ischemic evaluation at this time   4. HTN:  -Blood pressure well-controlled   5. HLD:  -Rosuvastatin  as above    6.  Hypokalemia: -Replete to goal 4.0 -Likely in the setting of diarrhea -Check BMP       For questions or updates, please contact [facility] Please consult [email or web address] for contact info under Cardiology/STEMI.    Signed, Bernardino Bring, PA-C [facility] Pager: [phone] 10/21/2023, 8:08 AM

## 2023-10-21 NOTE — Plan of Care (Signed)
  Problem: Education: Goal: Ability to describe self-care measures that may prevent or decrease complications (Diabetes Survival Skills Education) will improve Outcome: Progressing   Problem: Education: Goal: Ability to describe self-care measures that may prevent or decrease complications (Diabetes Survival Skills Education) will improve Outcome: Progressing   Problem: Coping: Goal: Ability to adjust to condition or change in health will improve Outcome: Progressing   Problem: Metabolic: Goal: Ability to maintain appropriate glucose levels will improve Outcome: Progressing   Problem: Education: Goal: Knowledge of condition and prescribed therapy will improve Outcome: Progressing   Problem: Cardiac: Goal: Will achieve and/or maintain adequate cardiac output Outcome: Progressing   Problem: Cardiac: Goal: Will achieve and/or maintain adequate cardiac output Outcome: Progressing   Problem: Clinical Measurements: Goal: Cardiovascular complication will be avoided Outcome: Progressing   Problem: Coping: Goal: Level of anxiety will decrease Outcome: Progressing

## 2023-10-21 NOTE — Progress Notes (Addendum)
 Mobility Specialist - Progress Note    10/21/23 1530  Mobility  Activity Ambulated independently in hallway;Stood at bedside  Level of Assistance Modified independent, requires aide device or extra time  Assistive Device Front wheel walker  Distance Ambulated (ft) 240 ft  Range of Motion/Exercises Active  Activity Response Tolerated well  Mobility Referral Yes  Mobility visit 1 Mobility   Pt resting in bed on RA upon entry. Pt preferred to get out of bed from an elavated position due to back pain. Pt exited bed safely ModI. Pt STS and ambulates to hallway around NS ModI utilizing raling in hallway. Pt returned to bed and left with needs in reach.   Guido Rumble Mobility Specialist 10/21/23, 3:33 PM

## 2023-10-22 DIAGNOSIS — R55 Syncope and collapse: Secondary | ICD-10-CM | POA: Diagnosis not present

## 2023-10-22 DIAGNOSIS — I469 Cardiac arrest, cause unspecified: Secondary | ICD-10-CM | POA: Diagnosis not present

## 2023-10-22 DIAGNOSIS — I4892 Unspecified atrial flutter: Secondary | ICD-10-CM

## 2023-10-22 LAB — GLUCOSE, CAPILLARY
Glucose-Capillary: 125 mg/dL — ABNORMAL HIGH (ref 70–99)
Glucose-Capillary: 131 mg/dL — ABNORMAL HIGH (ref 70–99)
Glucose-Capillary: 161 mg/dL — ABNORMAL HIGH (ref 70–99)

## 2023-10-22 LAB — CUP PACEART REMOTE DEVICE CHECK
Date Time Interrogation Session: 20250207060300
Implantable Pulse Generator Implant Date: 20241206
Pulse Gen Serial Number: 131450

## 2023-10-22 MED ORDER — BACLOFEN 10 MG PO TABS: 5.0000 mg | ORAL_TABLET | Freq: Three times a day (TID) | ORAL | 0 refills | Status: DC

## 2023-10-22 NOTE — Progress Notes (Signed)
 AVS given and reviewed with patient. PIV removed. All questions answered

## 2023-10-22 NOTE — Progress Notes (Signed)
 Rounding Note    Patient Name: Dustin Fletcher Date of Encounter: 10/22/2023  [facility] Cardiologist: Antionette Kirks, MD   Subjective   Patient is overall feel well. No SOB or chest pain. Tele shows he went from Afib> NSR 2/9 at around 6:30pm. He was not aware he was in Afib.  Inpatient Medications    Scheduled Meds:  apixaban   5 mg Oral BID   baclofen   10 mg Oral TID   cholecalciferol   1,000 Units Oral BID   cyanocobalamin   1,000 mcg Oral Daily   dextromethorphan -guaiFENesin   1 tablet Oral BID   insulin  aspart  0-15 Units Subcutaneous TID WC   insulin  aspart  0-5 Units Subcutaneous QHS   lidocaine   1 patch Transdermal Q24H   rosuvastatin   10 mg Oral QHS   sodium chloride  flush  3 mL Intravenous Q12H   Continuous Infusions:  PRN Meds: hydrALAZINE , HYDROcodone -acetaminophen , ondansetron  **OR** ondansetron  (ZOFRAN ) IV, senna-docusate, sodium chloride    Vital Signs    Vitals:   10/21/23 2239 10/22/23 0500 10/22/23 0508 10/22/23 0736  BP: 127/79  124/78 134/84  Pulse: 87  86 81  Resp: 19  20 20   Temp: 98.7 F (37.1 C)  98.4 F (36.9 C) 97.8 F (36.6 C)  TempSrc: Oral  Oral Oral  SpO2: 98%  97% 97%  Weight:  117.5 kg    Height:        Intake/Output Summary (Last 24 hours) at 10/22/2023 1009 Last data filed at 10/21/2023 1914 Gross per 24 hour  Intake 240 ml  Output --  Net 240 ml      10/22/2023    5:00 AM 10/21/2023    4:39 AM 10/20/2023    5:16 PM  Last 3 Weights  Weight (lbs) 259 lb 0.7 oz 259 lb 14.8 oz 252 lb 10.4 oz  Weight (kg) 117.5 kg 117.9 kg 114.6 kg      Telemetry    Afib>NSR 80-90s - Personally Reviewed  ECG    No new - Personally Reviewed  Physical Exam   GEN: No acute distress.   Neck: No JVD Cardiac: RRR, no murmurs, rubs, or gallops.  Respiratory: Clear to auscultation bilaterally. GI: Soft, nontender, non-distended  MS: No edema; No deformity. Neuro:  Nonfocal  Psych: Normal affect   Labs    High Sensitivity  Troponin:   Recent Labs  Lab 10/19/23 1138 10/19/23 1404  TROPONINIHS 10 11     Chemistry Recent Labs  Lab 10/19/23 1138 10/20/23 0351 10/21/23 0947  NA 137 140 139  K 3.4* 3.3* 3.4*  CL 104 104 104  CO2 17* 24 20*  GLUCOSE 163* 97 138*  BUN 24* 24* 15  CREATININE 1.03 0.82 0.84  CALCIUM  8.3* 8.6* 8.7*  MG  --  1.9  --   GFRNONAA >60 >60 >60  ANIONGAP 16* 12 15    Lipids No results for input(s): "CHOL", "TRIG", "HDL", "LABVLDL", "LDLCALC", "CHOLHDL" in the last 168 hours.  Hematology Recent Labs  Lab 10/19/23 1138 10/20/23 0351 10/21/23 0947  WBC 4.7 4.0 4.3  RBC 5.23 4.64 5.28  HGB 14.8 13.3 15.1  HCT 45.7 40.2 45.4  MCV 87.4 86.6 86.0  MCH 28.3 28.7 28.6  MCHC 32.4 33.1 33.3  RDW 14.6 14.6 14.4  PLT 141* 143* 137*   Thyroid  Recent Labs  Lab 10/20/23 0351  TSH 3.145    BNPNo results for input(s): "BNP", "PROBNP" in the last 168 hours.  DDimer No results for input(s): "  DDIMER" in the last 168 hours.   Radiology    No results found.  Cardiac Studies   2D echo 10/20/2023: 1. Left ventricular ejection fraction, by estimation, is 60 to 65%. The  left ventricle has normal function. The left ventricle has no regional  wall motion abnormalities. There is moderate left ventricular hypertrophy  of the basal-septal segment. Left  ventricular diastolic parameters were normal.   2. Right ventricular systolic function is normal. The right ventricular  size is normal.   3. The mitral valve is normal in structure. No evidence of mitral valve  regurgitation. No evidence of mitral stenosis.   4. The aortic valve is tricuspid. Aortic valve regurgitation is not  visualized. Aortic valve sclerosis/calcification is present, without any  evidence of aortic stenosis. Aortic valve area, by VTI measures 3.44 cm.  Aortic valve mean gradient measures  2.0 mmHg. Aortic valve Vmax measures 0.97 m/s.  __________   ILR interrogation 10/19/2023: ILR alert for pause Event  occurred 2/7 @ 05:34, duration 6.1sec per device, escape beat, SB in the 20's.   Route to triage high alert per protocol Hx of AF, burden 3%, Eliquis  per PA report __________   Coronary CTA 03/26/2023: IMPRESSION: 1. Coronary calcium  score of 410. This was 87th percentile for age and sex matched control.   2. Normal coronary origin with right dominance.   3. Mild proximal RCA stenosis (25-49%).   4. Minimal proximal LAD stenosis (<25%).   5. CAD-RADS 2. Mild non-obstructive CAD (25-49%). Consider non-atherosclerotic causes of chest pain. Consider preventive therapy and risk factor modification. __________   2D echo 04/05/2022: 1. Left ventricular ejection fraction, by estimation, is 60 to 65%. The  left ventricle has normal function. The left ventricle has no regional  wall motion abnormalities. There is mild left ventricular hypertrophy.  Left ventricular diastolic parameters  are consistent with Grade I diastolic dysfunction (impaired relaxation).   2. Right ventricular systolic function is normal. The right ventricular  size is mildly enlarged. There is normal pulmonary artery systolic  pressure.   3. Right atrial size was mildly dilated.   4. The mitral valve is normal in structure. No evidence of mitral valve  regurgitation. No evidence of mitral stenosis.   5. The aortic valve is tricuspid. There is mild thickening of the aortic  valve. Aortic valve regurgitation is not visualized. Aortic valve  sclerosis is present, with no evidence of aortic valve stenosis.   6. Aortic dilatation noted. There is borderline dilatation of the aortic  root, measuring 38 mm.   7. The inferior vena cava is normal in size with greater than 50%  respiratory variability, suggesting right atrial pressure of 3 mmHg.   Patient Profile     63 y.o. male  with history of paroxysmal atrial flutter status post ablation on 08/17/2023 on Lopressor  and apixaban , DM2, HTN, and HLD who is being seen today  for the evaluation of syncope with associated sinus pause of 6.1 seconds at the request of Dr. Reinhold Carbine.   Assessment & Plan    Syncope Sinus pause/symptomatic bradycardia - first episode occurred while urinating>>ILR showed 6.1 second pause around that time with HR in the 20s and afib burden of 3%. - second episode occurred after he was asleep for 3 hours and got up and felt dizzy - he was diagnosed with the Flu 2/6, may be contributing to symptoms - metoprolol  held, last dose was 10/19/23 at ~ 8:30 AM - tele shows Afib HR 70s>NSR on  2/9 around 6:30 pm. Remains in NSR. 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  - EP saw via televisit. Plan to continue to monitor symptoms. Suspect syncope may be from vasovagal/orthostatic in the setting of dehydration d/t with flu. Wait to restart BB as OP.  Atrial flutter/fib - overall 3% burden on most recent investigation - BB held as above - went into Afib 2/8, overall rates controlled - continue Eliquis  5mg  BID - CHADSVASC 3 (HTN, DM, vascular disease)  Nonobstructive CAD - HS trop negative x2 - Cardiac CTA 03/2023 showed nonobstructive CAD - PTA Eliquis  in place of ASA - PTA Crestor  10mg  daily - no plans for inpatient work-up at this time.   HTN - BP well controlled  Hypokalemia - Keep K>4  For questions or updates, please contact [facility] Please consult [email or web address] for contact info under        Signed, Kevan Prouty Rebekah Canada, PA-C  10/22/2023, 10:09 AM

## 2023-10-22 NOTE — Discharge Summary (Signed)
 Physician Discharge Summary   Patient: Dustin Fletcher MRN: [id number] DOB: [birth year]  Admit date:     10/19/2023  Discharge date: 10/22/23  Discharge Physician: Roise Cleaver   PCP: Calista Catching, [name]   Recommendations at discharge:   Follow-up closely with cardiology and EP  Discharge Diagnoses: Principal Problem:   Syncope Active Problems:   Hypertension   DM type 2, controlled, with complication (HCC)   Hyperlipidemia   Diabetic peripheral neuropathy (HCC)   Osteoarthritis of right knee   Peripheral neuropathy   Atrial flutter (HCC)   Paroxysmal atrial fibrillation (HCC)   Gastroenteritis   Asystole (HCC)   Sick sinus syndrome (HCC)  Resolved Problems:   * No resolved hospital problems. [facility]: Dustin Fletcher is a 63 year old male with non-insulin -dependent diabetes, hypertension, paroxysmal A-fib on Eliquis , atrial flutter status post ablation in December 2024, with implanted loop recorder, hyperlipidemia, CAD, who presented to the ED after a syncopal episode.  Patient went to his PCP on 2/6 and tested positive for influenza.  He was initiated on Tamiflu  which he has been taking as prescribed.  He was also suffering from diarrhea.  He then had 2 syncopal episodes on 2/7.  ILR company reported to the patient's wife that he had a 6.1-second pause with escape beat, sinus bradycardia in the 20s at the time of this event.  He was brought to the ED.  On arrival in the ED vital signs were within normal limits and labs revealed a slightly elevated creatinine above his baseline.  Troponin was trended 10 -> 11-> 11. Cardiology was consulted.  All beta-blockers and Tamiflu  were discontinued due to worsening bradycardia.  Patient was monitored on telemetry.  He was noted to have intermittent runs of A-fib but no further pauses.  EP was also consulted and requested the patient remain in house on telemetry over the weekend until evaluation on 2/10.  On 2/10 EP recommended  the patient be discharged and follow-up with the clinic with continued monitoring.  Echocardiogram was performed which revealed LVEF 60 to 65%, no regional wall motion abnormalities, no valvular dysfunction. Patient reported feeling back to his physiologic baseline at time of discharge.    ILR interrogation 10/19/2023: ILR alert for pause Event occurred 2/7 @ 05:34, duration 6.1sec per device, escape beat, SB in the 20's.   Route to triage high alert per protocol Hx of AF, burden 3%, Eliquis  per PA report   Assessment and Plan: Syncope - Appears to be cardiogenic secondary to symptomatic bradycardia vs tachybrady syndrome vs orthostasis from dehydration - As noted by implanted cardiac loop recorder - Troponin 10 -> 11->11.  No chest pain. - Discontinued Tamiflu  as this can cause bradycardia and may be contributing. - Hold beta-blockers - May also be complicated by orthostatic hypotension in setting of dehydration from influenza A - Cardiology has been consulted, seen by EP who recommends outpt monitoring and no PPM for now. - UA noninfectious. - Procalcitonin less than 1 - Echo: LVEF 60 to 65%, no regional wall motion abnormalities, moderate left ventricular hypertrophy of the basal-septal segment.  No valvular dysfunction noted - MRSA PCR negative - Chest x-ray unremarkable - CT head without acute abnormality - Status post 1 L fluid bolus   Back pain, acute - Patient reports he twisted his back when getting CT. - Scheduled Tylenol , baclofen , lidocaine  patch.   -- PT/OT   Gastroenteritis - Secondary to influenza A, may be exacerbated by Tamiflu  (discontinued) - Status post  1 L fluid bolus, will continue with maintenance IV fluids   Paroxysmal A-fib - On Eliquis , continue - Rate controlled on metoprolol , hold for now given bradycardia   A flutter - Status post ablation December 2024   Hyperlipidemia - Continue statin   Type 2 diabetes, controlled - Continue sliding scale  insulin , titrate as needed   Hypertension - Continue hydralazine       Consultants: Cardiology, Electrophysiology Procedures performed: n/a  Disposition: Home Diet recommendation:  Discharge Diet Orders (From admission, onward)     Start     Ordered   10/22/23 0000  Diet general        10/22/23 1446           Cardiac diet DISCHARGE MEDICATION: Allergies as of 10/22/2023   No Known Allergies      Medication List     STOP taking these medications    acetaminophen  500 MG tablet Commonly known as: TYLENOL    metoprolol  tartrate 25 MG tablet Commonly known as: LOPRESSOR    oseltamivir  75 MG capsule Commonly known as: Tamiflu        TAKE these medications    baclofen  10 MG tablet Commonly known as: LIORESAL  Take 0.5 tablets (5 mg total) by mouth 3 (three) times daily.   blood glucose meter kit and supplies Kit Dispense based on patient and insurance preference. Use up to four times daily as directed. (FOR ICD-9 250.00, 250.01).   cholecalciferol  25 MCG (1000 UNIT) tablet Commonly known as: VITAMIN D3 Take 1,000 Units by mouth 2 (two) times daily.   cyanocobalamin  1000 MCG tablet Commonly known as: VITAMIN B12 Take 1,000 mcg by mouth daily.   ELDERBERRY PO Take 1 tablet by mouth 2 (two) times daily.   Eliquis  5 MG Tabs tablet Generic drug: apixaban  TAKE 1 TABLET BY MOUTH TWICE A DAY   Fish Oil 1000 MG Caps Take 1,000-2,000 mg by mouth See admin instructions. Take 2000 mg in morning and 1000 mg at bedtime   rosuvastatin  10 MG tablet Commonly known as: CRESTOR  Take 1 tablet (10 mg total) by mouth daily.   [name]  14 MG Tabs Generic drug: Semaglutide  TAKE 1 TABLET BY MOUTH DAILY   Xigduo  XR 01-999 MG Tb24 Generic drug: Dapagliflozin  Pro-metFORMIN  ER Take 2 tablets by mouth every morning.        Discharge Exam: Filed Weights   10/20/23 1716 10/21/23 0439 10/22/23 0500  Weight: 114.6 kg 117.9 kg 117.5 kg   Constitutional:  Normal  appearance. Non toxic-appearing.  HENT: Head Normocephalic and atraumatic.  Mucous membranes are moist.  Eyes:  Extraocular intact. Conjunctivae normal. Pupils are equal, round, and reactive to light.  Cardiovascular: Rate and Rhythm: Normal rate and regular rhythm.  Pulmonary: Non labored, symmetric rise of chest wall.  Musculoskeletal:  Normal range of motion.  Skin: warm and dry. not jaundiced.  Neurological: No focal deficit present. alert. Oriented. Psychiatric: Mood and Affect congruent.    Condition at discharge: stable  The results of significant diagnostics from this hospitalization (including imaging, microbiology, ancillary and laboratory) are listed below for reference.   Imaging Studies: CUP PACEART REMOTE DEVICE CHECK Result Date: 10/22/2023 ILR alert for pause Event occurred 2/7 @ 05:34, duration 6.1sec per device, escape beat, SB in the 20's.  Route to triage high alert per protocol Hx of AF, burden 3%, Eliquis  per PA report Symptom events for "Heart racing, fluttering", EGM's c/w ST LA, CVRS SEE PHONE ENC, MWRN.1 yellow event alert  ECHOCARDIOGRAM COMPLETE Result Date: 10/20/2023  ECHOCARDIOGRAM REPORT   Patient Name:   Dustin Fletcher Date of Exam: 10/20/2023 Medical Rec #:  [id number]          Height:       74.0 in Accession #:    [id number]         Weight:       270.0 lb Date of Birth:  1961-06-19          BSA:          2.469 m Patient Age:    62 years           BP:           128/72 mmHg Patient Gender: M                  HR:           82 bpm. Exam Location:  [facility] Procedure: 2D Echo, Cardiac Doppler, Color Doppler and Intracardiac            Opacification Agent Indications:     Dyspnea R06.00  History:         Patient has prior history of Echocardiogram examinations. Risk                  Factors:Hypertension.  Sonographer:     Kathaleen Pale Roar Referring Phys:  [id number] AMY N COX Diagnosing Phys: Gaylyn Keas MD IMPRESSIONS  1. Left ventricular ejection fraction, by estimation, is  60 to 65%. The left ventricle has normal function. The left ventricle has no regional wall motion abnormalities. There is moderate left ventricular hypertrophy of the basal-septal segment. Left ventricular diastolic parameters were normal.  2. Right ventricular systolic function is normal. The right ventricular size is normal.  3. The mitral valve is normal in structure. No evidence of mitral valve regurgitation. No evidence of mitral stenosis.  4. The aortic valve is tricuspid. Aortic valve regurgitation is not visualized. Aortic valve sclerosis/calcification is present, without any evidence of aortic stenosis. Aortic valve area, by VTI measures 3.44 cm. Aortic valve mean gradient measures 2.0 mmHg. Aortic valve Vmax measures 0.97 m/s. FINDINGS  Left Ventricle: Left ventricular ejection fraction, by estimation, is 60 to 65%. The left ventricle has normal function. The left ventricle has no regional wall motion abnormalities. Definity  contrast agent was given IV to delineate the left ventricular  endocardial borders. The left ventricular internal cavity size was normal in size. There is moderate left ventricular hypertrophy of the basal-septal segment. Left ventricular diastolic parameters were normal. Normal left ventricular filling pressure. Right Ventricle: The right ventricular size is normal. No increase in right ventricular wall thickness. Right ventricular systolic function is normal. Left Atrium: Left atrial size was normal in size. Right Atrium: Right atrial size was normal in size. Pericardium: There is no evidence of pericardial effusion. Mitral Valve: The mitral valve is normal in structure. No evidence of mitral valve regurgitation. No evidence of mitral valve stenosis. MV peak gradient, 2.7 mmHg. The mean mitral valve gradient is 2.0 mmHg. Tricuspid Valve: The tricuspid valve is normal in structure. Tricuspid valve regurgitation is trivial. No evidence of tricuspid stenosis. Aortic Valve: The aortic  valve is tricuspid. Aortic valve regurgitation is not visualized. Aortic valve sclerosis/calcification is present, without any evidence of aortic stenosis. Aortic valve mean gradient measures 2.0 mmHg. Aortic valve peak gradient measures 3.7 mmHg. Aortic valve area, by VTI measures 3.44 cm. Pulmonic Valve: The pulmonic valve was normal in structure. Pulmonic valve regurgitation is trivial. No evidence  of pulmonic stenosis. Aorta: The aortic root is normal in size and structure. Venous: The inferior vena cava was not well visualized. IAS/Shunts: No atrial level shunt detected by color flow Doppler.  LEFT VENTRICLE PLAX 2D LVIDd:         4.70 cm   Diastology LVIDs:         3.10 cm   LV e' medial:    10.80 cm/s LV PW:         1.10 cm   LV E/e' medial:  6.4 LV IVS:        1.30 cm   LV e' lateral:   14.70 cm/s LVOT diam:     2.10 cm   LV E/e' lateral: 4.7 LV SV:         59 LV SV Index:   24 LVOT Area:     3.46 cm  RIGHT VENTRICLE RV Basal diam:  3.20 cm RV Mid diam:    3.30 cm RV S prime:     16.60 cm/s TAPSE (M-mode): 2.8 cm LEFT ATRIUM             Index        RIGHT ATRIUM           Index LA diam:        3.70 cm 1.50 cm/m   RA Area:     16.40 cm LA Vol (A2C):   49.6 ml 20.09 ml/m  RA Volume:   41.20 ml  16.69 ml/m LA Vol (A4C):   54.4 ml 22.03 ml/m LA Biplane Vol: 52.3 ml 21.18 ml/m  AORTIC VALVE                    PULMONIC VALVE AV Area (Vmax):    2.94 cm     PV Vmax:          1.22 m/s AV Area (Vmean):   3.37 cm     PV Peak grad:     6.0 mmHg AV Area (VTI):     3.44 cm     PR End Diast Vel: 6.45 msec AV Vmax:           96.80 cm/s   RVOT Peak grad:   2 mmHg AV Vmean:          62.500 cm/s AV VTI:            0.170 m AV Peak Grad:      3.7 mmHg AV Mean Grad:      2.0 mmHg LVOT Vmax:         82.20 cm/s LVOT Vmean:        60.800 cm/s LVOT VTI:          0.169 m LVOT/AV VTI ratio: 0.99  AORTA Ao Root diam: 3.50 cm Ao Asc diam:  3.50 cm MITRAL VALVE MV Area (PHT): 4.60 cm    SHUNTS MV Area VTI:   3.63 cm     Systemic VTI:  0.17 m MV Peak grad:  2.7 mmHg    Systemic Diam: 2.10 cm MV Mean grad:  2.0 mmHg MV Vmax:       0.82 m/s MV Vmean:      59.7 cm/s MV Decel Time: 165 msec MV E velocity: 69.20 cm/s MV A velocity: 61.80 cm/s MV E/A ratio:  1.12 MV A Prime:    14.4 cm/s Gaylyn Keas MD Electronically signed by Gaylyn Keas MD Signature Date/Time: 10/20/2023/3:16:20 PM    Final    CT HEAD  WO CONTRAST ( ) Result Date: 10/19/2023 CLINICAL DATA:  Initial evaluation for acute syncope/presyncope, CVA suspected. EXAM: CT HEAD WITHOUT CONTRAST TECHNIQUE: Contiguous axial images were obtained from the base of the skull through the vertex without intravenous contrast. RADIATION DOSE REDUCTION: This exam was performed according to the departmental dose-optimization program which includes automated exposure control, adjustment of the mA and/or kV according to patient size and/or use of iterative reconstruction technique. COMPARISON:  Prior MRI from 11/25/2014. FINDINGS: Brain: Cerebral volume within normal limits for patient age. No acute intracranial hemorrhage. No acute large vessel territory infarct. No mass lesion, midline shift, or mass effect. Ventricles are normal in size without hydrocephalus. No extra-axial fluid collection. Vascular: No abnormal hyperdense vessel.Scattered vascular calcifications noted within the carotid siphons. Skull: Scalp soft tissues demonstrate no acute abnormality. Calvarium intact. Sinuses/Orbits: Globes and orbital soft tissues within normal limits. Mild scattered mucosal thickening noted about the sphenoid ethmoidal and maxillary sinuses. No mastoid effusion. IMPRESSION: Normal head CT.  No acute intracranial abnormality. Electronically Signed   By: Virgia Griffins M.D.   On: 10/19/2023 19:42   Portable Chest 1 View Result Date: 10/19/2023 CLINICAL DATA:  106001 Syncope 106001 [id number] Dyspnea on exertion [id number] EXAM: PORTABLE CHEST 1 VIEW COMPARISON:  10/24/2021. FINDINGS: Low lung  volume. Bilateral lung fields are clear. Bilateral costophrenic angles are clear. Normal cardio-mediastinal silhouette. Loop recorder device noted overlying the left lower chest. No acute osseous abnormalities. The soft tissues are within normal limits. IMPRESSION: No active disease. Electronically Signed   By: Beula Brunswick M.D.   On: 10/19/2023 15:26    Microbiology: Results for orders placed or performed during the hospital encounter of 10/19/23  MRSA Next Gen by PCR, Nasal     Status: None   Collection Time: 10/19/23  8:15 PM   Specimen: Nasal Mucosa; Nasal Swab  Result Value Ref Range Status   MRSA by PCR Next Gen NOT DETECTED NOT DETECTED Final    Comment: (NOTE) The GeneXpert MRSA Assay (FDA approved for NASAL specimens only), is one component of a comprehensive MRSA colonization surveillance program. It is not intended to diagnose MRSA infection nor to guide or monitor treatment for MRSA infections. Test performance is not FDA approved in patients less than 54 years old. Performed at [facility], [address] [place], [place] [address]     Labs: CBC: Recent Labs  Lab 10/19/23 1138 10/20/23 0351 10/21/23 0947  WBC 4.7 4.0 4.3  HGB 14.8 13.3 15.1  HCT 45.7 40.2 45.4  MCV 87.4 86.6 86.0  PLT 141* 143* 137*   Basic Metabolic Panel: Recent Labs  Lab 10/19/23 1138 10/20/23 0351 10/21/23 0947  NA 137 140 139  K 3.4* 3.3* 3.4*  CL 104 104 104  CO2 17* 24 20*  GLUCOSE 163* 97 138*  BUN 24* 24* 15  CREATININE 1.03 0.82 0.84  CALCIUM  8.3* 8.6* 8.7*  MG  --  1.9  --    Liver Function Tests: No results for input(s): "AST", "ALT", "ALKPHOS", "BILITOT", "PROT", "ALBUMIN " in the last 168 hours. CBG: Recent Labs  Lab 10/21/23 1653 10/21/23 2119 10/22/23 0500 10/22/23 0737 10/22/23 1139  GLUCAP 150* 136* 125* 131* 161*    Discharge time spent: greater than 30 minutes.  Signed: Xiadani Damman, DO [facility] 10/22/2023

## 2023-10-22 NOTE — Plan of Care (Signed)
  Problem: Education: Goal: Ability to describe self-care measures that may prevent or decrease complications (Diabetes Survival Skills Education) will improve Outcome: Progressing   Problem: Coping: Goal: Ability to adjust to condition or change in health will improve Outcome: Progressing   Problem: Fluid Volume: Goal: Ability to maintain a balanced intake and output will improve Outcome: Progressing   Problem: Health Behavior/Discharge Planning: Goal: Ability to identify and utilize available resources and services will improve Outcome: Progressing Goal: Ability to manage health-related needs will improve Outcome: Progressing   Problem: Nutritional: Goal: Maintenance of adequate nutrition will improve Outcome: Progressing Goal: Progress toward achieving an optimal weight will improve Outcome: Progressing

## 2023-10-22 NOTE — Consult Note (Signed)
 Electrophysiology consultation   Patient ID: Dustin Fletcher MRN: [id number]; DOB: April 13, 1961  Admit date: 10/19/2023 Date of Consult: 10/22/2023  PCP:  Calista Catching, [name]   History of Present Illness:   Dustin Fletcher is a 63 year old man who I am seeing today for an evaluation of syncope at the request of Dr. Micael Adas.  The patient is known to me from the outpatient setting.  He has a history of paroxysmal atrial flutter and underwent an atrial flutter ablation on August 17, 2023.  At the time of the ablation, loop recorder was implanted for atrial fibrillation surveillance.  Atrial fibrillation was subsequently detected on his loop recorder.  He was admitted to the hospital on October 19, 2023 after suffering 2 syncopal episodes.  The syncopal episodes occurred in the setting of influenza infection, poor p.o. intake.  He tells me today that he finished urinating and stood up.  He said he felt lightheaded and quickly lost consciousness.  About 3 hours later he got up from laying flat and sat on the side of the bed.  When he changed position he started feeling dizzy.  He fell like the room was spinning around him and he woke up on the floor.  In the emergency department EKG showed sinus rhythm.  Telemetry monitoring has shown no evidence of high degree AV block or pauses.  Loop recorder interrogation demonstrated 2 episodes of prolonged pauses at the time of his syncopal episodes.  One of the syncopal episodes corresponded to a 6.1-second pause. Today he tells me that he is feeling better and stronger.  He has been ambulating the hallways without any difficulty.  No presyncope or syncope while hospitalized.  Past medical, surgical, social and family history reviewed.  ROS:  Please see the history of present illness.  All other ROS reviewed and negative.     Physical Exam/Data:   Vitals:   10/22/23 0500 10/22/23 0508 10/22/23 0736 10/22/23 1138  BP:  124/78 134/84 132/73  Pulse:  86 81  88  Resp:  20 20 18   Temp:  98.4 F (36.9 C) 97.8 F (36.6 C) 97.6 F (36.4 C)  TempSrc:  Oral Oral Oral  SpO2:  97% 97% 97%  Weight: 117.5 kg     Height:        General:  Well nourished, well developed, in no acute distress. Psych:  Normal affect   EKG:  The EKG was personally reviewed and demonstrates: Sinus rhythm.  Narrow QRS.  No preexcitation.    Assessment and Plan:   #Syncope #Sinus pause I suspect his syncopal episode was secondary to dehydration, influenza infection and a vagal effect.  His baseline EKG shows no evidence of conduction system disease.  He has felt better after hydration.  We had a long discussion about the pathophysiology of syncope.  We discussed indications for permanent pacemaker implant.  We discussed the importance of slow transitions between body position, especially when dehydration is present.  Recommend holding metoprolol  for now, this can be restarted as an outpatient once he has completely recovered  Will continue to monitor his heart rhythm using the loop recorder.  We discussed red flag symptoms that should prompt urgent presentation to the emergency department including presyncope or repeat syncopal episode.  He should avoid driving until he has completely recovered from influenza.  #Atrial fibrillation Detected after successful atrial flutter ablation Continue Eliquis  twice daily for stroke prophylaxis    Donelda Fujita T. Marven Slimmer, MD, [facility], [facility] Cardiac Electrophysiology   --------------------------  I have seen the patient via virtual consultation today.  The patient expressed their consent to participate in today's virtual consult. The patient was located at [facility] while I was located at [facility].  Caregility was used to complete today's consult.   Total time of encounter: 55 minutes total time of encounter, including face-to-face patient care, coordination of care and counseling regarding  high complexity medical decision making re: syncope.   E4540 - ER or Initial Inpatient 50 min -GT modifier - via interactive audio and video telecommunication systems     Dustin Byes, MD 10/22/2023 7:35 PM

## 2023-10-22 NOTE — Procedures (Signed)
   10/22/23   To Whom it may concern,  Dustin Fletcher was hospitalized at [facility] from 10/19/2023 through 10/22/23.  He has been cleared to return to work on, but not before 10/26/23.  Should you have further questions please feel free to contact our office.  Be advised that no medical information will be divulged without a signed HIPAA release from the patient.    Sincerely, Collette Pescador, DO [facility] Office  [phone]

## 2023-10-22 NOTE — Plan of Care (Signed)
  Problem: Education: Goal: Ability to describe self-care measures that may prevent or decrease complications (Diabetes Survival Skills Education) will improve Outcome: Progressing Goal: Individualized Educational Video(s) Outcome: Progressing   Problem: Coping: Goal: Ability to adjust to condition or change in health will improve Outcome: Progressing   Problem: Fluid Volume: Goal: Ability to maintain a balanced intake and output will improve Outcome: Progressing   Problem: Health Behavior/Discharge Planning: Goal: Ability to identify and utilize available resources and services will improve Outcome: Progressing Goal: Ability to manage health-related needs will improve Outcome: Progressing   Problem: Metabolic: Goal: Ability to maintain appropriate glucose levels will improve Outcome: Progressing   Problem: Nutritional: Goal: Maintenance of adequate nutrition will improve Outcome: Progressing Goal: Progress toward achieving an optimal weight will improve Outcome: Progressing   Problem: Skin Integrity: Goal: Risk for impaired skin integrity will decrease Outcome: Progressing   Problem: Tissue Perfusion: Goal: Adequacy of tissue perfusion will improve Outcome: Progressing   Problem: Education: Goal: Knowledge of condition and prescribed therapy will improve Outcome: Progressing   Problem: Cardiac: Goal: Will achieve and/or maintain adequate cardiac output Outcome: Progressing   Problem: Physical Regulation: Goal: Complications related to the disease process, condition or treatment will be avoided or minimized Outcome: Progressing   Problem: Education: Goal: Knowledge of General Education information will improve Description: Including pain rating scale, medication(s)/side effects and non-pharmacologic comfort measures Outcome: Progressing   Problem: Health Behavior/Discharge Planning: Goal: Ability to manage health-related needs will improve Outcome:  Progressing   Problem: Clinical Measurements: Goal: Ability to maintain clinical measurements within normal limits will improve Outcome: Progressing Goal: Will remain free from infection Outcome: Progressing Goal: Diagnostic test results will improve Outcome: Progressing Goal: Respiratory complications will improve Outcome: Progressing Goal: Cardiovascular complication will be avoided Outcome: Progressing   Problem: Activity: Goal: Risk for activity intolerance will decrease Outcome: Progressing   Problem: Nutrition: Goal: Adequate nutrition will be maintained Outcome: Progressing   Problem: Coping: Goal: Level of anxiety will decrease Outcome: Progressing   Problem: Elimination: Goal: Will not experience complications related to bowel motility Outcome: Progressing Goal: Will not experience complications related to urinary retention Outcome: Progressing   Problem: Pain Managment: Goal: General experience of comfort will improve and/or be controlled Outcome: Progressing   Problem: Safety: Goal: Ability to remain free from injury will improve Outcome: Progressing   Problem: Skin Integrity: Goal: Risk for impaired skin integrity will decrease Outcome: Progressing

## 2023-10-23 ENCOUNTER — Encounter: Payer: Self-pay | Admitting: Cardiology

## 2023-10-23 ENCOUNTER — Other Ambulatory Visit: Payer: Self-pay | Admitting: Family

## 2023-10-23 DIAGNOSIS — E7800 Pure hypercholesterolemia, unspecified: Secondary | ICD-10-CM

## 2023-10-24 ENCOUNTER — Telehealth: Payer: Self-pay

## 2023-10-24 LAB — CUP PACEART REMOTE DEVICE CHECK
Date Time Interrogation Session: 20250211110300
Implantable Pulse Generator Implant Date: 20241206
Pulse Gen Serial Number: 131450

## 2023-10-24 NOTE — Transitions of Care (Post Inpatient/ED Visit) (Signed)
10/24/2023  Name: Dustin Fletcher MRN: [id number] DOB: 1961/01/15  Today's TOC FU Call Status: Today's TOC FU Call Status:: Successful TOC FU Call Completed TOC FU Call Complete Date: 10/24/23 Patient's Name and Date of Birth confirmed.  Transition Care Management Follow-up Telephone Call Date of Discharge: 10/22/23 Discharge Facility: [facility]) Type of Discharge: Inpatient Admission Primary Inpatient Discharge Diagnosis:: Syncope How have you been since you were released from the hospital?: Better Any questions or concerns?: No  Items Reviewed: Did you receive and understand the discharge instructions provided?: Yes Medications obtained,verified, and reconciled?: Yes (Medications Reviewed) Any new allergies since your discharge?: No Dietary orders reviewed?: NA Do you have support at home?: Yes People in Home: spouse Name of Support/Comfort Primary Source: Elnita Maxwell  Medications Reviewed Today: Medications Reviewed Today     Reviewed by Redge Gainer, RN (Case Manager) on 10/24/23 at 1124  Med List Status: <None>   Medication Order Taking? Sig Documenting Provider Last Dose Status Informant  baclofen (LIORESAL) 10 MG tablet [id number]  Take 0.5 tablets (5 mg total) by mouth 3 (three) times daily. Debarah Crape, DO  Active   blood glucose meter kit and supplies KIT [id number] No Dispense based on patient and insurance preference. Use up to four times daily as directed. (FOR ICD-9 250.00, 250.01). Allegra Grana, [name] Taking Active Self, Pharmacy Records  cholecalciferol (VITAMIN D3) 25 MCG (1000 UNIT) tablet [id number] No Take 1,000 Units by mouth 2 (two) times daily. [provider] 10/19/2023 Active Self, Pharmacy Records  Dapagliflozin Pro-metFORMIN ER (XIGDUO XR) 01-999 MG TB24 [id number] No Take 2 tablets by mouth every morning. Allegra Grana, [name] 10/19/2023 Morning Active Self, Pharmacy Records  ELDERBERRY PO [id number] No Take  1 tablet by mouth 2 (two) times daily. [provider] 10/19/2023 Active Self, Pharmacy Records  ELIQUIS 5 MG TABS tablet [id number] No TAKE 1 TABLET BY MOUTH TWICE A DAY Iran Ouch, MD 10/19/2023  8:30 AM Active Self, Pharmacy Records  Omega-3 Fatty Acids (FISH OIL) 1000 MG CAPS [id number] No Take 1,000-2,000 mg by mouth See admin instructions. Take 2000 mg in morning and 1000 mg at bedtime [provider] 10/19/2023 Active Self, Pharmacy Records           Med Note Feliz Beam, Arie Sabina   Thu Oct 14, 2020  2:59 PM)    rosuvastatin (CRESTOR) 10 MG tablet [id number] No Take 1 tablet (10 mg total) by mouth daily. Iran Ouch, MD 10/19/2023 Active Self, Pharmacy Records  [name] 14 MG TABS [id number] No TAKE 1 TABLET BY MOUTH DAILY Allegra Grana, [name] 10/19/2023 Active Self, Pharmacy Records  vitamin B-12 (CYANOCOBALAMIN) 1000 MCG tablet [id number] No Take 1,000 mcg by mouth daily. [provider] 10/19/2023 Active Self, Pharmacy Records            Home Care and Equipment/Supplies: Were Home Health Services Ordered?: NA Any new equipment or medical supplies ordered?: NA  Functional Questionnaire: Do you need assistance with bathing/showering or dressing?: No Do you need assistance with meal preparation?: No Do you need assistance with eating?: No Do you have difficulty maintaining continence: No Do you need assistance with getting out of bed/getting out of a chair/moving?: No Do you have difficulty managing or taking your medications?: No  Follow up appointments reviewed: PCP Follow-up appointment confirmed?: NA Specialist Hospital Follow-up appointment confirmed?: Yes Date of Specialist follow-up appointment?: 10/31/23 Follow-Up Specialty Provider:: Patrick North Do you need transportation to your follow-up appointment?: No  Do you understand care options if your condition(s) worsen?: Yes-patient verbalized understanding  SDOH Interventions Today     Flowsheet Row Most Recent Value  SDOH Interventions   Food Insecurity Interventions Intervention Not Indicated  Housing Interventions Intervention Not Indicated  Transportation Interventions Intervention Not Indicated  Utilities Interventions Intervention Not Indicated      Interventions Today    Flowsheet Row Most Recent Value  Chronic Disease   Chronic disease during today's visit Diabetes  General Interventions   General Interventions Discussed/Reviewed General Interventions Discussed, Doctor Visits  Doctor Visits Discussed/Reviewed Doctor Visits Discussed  Exercise Interventions   Exercise Discussed/Reviewed Physical Activity  Physical Activity Discussed/Reviewed Physical Activity Discussed  Education Interventions   Education Provided Provided Education  Provided Verbal Education On Insurance Plans, Medication, When to see the doctor  Nutrition Interventions   Nutrition Discussed/Reviewed Portion sizes  Pharmacy Interventions   Pharmacy Dicussed/Reviewed Medications and their functions  Safety Interventions   Safety Discussed/Reviewed Fall Risk       TOC Outreach to the patient today. The patient had two syncopal episodes at home which are believed to be related to dehydration from the Flu. He states he also pulled a back muscle when going to CT but that has been better since he has been at home. He will go back to work this Friday on the 14th and has a follow up with his cardiologist next week. He declines further follow up.   Deidre Ala, BSN, RN [place]  [facility] - [employer] [facility] RN Care Manager [phone]

## 2023-10-25 ENCOUNTER — Encounter: Payer: Self-pay | Admitting: Cardiology

## 2023-10-29 ENCOUNTER — Telehealth: Payer: Self-pay

## 2023-10-29 NOTE — Telephone Encounter (Signed)
Alert remote transmission: Tachy detections.  12 Tachy classified episodes o 10/26/23 between 06:29 and 07:20, longest 21 sec, ECGs c/w intermittent atrial arrhythmia with RVR,PVC ectopy and noise oversensing. Implant indication AF ablation.  Tachy detection programmed off with AF episode alert programmed off and AF burden alert programmed as 23 hrs and AT rate to 120 bpm for 6 hrs per protocol  Noted: shift in rates on histogram.  *patient was taken off his metoprolol in the hospital due to acute illness, syncope/pause events likely r/t illness,vagal response.    ? Re-evaluate rate control once patient has recovered.   Patient has appt with Sherie Don, NP on 10/31/23. Will flag for her review with patient.   LM to see how patient has been doing since discharge and ensure he is keeping his F/U appt with Suzann on 10/31/23.

## 2023-10-30 NOTE — Addendum Note (Signed)
Addended by: Geralyn Flash D on: 10/30/2023 10:56 AM   Modules accepted: Orders

## 2023-10-30 NOTE — Progress Notes (Signed)
 Bsx Loop Recorder

## 2023-10-30 NOTE — Progress Notes (Unsigned)
Electrophysiology Clinic Note    Date:  10/31/2023  Patient ID:  Dustin Fletcher, Dustin Fletcher 09/20/1960, MRN [id number] PCP:  Allegra Grana, [name]  Cardiologist:  Lorine Bears, MD Electrophysiologist: Lanier Prude, MD   Discussed the use of AI scribe software for clinical note transcription with the patient, who gave verbal consent to proceed.   Patient Profile    Chief Complaint: AFib, Aflu, syncope follow-up  History of Present Illness: Dustin Fletcher is a 63 y.o. male with PMH notable for aflutter, non-obs CAD, T2DM, HTN; seen today for Lanier Prude, MD for routine electrophysiology followup.  He is s/p aflutter ablation w CTI on 08/17/2023. He had an ILR implanted at the same time for arrhythmia monitoring.  I saw him 1 month post-ablation where ILR showed 1% AFib burden, episodes were brief in duration.  He was diagnosed with flu 2/6, and had episode of syncope x 2 with a 6 second pause detected on ILR. He presented to [facility]. He had poor PO intake. EP was consulted and thought both syncopal episodes were d/t vagal effect with dehydration iso of flu. While inpatient, he walked halls and had no conduction system abnormalities. His metop was stopped.  Device clinic was alerted earlier this week for multiple tachycardic episodes appear AFib w RVR with PVCs.   On follow-up today, he has completely recovered from the flu,  back to normal PO intake. He has had no further syncopal episodes. His main complaint is ongoing low back pain that started when he was in the hospital. It is effective his sleep at night.  He continues to take eliquis BID, no bleeding concerns.     Device Information: Bos Sci ILR, imp 08/2023; dx afib/flut mgmt  AAD History: None     ROS:  Please see the history of present illness. All other systems are reviewed and otherwise negative.    Physical Exam    VS:  BP (!) 140/80 (BP Location: Left Arm, Patient Position: Sitting, Cuff Size:  Large)   Pulse 81   Ht 6\' 2"  (1.88 m)   Wt 266 lb (120.7 kg)   SpO2 98%   BMI 34.15 kg/m  BMI: Body mass index is 34.15 kg/m.  Wt Readings from Last 3 Encounters:  10/31/23 266 lb (120.7 kg)  10/22/23 259 lb 0.7 oz (117.5 kg)  10/18/23 270 lb (122.5 kg)     GEN- The patient is well appearing, alert and oriented x 3 today.   Lungs- Clear to ausculation bilaterally, normal work of breathing.  Heart- Regular rate and rhythm, no murmurs, rubs or gallops Extremities- No peripheral edema, warm, dry Skin-  ILR site well-healed    1/29 ILR remote device interrogation reviewed by myself:  Battery good Frequent, brief aflutter episodes noted, overall  burden 1%   Studies Reviewed   Previous EP, cardiology notes.    EKG is ordered. Personal review of EKG from today shows:    EKG Interpretation Date/Time:  Wednesday October 31 2023 09:53:46 EST Ventricular Rate:  81 PR Interval:  208 QRS Duration:  102 QT Interval:  394 QTC Calculation: 457 R Axis:   1  Text Interpretation: Normal sinus rhythm Normal ECG Confirmed by Sherie Don [phone]) on 10/31/2023 10:52:43 AM    Coronary CTA, 03/26/2023 1. Coronary calcium score of 410. This was 87th percentile for age and sex matched control.   2. Normal coronary origin with right dominance.  3. Mild proximal RCA stenosis (25-49%).  4. Minimal proximal LAD stenosis (<25%).  5. CAD-RADS 2. Mild non-obstructive CAD (25-49%). Consider non-atherosclerotic causes of chest pain. Consider preventive therapy and risk factor modification.  Long term monitor, 02/22/2023 Patient had a min HR of 31 bpm, max HR of 214 bpm, and avg HR of 86 bpm. Predominant underlying rhythm was Sinus Rhythm. First Degree AV Block was present. 2 Ventricular Tachycardia runs occurred, the run with the fastest interval lasting 10 beats with a  max rate of 214 bpm (avg 179 bpm); the run with the fastest interval was also the longest. 82 Supraventricular Tachycardia runs  occurred, the run with the fastest interval lasting 6 beats with a max rate of 200 bpm, the longest lasting 23 mins 34 secs with an avg rate of 114 bpm. Atrial Flutter occurred (<1% burden), ranging from 86-168 bpm (avg of 147 bpm), the longest lasting 48 mins 3 secs with an avg rate of 147 bpm. Second Degree AV Block-Mobitz I (Wenckebach) was present. Supraventricular Tachycardia and Atrial Flutter were detected within +/- 45 seconds of symptomatic patient event(s).  Rare PACs and rare PVCs.    Assessment and Plan     #) Aflutter #) AFib #) ILR in situ S/p AFl ablation 08/17/23 Significant increase in AFib burden iso flu, burden has decreased since but likely too soon to truly eval Restart 25mg  lopressor BID  #) Hypercoag d/t aflutter  CHA2DS2-VASc Score = at least 3 [CHF History: 0, HTN History: 1, Diabetes History: 1, Stroke History: 0, Vascular Disease History: 1, Age Score: 0, Gender Score: 0].  Therefore, the patient's annual risk of stroke is 3.2 %.    Stroke ppx - 5mg  eliquis, appropriately dosed No bleeding concerns  #) vasovagal syncope #) influenza Recent episode of syncope x 2, both in setting of influenza, poor PO intake One episode did correlate with 6 second pause on ILR, determined to be vasovagal  No recurrent syncope, recovered from flu Continue to monitor with ILR Restart BB as above Recommended to proceed to ER if has recurrent syncope         Current medicines are reviewed at length with the patient today.   The patient does not have concerns regarding his medicines.  The following changes were made today:   RESTART 25mg  lopressor BID  Labs/ tests ordered today include:  Orders Placed This Encounter  Procedures   EKG 12-Lead   EKG 12-Lead     Disposition: Follow up with Dr. Lalla Brothers or EP APP  6 weeks    Signed, Sherie Don, NP  10/31/23  10:53 AM  Electrophysiology [facility]

## 2023-10-31 ENCOUNTER — Encounter: Payer: Self-pay | Admitting: Cardiology

## 2023-10-31 ENCOUNTER — Ambulatory Visit: Payer: BC Managed Care – PPO | Attending: Cardiology | Admitting: Cardiology

## 2023-10-31 VITALS — BP 140/80 | HR 81 | Ht 74.0 in | Wt 266.0 lb

## 2023-10-31 DIAGNOSIS — D6869 Other thrombophilia: Secondary | ICD-10-CM

## 2023-10-31 DIAGNOSIS — I4892 Unspecified atrial flutter: Secondary | ICD-10-CM | POA: Diagnosis not present

## 2023-10-31 DIAGNOSIS — I480 Paroxysmal atrial fibrillation: Secondary | ICD-10-CM | POA: Diagnosis not present

## 2023-10-31 DIAGNOSIS — R55 Syncope and collapse: Secondary | ICD-10-CM

## 2023-10-31 DIAGNOSIS — Z95818 Presence of other cardiac implants and grafts: Secondary | ICD-10-CM

## 2023-10-31 MED ORDER — METOPROLOL TARTRATE 25 MG PO TABS: 25.0000 mg | ORAL_TABLET | Freq: Two times a day (BID) | ORAL | 3 refills | Status: AC

## 2023-10-31 NOTE — Patient Instructions (Addendum)
Medication Instructions:  RESTARTING Metoprolol 25 mg twice daily   *If you need a refill on your cardiac medications before your next appointment, please call your pharmacy*   Follow-Up: At [facility], you and your health needs are our priority.  As part of our continuing mission to provide you with exceptional heart care, we have created designated Provider Care Teams.  These Care Teams include your primary Cardiologist (physician) and Advanced Practice Providers (APPs -  Physician Assistants and Nurse Practitioners) who all work together to provide you with the care you need, when you need it.  We recommend signing up for the patient portal called "MyChart".  Sign up information is provided on this After Visit Summary.  MyChart is used to connect with patients for Virtual Visits (Telemedicine).  Patients are able to view lab/test results, encounter notes, upcoming appointments, etc.  Non-urgent messages can be sent to your provider as well.   To learn more about what you can do with MyChart, go to [email or web address].    Your next appointment:   Keep scheduled appointment.

## 2023-11-04 ENCOUNTER — Other Ambulatory Visit: Payer: Self-pay | Admitting: Cardiovascular Disease

## 2023-11-05 ENCOUNTER — Encounter: Payer: Self-pay | Admitting: Oncology

## 2023-11-05 ENCOUNTER — Other Ambulatory Visit: Payer: Self-pay

## 2023-11-05 ENCOUNTER — Ambulatory Visit (INDEPENDENT_AMBULATORY_CARE_PROVIDER_SITE_OTHER): Payer: Self-pay | Admitting: Oncology

## 2023-11-05 VITALS — BP 120/80 | HR 104 | Temp 100.4°F | Ht 74.0 in

## 2023-11-05 DIAGNOSIS — R051 Acute cough: Secondary | ICD-10-CM

## 2023-11-05 DIAGNOSIS — J029 Acute pharyngitis, unspecified: Secondary | ICD-10-CM

## 2023-11-05 LAB — POC SOFIA 2 FLU + SARS ANTIGEN FIA
Influenza A, POC: NEGATIVE
Influenza B, POC: NEGATIVE
SARS Coronavirus 2 Ag: POSITIVE — AB

## 2023-11-05 MED ORDER — NIRMATRELVIR/RITONAVIR (PAXLOVID)TABLET: 3.0000 | ORAL_TABLET | Freq: Two times a day (BID) | ORAL | 0 refills | Status: DC

## 2023-11-05 MED ORDER — ALBUTEROL SULFATE HFA 108 (90 BASE) MCG/ACT IN AERS: 2.0000 | INHALATION_SPRAY | Freq: Four times a day (QID) | RESPIRATORY_TRACT | 1 refills | Status: DC | PRN

## 2023-11-05 MED ORDER — MOLNUPIRAVIR EUA 200MG CAPSULE: 4.0000 | ORAL_CAPSULE | Freq: Two times a day (BID) | ORAL | 0 refills | Status: AC

## 2023-11-05 NOTE — Progress Notes (Unsigned)
 [facility] [address] [place], [place] [address] Phone: [phone] Fax: [phone]   Office Visit Note  Patient Name: Dustin Fletcher  Date of [id number]  Med Rec number [id number]  Date of Service: 11/06/2023  Patient has no known allergies.  Chief Complaint  Patient presents with   Sick visit    Patient c/o runny nose, sore throat, headache, and dry cough. Symptoms began yesterday afternoon. He has been taking OTC cold/flu medication. He states his coworkers have been sick.     HPI Patient is an 63 y.o. student here for complaints of cough, achiness, cold/hot flashes, fatigue, congestion that started yesterday.  Reports he felt fine yesterday morning but then developed severe body aches, congestion and hot flashes while working.  Several of his coworkers have similar symptoms.  Reports he recently got over the flu but ended up in the hospital due to a syncopal episode thought to be secondary to dehydration from nausea/vomiting and the use of metoprolol.  He was prescribed Tamiflu.   Has history of atrial fibrillation and with loop recorder and is followed by cardiology.  Has been taking OTC cold and flu medication and Tylenol arthritis.  Last dose was this morning.  Reports he was unable to sleep through the night due to body aches and coughing.    Current Medication:  Outpatient Encounter Medications as of 11/05/2023  Medication Sig   albuterol (VENTOLIN HFA) 108 (90 Base) MCG/ACT inhaler Inhale 2 puffs into the lungs every 6 (six) hours as needed for wheezing or shortness of breath.   baclofen (LIORESAL) 10 MG tablet Take 0.5 tablets (5 mg total) by mouth 3 (three) times daily.   blood glucose meter kit and supplies KIT Dispense based on patient and insurance preference. Use up to four times daily as directed. (FOR ICD-9 250.00, 250.01).   cholecalciferol (VITAMIN D3) 25 MCG (1000 UNIT) tablet Take 1,000 Units by mouth 2 (two) times daily.   Dapagliflozin  Pro-metFORMIN ER (XIGDUO XR) 01-999 MG TB24 Take 2 tablets by mouth every morning.   ELDERBERRY PO Take 1 tablet by mouth 2 (two) times daily.   ELIQUIS 5 MG TABS tablet TAKE 1 TABLET BY MOUTH 2 TIMES A DAY   metoprolol tartrate (LOPRESSOR) 25 MG tablet Take 1 tablet (25 mg total) by mouth 2 (two) times daily.   molnupiravir EUA (LAGEVRIO) 200 mg CAPS capsule Take 4 capsules (800 mg total) by mouth 2 (two) times daily for 5 days.   Omega-3 Fatty Acids (FISH OIL) 1000 MG CAPS Take 1,000-2,000 mg by mouth See admin instructions. Take 2000 mg in morning and 1000 mg at bedtime   rosuvastatin (CRESTOR) 10 MG tablet Take 1 tablet (10 mg total) by mouth daily.   [name] 14 MG TABS TAKE 1 TABLET BY MOUTH DAILY   vitamin B-12 (CYANOCOBALAMIN) 1000 MCG tablet Take 1,000 mcg by mouth daily.   [DISCONTINUED] nirmatrelvir/ritonavir (PAXLOVID) 20 x 150 MG & 10 x 100MG  TABS Take 3 tablets by mouth 2 (two) times daily for 5 days. (Take nirmatrelvir 150 mg two tablets twice daily for 5 days and ritonavir 100 mg one tablet twice daily for 5 days) Patient GFR is normal   No facility-administered encounter medications on file as of 11/05/2023.      Medical History: Past Medical History:  Diagnosis Date   Atrial flutter (HCC)    Bell's palsy    Coronary artery disease, non-occlusive    Diabetes mellitus without complication (HCC)  Hyperlipidemia    Hypertension    Syncope      Vital Signs: BP 120/80   Pulse (!) 104   Temp (!) 100.4 F (38 C)   Ht 6\' 2"  (1.88 m)   SpO2 98%   BMI 34.15 kg/m   ROS: As per HPI.  All other pertinent ROS negative.     Review of Systems  Constitutional:  Positive for chills, diaphoresis, fatigue and fever.  HENT:  Positive for congestion. Negative for postnasal drip, sore throat and trouble swallowing.   Respiratory:  Positive for cough and shortness of breath.   Gastrointestinal:  Negative for diarrhea and nausea.  Musculoskeletal:  Positive for myalgias.   Neurological:  Negative for dizziness and headaches.    Physical Exam Constitutional:      Appearance: He is ill-appearing.  HENT:     Right Ear: A middle ear effusion is present.     Left Ear: A middle ear effusion is present.     Nose: Mucosal edema, congestion and rhinorrhea present.     Right Turbinates: Swollen.     Left Turbinates: Swollen.     Right Sinus: No maxillary sinus tenderness or frontal sinus tenderness.     Left Sinus: No maxillary sinus tenderness or frontal sinus tenderness.     Mouth/Throat:     Mouth: Mucous membranes are moist.     Pharynx: Posterior oropharyngeal erythema and postnasal drip present.     Tonsils: No tonsillar exudate.  Pulmonary:     Effort: Pulmonary effort is normal. Prolonged expiration present.     Breath sounds: Normal breath sounds.  Lymphadenopathy:     Head:     Right side of head: Submandibular and tonsillar adenopathy present.     Left side of head: Submandibular and tonsillar adenopathy present.     Cervical: Cervical adenopathy present.  Neurological:     Mental Status: He is alert.     No results found for this or any previous visit (from the past 24 hours).  Assessment/Plan: 1. Acute cough (Primary) COVID-positive.  We discussed given recent infection with the flu trying an antiviral to see if we can reduce severity and timeframe of his symptoms.  Spoke to his cardiologist Levy Sjogren, NP regarding either the use of Paxlovid versus molnupiravir.  Given he is on Eliquis, we decided molnupiravir 4 times daily would be the better option.  Patient understands how and when to take medication.  Drink plenty of water.  We also discussed previous syncopal episode likely secondary to dehydration.  He is tolerating liquids and foods and blood pressure while in clinic is WNL.  If he has concerns about his blood pressure, recommend coming back in to have this rechecked.  Otherwise, continue metoprolol per cardiology.  Discussed  albuterol inhaler for coughing spells.  Discussed how and when to take.  - POC SOFIA 2 FLU + SARS ANTIGEN FIA - albuterol (VENTOLIN HFA) 108 (90 Base) MCG/ACT inhaler; Inhale 2 puffs into the lungs every 6 (six) hours as needed for wheezing or shortness of breath.  Dispense: 8 g; Refill: 1 - molnupiravir EUA (LAGEVRIO) 200 mg CAPS capsule; Take 4 capsules (800 mg total) by mouth 2 (two) times daily for 5 days.  Dispense: 40 capsule; Refill: 0  2. Sore throat COVID-positive.  Discussed rotating Tylenol and ibuprofen for sore throat and bodyaches.  Continue OTC DayQuil/NyQuil or cold and flu medication while symptoms are present.  Recommend staying out of work for the next 2  days and wearing a mask to prevent the spread.  Please let me know if you develop any shortness of breath or concerning symptoms.  - POC SOFIA 2 FLU + SARS ANTIGEN FIA   General Counseling: Atha verbalizes understanding of the findings of todays visit and agrees with plan of treatment. I have discussed any further diagnostic evaluation that may be needed or ordered today. We also reviewed his medications today. he has been encouraged to call the office with any questions or concerns that should arise related to todays visit.   Orders Placed This Encounter  Procedures   POC SOFIA 2 FLU + SARS ANTIGEN FIA    Meds ordered this encounter  Medications   albuterol (VENTOLIN HFA) 108 (90 Base) MCG/ACT inhaler    Sig: Inhale 2 puffs into the lungs every 6 (six) hours as needed for wheezing or shortness of breath.    Dispense:  8 g    Refill:  1   DISCONTD: nirmatrelvir/ritonavir (PAXLOVID) 20 x 150 MG & 10 x 100MG  TABS    Sig: Take 3 tablets by mouth 2 (two) times daily for 5 days. (Take nirmatrelvir 150 mg two tablets twice daily for 5 days and ritonavir 100 mg one tablet twice daily for 5 days) Patient GFR is normal    Dispense:  30 tablet    Refill:  0   molnupiravir EUA (LAGEVRIO) 200 mg CAPS capsule    Sig: Take 4  capsules (800 mg total) by mouth 2 (two) times daily for 5 days.    Dispense:  40 capsule    Refill:  0    I spent 25 minutes dedicated to the care of this patient (face-to-face and non-face-to-face) on the date of the encounter to include what is described in the assessment and plan.   Durenda Hurt, NP 11/06/2023 8:58 AM

## 2023-11-05 NOTE — Telephone Encounter (Signed)
 Prescription refill request for Eliquis received. Indication:aflutter Last office visit:2/25 Scr:0.84  2/25 Age: 63 Weight:120.7  kg  Prescription refilled

## 2023-11-06 ENCOUNTER — Encounter: Payer: Self-pay | Admitting: Oncology

## 2023-11-19 ENCOUNTER — Ambulatory Visit: Payer: BC Managed Care – PPO

## 2023-11-19 DIAGNOSIS — I4892 Unspecified atrial flutter: Secondary | ICD-10-CM | POA: Diagnosis not present

## 2023-11-20 LAB — CUP PACEART REMOTE DEVICE CHECK
Date Time Interrogation Session: 20250310083900
Implantable Pulse Generator Implant Date: 20241206
Pulse Gen Serial Number: 131450

## 2023-11-22 ENCOUNTER — Encounter: Payer: Self-pay | Admitting: Cardiology

## 2023-11-22 NOTE — Progress Notes (Signed)
 Carelink Summary Report / Loop Recorder

## 2023-11-22 NOTE — Addendum Note (Signed)
 Addended by: Elease Etienne A on: 11/22/2023 02:29 PM   Modules accepted: Orders

## 2023-11-27 ENCOUNTER — Other Ambulatory Visit: Payer: Self-pay | Admitting: Family

## 2023-11-27 DIAGNOSIS — E119 Type 2 diabetes mellitus without complications: Secondary | ICD-10-CM

## 2023-12-10 ENCOUNTER — Ambulatory Visit: Payer: BC Managed Care – PPO | Attending: Cardiology | Admitting: Cardiology

## 2023-12-10 VITALS — BP 133/82 | HR 88 | Ht 74.0 in | Wt 261.6 lb

## 2023-12-10 DIAGNOSIS — I4892 Unspecified atrial flutter: Secondary | ICD-10-CM | POA: Diagnosis not present

## 2023-12-10 NOTE — Progress Notes (Signed)
 Electrophysiology Clinic Note    Date:  12/10/2023  Patient ID:  Dustin Fletcher, Dustin Fletcher [birth year], MRN [id number] PCP:  Allegra Grana, [name]  Cardiologist:  Lorine Bears, MD Electrophysiologist: Lanier Prude, MD   Discussed the use of AI scribe software for clinical note transcription with the patient, who gave verbal consent to proceed.   Patient Profile    Chief Complaint: AFib, Aflu, syncope follow-up  History of Present Illness: Dustin Fletcher is a 63 y.o. male with PMH notable for aflutter, non-obs CAD, T2DM, HTN; seen today for Lanier Prude, MD for routine electrophysiology followup.  He is s/p aflutter ablation w CTI on 08/17/2023. He had an ILR implanted at the same time for arrhythmia monitoring.  I saw him 1 month post-ablation where ILR showed 1% AFib burden, episodes were brief in duration.  He was diagnosed with flu 2/6, and had episode of syncope x 2 with a 6 second pause detected on ILR. EP evaluated while in hospital and thought both episodes vagally mediated.   I saw him in follow-up 2/19 where he was contuing to heal from influenza, no further syncopal episodes. He was then diagnosed with Covid-19.  On follow-up today, he is doing well from a cardiac perspective. His main complaint today is ongoing back pain. He has been taking extra stength tylenol without improvement.  He is asymptomatic of his atrial arrhythmia. He wears an apple watch that intermittently alerts him to HR in the 120s, but he can not recall if he is alerted during activity or at rest.   He denies chest pain, chest pressure, palpitations.  He continues to take eliquis BID, no bleeding concerns.    Device Information: Bos Sci ILR, imp 08/2023; dx afib/flut mgmt  AAD History: None     ROS:  Please see the history of present illness. All other systems are reviewed and otherwise negative.    Physical Exam    VS:  BP 133/82 (BP Location: Left Arm, Patient Position:  Sitting, Cuff Size: Large)   Pulse 88   Ht 6\' 2"  (1.88 m)   Wt 261 lb 9.6 oz (118.7 kg)   SpO2 96%   BMI 33.59 kg/m  BMI: Body mass index is 33.59 kg/m.  Wt Readings from Last 3 Encounters:  12/10/23 261 lb 9.6 oz (118.7 kg)  10/31/23 266 lb (120.7 kg)  10/22/23 259 lb 0.7 oz (117.5 kg)    GEN- The patient is well appearing, alert and oriented x 3 today.   Lungs- Clear to ausculation bilaterally, normal work of breathing.  Heart- Regular rate and rhythm, no murmurs, rubs or gallops Extremities- No peripheral edema, warm, dry Skin-  ILR site well-healed    3/31 ILR remote device interrogation reviewed by myself:  Battery good No AFib episodes since 2/14    Studies Reviewed   Previous EP, cardiology notes.    EKG is ordered. Personal review of EKG from today shows:    EKG Interpretation Date/Time:  Monday December 10 2023 14:01:18 EDT Ventricular Rate:  88 PR Interval:  192 QRS Duration:  114 QT Interval:  380 QTC Calculation: 459 R Axis:   0  Text Interpretation: Normal sinus rhythm Normal ECG Confirmed by Sherie Don [phone]) on 12/10/2023 2:06:51 PM    Coronary CTA, 03/26/2023 1. Coronary calcium score of 410. This was 87th percentile for age and sex matched control.   2. Normal coronary origin with right dominance.  3. Mild proximal RCA stenosis (  25-49%).  4. Minimal proximal LAD stenosis (<25%).  5. CAD-RADS 2. Mild non-obstructive CAD (25-49%). Consider non-atherosclerotic causes of chest pain. Consider preventive therapy and risk factor modification.  Long term monitor, 02/22/2023 Patient had a min HR of 31 bpm, max HR of 214 bpm, and avg HR of 86 bpm. Predominant underlying rhythm was Sinus Rhythm. First Degree AV Block was present. 2 Ventricular Tachycardia runs occurred, the run with the fastest interval lasting 10 beats with a  max rate of 214 bpm (avg 179 bpm); the run with the fastest interval was also the longest. 82 Supraventricular Tachycardia runs  occurred, the run with the fastest interval lasting 6 beats with a max rate of 200 bpm, the longest lasting 23 mins 34 secs with an avg rate of 114 bpm. Atrial Flutter occurred (<1% burden), ranging from 86-168 bpm (avg of 147 bpm), the longest lasting 48 mins 3 secs with an avg rate of 147 bpm. Second Degree AV Block-Mobitz I (Wenckebach) was present. Supraventricular Tachycardia and Atrial Flutter were detected within +/- 45 seconds of symptomatic patient event(s).  Rare PACs and rare PVCs.    Assessment and Plan     #) Aflutter #) AFib #) ILR in situ S/p AFl ablation 08/17/23 Significant increase in AFib around his influenza diagnosis AFib burden has reduced significantly since that time. Continue 25mg  lopressor BID Continue to monitor AFib/flutter via monthly ILR  #) Hypercoag d/t aflutter  CHA2DS2-VASc Score = at least 3 [CHF History: 0, HTN History: 1, Diabetes History: 1, Stroke History: 0, Vascular Disease History: 1, Age Score: 0, Gender Score: 0].  Therefore, the patient's annual risk of stroke is 3.2 %.    Stroke ppx - 5mg  eliquis, appropriately dosed No bleeding concerns   #) syncope No further syncopal episodes No further pauses on ILR monitor   Current medicines are reviewed at length with the patient today.   The patient does not have concerns regarding his medicines.  The following changes were made today:   none  Labs/ tests ordered today include:  Orders Placed This Encounter  Procedures   EKG 12-Lead     Disposition: Follow up with Dr. Lalla Brothers or EP APP in 6 months   Signed, Sherie Don, NP  12/10/23  2:28 PM  Electrophysiology [facility]

## 2023-12-20 ENCOUNTER — Ambulatory Visit (INDEPENDENT_AMBULATORY_CARE_PROVIDER_SITE_OTHER): Payer: BC Managed Care – PPO

## 2023-12-20 DIAGNOSIS — I4892 Unspecified atrial flutter: Secondary | ICD-10-CM | POA: Diagnosis not present

## 2023-12-23 LAB — CUP PACEART REMOTE DEVICE CHECK
Date Time Interrogation Session: 20250411174100
Implantable Pulse Generator Implant Date: 20241206
Pulse Gen Serial Number: 131450

## 2023-12-25 ENCOUNTER — Encounter: Payer: Self-pay | Admitting: Cardiology

## 2023-12-31 ENCOUNTER — Other Ambulatory Visit: Payer: BC Managed Care – PPO

## 2023-12-31 DIAGNOSIS — E118 Type 2 diabetes mellitus with unspecified complications: Secondary | ICD-10-CM

## 2024-01-01 LAB — MICROALBUMIN / CREATININE URINE RATIO
Creatinine, Urine: 51 mg/dL
Microalb/Creat Ratio: 7 mg/g{creat} (ref 0–29)
Microalbumin, Urine: 3.8 ug/mL

## 2024-01-01 LAB — BASIC METABOLIC PANEL WITH GFR
BUN/Creatinine Ratio: 15 (ref 10–24)
BUN: 13 mg/dL (ref 8–27)
CO2: 23 mmol/L (ref 20–29)
Calcium: 9.5 mg/dL (ref 8.6–10.2)
Chloride: 106 mmol/L (ref 96–106)
Creatinine, Ser: 0.89 mg/dL (ref 0.76–1.27)
Glucose: 173 mg/dL — ABNORMAL HIGH (ref 70–99)
Potassium: 4.2 mmol/L (ref 3.5–5.2)
Sodium: 142 mmol/L (ref 134–144)
eGFR: 97 mL/min/{1.73_m2} (ref >59)

## 2024-01-01 LAB — HEMOGLOBIN A1C
Est. average glucose Bld gHb Est-mCnc: 154 mg/dL
Hgb A1c MFr Bld: 7 % — ABNORMAL HIGH (ref 4.8–5.6)

## 2024-01-02 ENCOUNTER — Encounter: Payer: Self-pay | Admitting: Family

## 2024-01-08 NOTE — Addendum Note (Signed)
 Addended by: Edra Govern D on: 01/08/2024 12:39 PM   Modules accepted: Orders

## 2024-01-08 NOTE — Progress Notes (Signed)
 Bsx Loop recorder

## 2024-01-10 ENCOUNTER — Ambulatory Visit: Payer: BC Managed Care – PPO | Admitting: Family

## 2024-01-10 ENCOUNTER — Encounter: Payer: Self-pay | Admitting: Family

## 2024-01-10 VITALS — BP 130/64 | HR 89 | Temp 98.2°F | Ht 74.0 in | Wt 261.2 lb

## 2024-01-10 DIAGNOSIS — E118 Type 2 diabetes mellitus with unspecified complications: Secondary | ICD-10-CM

## 2024-01-10 DIAGNOSIS — Z7984 Long term (current) use of oral hypoglycemic drugs: Secondary | ICD-10-CM | POA: Diagnosis not present

## 2024-01-10 DIAGNOSIS — E785 Hyperlipidemia, unspecified: Secondary | ICD-10-CM

## 2024-01-10 NOTE — Patient Instructions (Addendum)
 Schedule labs; these are nonfasting Schedule 04/04/24 ( after 03/31/24)  Nice to see you

## 2024-01-10 NOTE — Assessment & Plan Note (Signed)
Chronic, stable. Continue crestor 10mg 

## 2024-01-10 NOTE — Progress Notes (Signed)
 Assessment & Plan:  DM type 2, controlled, with complication (HCC) Assessment & Plan: A1c improved.  Continue dapagliflozin  - metformin  5-1000mg  two tablets QD , rybelsus  14 mg qd .   Orders: -     Hemoglobin A1c -     Comprehensive metabolic panel with GFR  Hyperlipidemia, unspecified hyperlipidemia type Assessment & Plan: Chronic, stable. Continue crestor  10mg       Return precautions given.   Risks, benefits, and alternatives of the medications and treatment plan prescribed today were discussed, and patient expressed understanding.   Education regarding symptom management and diagnosis given to patient on AVS either electronically or printed.  Return in about 3 months (around 04/11/2024).  Bascom Bossier, [name]  Subjective:    Patient ID: Dustin Fletcher, male    DOB: Jul 16, 1961, 63 y.o.   MRN: [id number]  CC: Dustin Fletcher is a 63 y.o. male who presents today for follow up.   HPI: Feels well today.  No new complaints   Colonoscopy is due. He declines at this time.   Follow-up cardiology 12/10/2023 for afib, s/p ILR 08/17/23  He is compliant with lopressor  25mg  BID. Compliant with eliquis .     Allergies: Patient has no known allergies. Current Outpatient Medications on File Prior to Visit  Medication Sig Dispense Refill   blood glucose meter kit and supplies KIT Dispense based on patient and insurance preference. Use up to four times daily as directed. (FOR ICD-9 250.00, 250.01). 1 each 0   cholecalciferol  (VITAMIN D3) 25 MCG (1000 UNIT) tablet Take 1,000 Units by mouth 2 (two) times daily.     ELDERBERRY PO Take 1 tablet by mouth 2 (two) times daily.     ELIQUIS  5 MG TABS tablet TAKE 1 TABLET BY MOUTH 2 TIMES A DAY 60 tablet 5   metoprolol  tartrate (LOPRESSOR ) 25 MG tablet Take 1 tablet (25 mg total) by mouth 2 (two) times daily. 180 tablet 3   Omega-3 Fatty Acids (FISH OIL) 1000 MG CAPS Take 1,000-2,000 mg by mouth See admin instructions. Take 2000 mg in  morning and 1000 mg at bedtime     rosuvastatin  (CRESTOR ) 10 MG tablet Take 1 tablet (10 mg total) by mouth daily. 90 tablet 3   [name]  14 MG TABS TAKE 1 TABLET BY MOUTH DAILY 90 tablet 1   vitamin B-12 (CYANOCOBALAMIN ) 1000 MCG tablet Take 1,000 mcg by mouth daily.     XIGDUO  XR 01-999 MG TB24 TAKE 2 TABLETS BY MOUTH EVERY MORNING 180 tablet 3   No current facility-administered medications on file prior to visit.    Review of Systems  Constitutional:  Negative for chills and fever.  Respiratory:  Negative for cough.   Cardiovascular:  Negative for chest pain and palpitations.  Gastrointestinal:  Negative for nausea and vomiting.      Objective:    BP 130/64   Pulse 89   Temp 98.2 F (36.8 C) (Oral)   Ht 6\' 2"  (1.88 m)   Wt 261 lb 3.2 oz (118.5 kg)   SpO2 97%   BMI 33.54 kg/m  BP Readings from Last 3 Encounters:  01/10/24 130/64  12/10/23 133/82  11/05/23 120/80   Wt Readings from Last 3 Encounters:  01/10/24 261 lb 3.2 oz (118.5 kg)  12/10/23 261 lb 9.6 oz (118.7 kg)  10/31/23 266 lb (120.7 kg)    Physical Exam Vitals reviewed.  Constitutional:      Appearance: He is well-developed.  Cardiovascular:     Rate and  Rhythm: Regular rhythm.     Heart sounds: Normal heart sounds.  Pulmonary:     Effort: Pulmonary effort is normal. No respiratory distress.     Breath sounds: Normal breath sounds. No wheezing, rhonchi or rales.  Skin:    General: Skin is warm and dry.  Neurological:     Mental Status: He is alert.  Psychiatric:        Speech: Speech normal.        Behavior: Behavior normal.

## 2024-01-10 NOTE — Assessment & Plan Note (Signed)
 A1c improved.  Continue dapagliflozin  - metformin  5-1000mg  two tablets QD , rybelsus  14 mg qd .

## 2024-01-17 ENCOUNTER — Ambulatory Visit: Attending: Cardiology | Admitting: Cardiology

## 2024-01-17 VITALS — BP 120/62 | HR 68 | Resp 16 | Ht 74.0 in | Wt 261.2 lb

## 2024-01-17 DIAGNOSIS — Z95818 Presence of other cardiac implants and grafts: Secondary | ICD-10-CM | POA: Diagnosis not present

## 2024-01-17 DIAGNOSIS — I4892 Unspecified atrial flutter: Secondary | ICD-10-CM

## 2024-01-17 DIAGNOSIS — I480 Paroxysmal atrial fibrillation: Secondary | ICD-10-CM

## 2024-01-17 DIAGNOSIS — D6869 Other thrombophilia: Secondary | ICD-10-CM

## 2024-01-17 NOTE — Patient Instructions (Signed)
 Medication Instructions:  Your Physician recommend you continue on your current medication as directed.    *If you need a refill on your cardiac medications before your next appointment, please call your pharmacy*  Lab Work: No labs ordered today  If you have labs (blood work) drawn today and your tests are completely normal, you will receive your results only by: MyChart Message (if you have MyChart) OR A paper copy in the mail If you have any lab test that is abnormal or we need to change your treatment, we will call you to review the results.  Testing/Procedures: No test ordered today   Follow-Up: At [facility], you and your health needs are our priority.  As part of our continuing mission to provide you with exceptional heart care, our providers are all part of one team.  This team includes your primary Cardiologist (physician) and Advanced Practice Providers or APPs (Physician Assistants and Nurse Practitioners) who all work together to provide you with the care you need, when you need it.  Your next appointment:   6 month(s)  Provider:   Harvie Liner, MD or Suzann Riddle, NP

## 2024-01-17 NOTE — Progress Notes (Signed)
 Electrophysiology Clinic Note    Date:  01/17/2024  Patient ID:  Dustin Fletcher, Dustin Fletcher 10/26/1960, MRN [id number] PCP:  Calista Catching, [name]  Cardiologist:  Antionette Kirks, MD Electrophysiologist: Boyce Byes, MD   Discussed the use of AI scribe software for clinical note transcription with the patient, who gave verbal consent to proceed.   Patient Profile    Chief Complaint: AFib, Aflu follow-up  History of Present Illness: Dustin Fletcher is a 63 y.o. male with PMH notable for aflutter, non-obs CAD, T2DM, HTN; seen today for Boyce Byes, MD for routine electrophysiology followup.  He is s/p aflutter ablation w CTI on 08/17/2023. He had an ILR implanted at the same time for arrhythmia monitoring.  I saw him 1 month post-ablation where ILR showed 1% AFib burden, episodes were brief in duration.  He was diagnosed with influenza 2/6, and had episode of syncope x 2 with a 6 second pause detected on ILR. EP evaluated while in hospital and thought both episodes vagally mediated. He was then subsequently diagnosed with covid-19.  I last saw him 3/13 where he was doing well from cardiac standpoint without recent AFib episodes.   On follow-up today, he continues to feel well from a cardiac standpoint. He has not had any AF episodes that he is aware of. He has completely recovered from his flu and covid diagnosis, back to work full time with [employer].  He continues to take eliquis  BID, no bleeding issues. He would like to stop eliquis  when recommended.     Device Information: Bos Sci ILR, imp 08/2023; dx afib/flut mgmt  AAD History: None     ROS:  Please see the history of present illness. All other systems are reviewed and otherwise negative.    Physical Exam    VS:  BP 120/62 (BP Location: Left Arm, Patient Position: Sitting, Cuff Size: Large)   Pulse 68   Resp 16   Ht 6\' 2"  (1.88 m)   Wt 261 lb 3.2 oz (118.5 kg)   SpO2 96%   BMI 33.54 kg/m  BMI: Body mass  index is 33.54 kg/m.  Wt Readings from Last 3 Encounters:  01/17/24 261 lb 3.2 oz (118.5 kg)  01/10/24 261 lb 3.2 oz (118.5 kg)  12/10/23 261 lb 9.6 oz (118.7 kg)    GEN- The patient is well appearing, alert and oriented x 3 today.   Lungs- Clear to ausculation bilaterally, normal work of breathing.  Heart- Regular rate and rhythm, no murmurs, rubs or gallops Extremities- No peripheral edema, warm, dry Skin-  ILR site well-healed     ILR remote device interrogation done and reviewed by myself:  Battery good No AFib episodes since 2/16    Studies Reviewed   Previous EP, cardiology notes.    EKG is not ordered. Personal review of EKG from 12/10/2023 shows:  SR, rate 88       TTE, 10/20/2023  1. Left ventricular ejection fraction, by estimation, is 60 to 65%. The left ventricle has normal function. The left ventricle has no regional wall motion abnormalities. There is moderate left ventricular hypertrophy of the basal-septal segment. Left ventricular diastolic parameters were normal.   2. Right ventricular systolic function is normal. The right ventricular size is normal.   3. The mitral valve is normal in structure. No evidence of mitral valve regurgitation. No evidence of mitral stenosis.   4. The aortic valve is tricuspid. Aortic valve regurgitation is not visualized. Aortic  valve sclerosis/calcification is present, without any evidence of aortic stenosis. Aortic valve area, by VTI measures 3.44 cm. Aortic valve mean gradient measures 2.0 mmHg. Aortic valve Vmax measures 0.97 m/s.   Coronary CTA, 03/26/2023 1. Coronary calcium  score of 410. This was 87th percentile for age and sex matched control.   2. Normal coronary origin with right dominance.  3. Mild proximal RCA stenosis (25-49%).  4. Minimal proximal LAD stenosis (<25%).  5. CAD-RADS 2. Mild non-obstructive CAD (25-49%). Consider non-atherosclerotic causes of chest pain. Consider preventive therapy and risk factor  modification.  Long term monitor, 02/22/2023 Patient had a min HR of 31 bpm, max HR of 214 bpm, and avg HR of 86 bpm. Predominant underlying rhythm was Sinus Rhythm. First Degree AV Block was present. 2 Ventricular Tachycardia runs occurred, the run with the fastest interval lasting 10 beats with a  max rate of 214 bpm (avg 179 bpm); the run with the fastest interval was also the longest. 82 Supraventricular Tachycardia runs occurred, the run with the fastest interval lasting 6 beats with a max rate of 200 bpm, the longest lasting 23 mins 34 secs with an avg rate of 114 bpm. Atrial Flutter occurred (<1% burden), ranging from 86-168 bpm (avg of 147 bpm), the longest lasting 48 mins 3 secs with an avg rate of 147 bpm. Second Degree AV Block-Mobitz I (Wenckebach) was present. Supraventricular Tachycardia and Atrial Flutter were detected within +/- 45 seconds of symptomatic patient event(s).  Rare PACs and rare PVCs.    Assessment and Plan     #) Aflutter #) AFib #) ILR in situ S/p AFl ablation 08/17/23 Significant increase in AFib around his influenza diagnosis AFib burden has reduced significantly since that time. Continue 25mg  lopressor  BID Continue to monitor AFib/flutter via monthly ILR  #) Hypercoag d/t aflutter  CHA2DS2-VASc Score = at least 3 [CHF History: 0, HTN History: 1, Diabetes History: 1, Stroke History: 0, Vascular Disease History: 1, Age Score: 0, Gender Score: 0].  Therefore, the patient's annual risk of stroke is 3.2 %.    Stroke ppx - 5mg  eliquis , appropriately dosed No bleeding concerns Will discuss appropriateness of stopping eliquis  when he has been maintaining sinus rhythm for 6 consecutive months with elevated Chadsvasc score    Current medicines are reviewed at length with the patient today.   The patient does not have concerns regarding his medicines.  The following changes were made today:   none  Labs/ tests ordered today include:  No orders of the defined  types were placed in this encounter.    Disposition: Follow up with Dr. Marven Slimmer or EP APP in 6 months   Signed, Antwone Capozzoli, NP  01/17/24  1:46 PM  Electrophysiology [facility]

## 2024-01-21 ENCOUNTER — Ambulatory Visit: Payer: BC Managed Care – PPO

## 2024-01-21 DIAGNOSIS — I4892 Unspecified atrial flutter: Secondary | ICD-10-CM

## 2024-01-22 LAB — CUP PACEART REMOTE DEVICE CHECK
Date Time Interrogation Session: 20250512053200
Implantable Pulse Generator Implant Date: 20241206
Pulse Gen Serial Number: 131450

## 2024-01-23 ENCOUNTER — Other Ambulatory Visit: Payer: Self-pay

## 2024-01-23 DIAGNOSIS — E118 Type 2 diabetes mellitus with unspecified complications: Secondary | ICD-10-CM

## 2024-01-27 ENCOUNTER — Ambulatory Visit: Payer: Self-pay | Admitting: Cardiology

## 2024-02-01 NOTE — Addendum Note (Signed)
 Addended by: Lott Rouleau A on: 02/01/2024 09:56 AM   Modules accepted: Orders

## 2024-02-01 NOTE — Progress Notes (Signed)
 Carelink Summary Report / Loop Recorder

## 2024-02-10 ENCOUNTER — Other Ambulatory Visit: Payer: Self-pay | Admitting: Family

## 2024-02-10 DIAGNOSIS — E118 Type 2 diabetes mellitus with unspecified complications: Secondary | ICD-10-CM

## 2024-02-21 ENCOUNTER — Ambulatory Visit (INDEPENDENT_AMBULATORY_CARE_PROVIDER_SITE_OTHER): Payer: BC Managed Care – PPO

## 2024-02-21 DIAGNOSIS — R55 Syncope and collapse: Secondary | ICD-10-CM | POA: Diagnosis not present

## 2024-02-25 LAB — CUP PACEART REMOTE DEVICE CHECK
Date Time Interrogation Session: 20250615065200
Implantable Pulse Generator Implant Date: 20241206
Pulse Gen Serial Number: 131450

## 2024-02-26 ENCOUNTER — Ambulatory Visit: Payer: Self-pay | Admitting: Cardiology

## 2024-02-28 ENCOUNTER — Encounter: Payer: Self-pay | Admitting: Nurse Practitioner

## 2024-02-28 ENCOUNTER — Ambulatory Visit: Attending: Nurse Practitioner | Admitting: Physician Assistant

## 2024-02-28 VITALS — BP 108/72 | HR 89 | Ht 74.0 in | Wt 256.8 lb

## 2024-02-28 DIAGNOSIS — Z79899 Other long term (current) drug therapy: Secondary | ICD-10-CM

## 2024-02-28 DIAGNOSIS — I480 Paroxysmal atrial fibrillation: Secondary | ICD-10-CM | POA: Diagnosis not present

## 2024-02-28 DIAGNOSIS — I2510 Atherosclerotic heart disease of native coronary artery without angina pectoris: Secondary | ICD-10-CM | POA: Diagnosis not present

## 2024-02-28 DIAGNOSIS — I4892 Unspecified atrial flutter: Secondary | ICD-10-CM

## 2024-02-28 DIAGNOSIS — E785 Hyperlipidemia, unspecified: Secondary | ICD-10-CM

## 2024-02-28 DIAGNOSIS — I10 Essential (primary) hypertension: Secondary | ICD-10-CM

## 2024-02-28 NOTE — Progress Notes (Signed)
 Cardiology Office Note    Date:  02/28/2024   ID:  Dustin Fletcher, DOB 1961-02-28, MRN [id number]  PCP:  Calista Catching, [name]  Cardiologist:  Antionette Kirks, MD  Electrophysiologist:  Boyce Byes, MD   Chief Complaint: Follow-up  History of Present Illness:   Dustin Fletcher is a 63 y.o. male with history of nonobstructive CAD noted on coronary CTA, paroxysmal atrial flutter s/p ablation 08/2023 on Lopressor  and apixaban , type 2 diabetes, hypertension, and hyperlipidemia who presents for follow-up on CAD and paroxysmal atrial fibrillation/flutter.    Patient was seen 03/2022 and noted to be in atrial flutter with RVR.  He was transitioned from lisinopril  to metoprolol  and subsequently noted to be in sinus rhythm.  He was started on anticoagulation.  Echo showed normal LV systolic function with no significant valvular abnormalities.  He was seen 02/2023 with worsening palpitations and chest pain.  Coronary CTA showed calcium  score 410 with mild nonobstructive CAD.  Outpatient cardiac monitor showed intermittent atrial flutter.  He underwent atrial flutter ablation by Dr. Marven Slimmer on 08/2023.  In addition, he had a loop recorder placed to assess for atrial arrhythmia.  He was seen by EP 09/2023 noting episodes of tachycardic rates and feeling of hot during these episodes.  He continued to note brief episodes of palpitations.  He was interested in stopping metoprolol  and apixaban .  There was also concern for sleep disordered breathing, snoring, and daytime somnolence.  Interrogation of loop recorder at that time showed frequent, brief episodes of atrial flutter with an overall burden of 1%, lasting up to 1 hour 22 minutes.  It was recommended he continue metoprolol  and apixaban .  He was not interested in pursuing sleep study.  Patient presented to [facility] ED 10/19/2023 after 2 syncopal episodes.  Loop recorder showed a 6.1-second pause at 530 4 in the morning with escape beat and sinus  bradycardia in the 20s bpm.  Overall atrial fibrillation burden of 3%.  Metoprolol  was held with with the last dose being 2/7 AM.  Echo showed EF 60 to 65% with no RWMA.  No arrhythmia, pauses, or high-grade heart block noted on telemetry.  He was evaluated by EP on 2/10 who suspected that syncopal episode was secondary to dehydration, influenza infection, and vagal effect.  It was recommended that he continue to hold metoprolol  until completely recovered from acute illness.  Patient felt back to baseline and was discharged in stable condition. Patient was seen in follow-up with PCP on 10/31/2023 and restarted on metoprolol .  He was feeling fully recovered from his flu with atrial fibrillation burden improved.   Patient was most recently seen by EP 01/17/2024 and feeling well from a cardiac perspective without symptoms concerning for recurrent A-fib.  Patient presents to clinic today doing overall well from a cardiac perspective.  No symptoms of palpitations, lightheadedness, or dizziness concerning for atrial fibrillation recurrence.  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  He is without symptoms of angina or cardiac decompensation.  He denies lower extremity swelling and orthopnea.  He denies bleeding and hematochezia.  Labs independently reviewed: 12/31/2023-BUN 13, creatinine 0.89, NA 142, K 4.2, A1c 7.0 10/21/2023-Hgb 15.1, HCT 45.4, platelets 137 03/31/2023-TC 121, TG 163, HDL 37, LDL 56  Objective   Past Medical History:  Diagnosis Date   Aortic valve sclerosis    a. 10/2023 Echo: EF 60-65%, no rwma, mod LVH, nl RV fxn,  Ao sclerosis w/o stenosis.   Atrial flutter (HCC)    a. 03/2023 Zio: Predominantly RSR, avg 86 (31-214).  1st deg AVB and Mobitz 1 present.  Less than 1% atrial flutter burden (average 147) with longest atrial flutter episode 30m and 3s.  SVT and atrial flutter associate with triggered events; b. 08/2023 s/p Aflutter  ablation and ILR; c. CHA2DS2VASc = 3-->eliquis .   Bell's palsy    Coronary artery disease, non-occlusive    a. 03/2023 Cor CTA: Ca2+ = 410 (87th%'ile). LAD <25, RCA 25-49p.   Diabetes mellitus without complication (HCC)    Hyperlipidemia    Hypertension    PAF (paroxysmal atrial fibrillation) (HCC)    PSVT (paroxysmal supraventricular tachycardia) (HCC)    a. 03/2023 Zio: 82 SVT runs, fastest 6 beats at 200, longest 3m and 34s at 114.   Syncope    a. in setting of flu and 6 sec pause on ILR - felt to be vagally mediated.    Current Medications: Current Meds  Medication Sig   blood glucose meter kit and supplies KIT Dispense based on patient and insurance preference. Use up to four times daily as directed. (FOR ICD-9 250.00, 250.01).   cholecalciferol  (VITAMIN D3) 25 MCG (1000 UNIT) tablet Take 1,000 Units by mouth 2 (two) times daily.   ELDERBERRY PO Take 1 tablet by mouth 2 (two) times daily.   ELIQUIS  5 MG TABS tablet TAKE 1 TABLET BY MOUTH 2 TIMES A DAY   metoprolol  tartrate (LOPRESSOR ) 25 MG tablet Take 1 tablet (25 mg total) by mouth 2 (two) times daily.   Omega-3 Fatty Acids (FISH OIL) 1000 MG CAPS Take 1,000-2,000 mg by mouth See admin instructions. Take 2000 mg in morning and 1000 mg at bedtime   rosuvastatin  (CRESTOR ) 10 MG tablet Take 1 tablet (10 mg total) by mouth daily.   [name]  14 MG TABS TAKE 1 TABLET BY MOUTH DAILY   vitamin B-12 (CYANOCOBALAMIN ) 1000 MCG tablet Take 1,000 mcg by mouth daily.   XIGDUO  XR 01-999 MG TB24 TAKE 2 TABLETS BY MOUTH EVERY MORNING    Allergies:   Patient has no known allergies.   Social History   Socioeconomic History   Marital status: Married    Spouse name: Not on file   Number of children: Not on file   Years of education: Not on file   Highest education level: Not on file  Occupational History   Not on file  Tobacco Use   Smoking status: Former    Current packs/day: 0.00    Types: Cigarettes    Quit date: 05/31/1981    Years  since quitting: 42.7   Smokeless tobacco: Never  Vaping Use   Vaping status: Never Used  Substance and Sexual Activity   Alcohol use: No    Alcohol/week: 0.0 standard drinks of alcohol   Drug use: No   Sexual activity: Not on file  Other Topics Concern   Not on file  Social History Narrative   Linden [employer]s - Adin Honour      Social Drivers of Health   Financial Resource Strain: Patient Declined (10/07/2023)   Overall Financial Resource Strain (CARDIA)    Difficulty of Paying Living Expenses: Patient declined  Food Insecurity: No Food Insecurity (10/24/2023)   Hunger Vital Sign    Worried About Running Out of Food in the Last Year: Never true    Ran Out of Food in the Last Year: Never true  Transportation Needs: No Transportation Needs (10/24/2023)   PRAPARE -  Administrator, Civil Service (Medical): No    Lack of Transportation (Non-Medical): No  Physical Activity: Sufficiently Active (10/07/2023)   Exercise Vital Sign    Days of Exercise per Week: 5 days    Minutes of Exercise per Session: 60 min  Stress: No Stress Concern Present (10/07/2023)   [employer] of Occupational Health - Occupational Stress Questionnaire    Feeling of Stress : Not at all  Social Connections: Unknown (10/07/2023)   Social Connection and Isolation Panel    Frequency of Communication with Friends and Family: Patient declined    Frequency of Social Gatherings with Friends and Family: Patient declined    Attends Religious Services: Patient declined    Database administrator or Organizations: Patient declined    Attends Engineer, structural: Not on file    Marital Status: Patient declined     Family History:  The patient's family history includes Cancer in his father and mother; Diabetes in his father; Heart disease in his brother; Hypertension in his brother and father.  ROS:   12-point review of systems is negative unless otherwise noted in the HPI.   EKGs/Other Studies  Reviewed:    Studies reviewed were summarized above. The additional studies were reviewed today: 10/20/2023 Echo complete 1. Left ventricular ejection fraction, by estimation, is 60 to 65%. The  left ventricle has normal function. The left ventricle has no regional  wall motion abnormalities. There is moderate left ventricular hypertrophy  of the basal-septal segment. Left  ventricular diastolic parameters were normal.   2. Right ventricular systolic function is normal. The right ventricular  size is normal.   3. The mitral valve is normal in structure. No evidence of mitral valve  regurgitation. No evidence of mitral stenosis.   4. The aortic valve is tricuspid. Aortic valve regurgitation is not  visualized. Aortic valve sclerosis/calcification is present, without any  evidence of aortic stenosis. Aortic valve area, by VTI measures 3.44 cm.  Aortic valve mean gradient measures  2.0 mmHg. Aortic valve Vmax measures 0.97 m/s.   03/26/2023 cCTA 1. Coronary calcium  score of 410. This was 87th percentile for age and sex matched control. 2. Normal coronary origin with right dominance. 3. Mild proximal RCA stenosis (25-49%). 4. Minimal proximal LAD stenosis (<25%). 5. CAD-RADS 2. Mild non-obstructive CAD (25-49%). Consider non-atherosclerotic causes of chest pain. Consider preventive therapy and risk factor modification.  EKG:  EKG personally reviewed by me today EKG Interpretation Date/Time:  Thursday February 28 2024 14:17:16 EDT Ventricular Rate:  89 PR Interval:  204 QRS Duration:  110 QT Interval:  380 QTC Calculation: 462 R Axis:   2  Text Interpretation: Normal sinus rhythm Normal ECG When compared with ECG of 10-Dec-2023 14:01, No significant change was found Confirmed by Gildardo Labrador ([id number]) on 02/28/2024 2:21:04 PM  PHYSICAL EXAM:    VS:  BP 108/72   Pulse 89   Ht 6' 2 (1.88 m)   Wt 256 lb 12.8 oz (116.5 kg)   SpO2 99%   BMI 32.97 kg/m   BMI: Body mass index is 32.97  kg/m.  Physical Exam Vitals and nursing note reviewed.  Constitutional:      Appearance: Normal appearance.   Cardiovascular:     Rate and Rhythm: Normal rate and regular rhythm.     Heart sounds: No murmur heard.    No gallop.  Pulmonary:     Effort: Pulmonary effort is normal.     Breath sounds:  Normal breath sounds. No wheezing or rales.   Musculoskeletal:     Right lower leg: No edema.     Left lower leg: No edema.   Skin:    General: Skin is warm and dry.   Neurological:     General: No focal deficit present.     Mental Status: He is alert and oriented to person, place, and time. Mental status is at baseline.   Psychiatric:        Mood and Affect: Mood normal.        Behavior: Behavior normal.    Wt Readings from Last 3 Encounters:  02/28/24 256 lb 12.8 oz (116.5 kg)  01/17/24 261 lb 3.2 oz (118.5 kg)  01/10/24 261 lb 3.2 oz (118.5 kg)       ASSESSMENT & PLAN:   Paroxysmal atrial fibrillation/flutter - Atrial fibrillation ablation 08/2023. No recent symptoms concerning for recurrence.  CHA2DS2-VASc at least 3 (HTN, T2DM, vascular disease).  He is continued on Eliquis  5 mg twice daily.  Denies bleeding and hematochezia.  He is continued on metoprolol  tartrate 25 mg twice daily.  Coronary artery disease  - Coronary CTA done 03/2023 with calcium  score 410.  He is without symptoms of angina and cardiac decompensation.  Continue medical therapy with rosuvastatin .  No aspirin in the setting of long-term DOAC.  Hyperlipidemia - Most recent lipid panel 08/2023 with LDL 56.  Rosuvastatin  increased at last visit due to elevated triglycerides 163.  Will recheck lipid panel.  Continue rosuvastatin  10 mg daily.  Essential hypertension - Blood pressure well-controlled.  Continue metoprolol  as above.   Disposition: F/u with Dr. Alvenia Aus or an APP in 6 months.   Medication Adjustments/Labs and Tests Ordered: Current medicines are reviewed at length with the patient today.   Concerns regarding medicines are outlined above. Medication changes, Labs and Tests ordered today are summarized above and listed in the Patient Instructions accessible in Encounters.   Beather Liming, PA-C 02/28/2024 3:20 PM     [facility] [place] [address] [place], [place] [address] [phone]

## 2024-02-28 NOTE — Patient Instructions (Addendum)
 Medication Instructions:   Your physician recommends that you continue on your current medications as directed. Please refer to the Current Medication list given to you today.   *If you need a refill on your cardiac medications before your next appointment, please call your pharmacy*  Lab Work:  Your provider would like for you within the next couple of weeks to have the following labs drawn: LIPID PANEL to the nearest [facility] of your choice.  You will need to be fasting. (Nothing to eat or drink after midnight the night before or the  morning of labs   Testing/Procedures:  No test ordered today   Follow-Up: At [facility], you and your health needs are our priority.  As part of our continuing mission to provide you with exceptional heart care, our providers are all part of one team.  This team includes your primary Cardiologist (physician) and Advanced Practice Providers or APPs (Physician Assistants and Nurse Practitioners) who all work together to provide you with the care you need, when you need it.  Your next appointment:   6 month(s)  Provider:   You may see Antionette Kirks, MD or one of the following Advanced Practice Providers on your designated Care Team:    Gildardo Labrador, PA-C

## 2024-03-11 NOTE — Progress Notes (Signed)
 Bsx Loop Recorder

## 2024-03-11 NOTE — Addendum Note (Signed)
 Addended by: TAWNI DRILLING D on: 03/11/2024 05:36 PM   Modules accepted: Orders

## 2024-03-17 ENCOUNTER — Other Ambulatory Visit: Payer: Self-pay

## 2024-03-17 DIAGNOSIS — Z79899 Other long term (current) drug therapy: Secondary | ICD-10-CM

## 2024-03-18 ENCOUNTER — Other Ambulatory Visit

## 2024-03-18 DIAGNOSIS — Z79899 Other long term (current) drug therapy: Secondary | ICD-10-CM

## 2024-03-19 ENCOUNTER — Ambulatory Visit: Payer: Self-pay | Admitting: Student

## 2024-03-19 LAB — LIPID PANEL
Chol/HDL Ratio: 3 ratio (ref 0.0–5.0)
Cholesterol, Total: 115 mg/dL (ref 100–199)
HDL: 38 mg/dL — ABNORMAL LOW (ref >39)
LDL Chol Calc (NIH): 51 mg/dL (ref 0–99)
Triglycerides: 150 mg/dL — ABNORMAL HIGH (ref 0–149)
VLDL Cholesterol Cal: 26 mg/dL (ref 5–40)

## 2024-03-24 ENCOUNTER — Ambulatory Visit: Payer: BC Managed Care – PPO

## 2024-03-24 DIAGNOSIS — R55 Syncope and collapse: Secondary | ICD-10-CM | POA: Diagnosis not present

## 2024-03-25 LAB — CUP PACEART REMOTE DEVICE CHECK
Date Time Interrogation Session: 20250714095100
Implantable Pulse Generator Implant Date: 20241206
Pulse Gen Serial Number: 131450

## 2024-03-29 ENCOUNTER — Ambulatory Visit: Payer: Self-pay | Admitting: Cardiology

## 2024-04-09 ENCOUNTER — Other Ambulatory Visit

## 2024-04-09 DIAGNOSIS — E118 Type 2 diabetes mellitus with unspecified complications: Secondary | ICD-10-CM

## 2024-04-10 ENCOUNTER — Ambulatory Visit: Payer: Self-pay | Admitting: Family

## 2024-04-10 LAB — COMPREHENSIVE METABOLIC PANEL WITH GFR
ALT: 18 IU/L (ref 0–44)
AST: 14 IU/L (ref 0–40)
Albumin: 4.5 g/dL (ref 3.9–4.9)
Alkaline Phosphatase: 85 IU/L (ref 44–121)
BUN/Creatinine Ratio: 22 (ref 10–24)
BUN: 19 mg/dL (ref 8–27)
Bilirubin Total: 0.4 mg/dL (ref 0.0–1.2)
CO2: 19 mmol/L — ABNORMAL LOW (ref 20–29)
Calcium: 10 mg/dL (ref 8.6–10.2)
Chloride: 102 mmol/L (ref 96–106)
Creatinine, Ser: 0.85 mg/dL (ref 0.76–1.27)
Globulin, Total: 2.8 g/dL (ref 1.5–4.5)
Glucose: 123 mg/dL — ABNORMAL HIGH (ref 70–99)
Potassium: 4.1 mmol/L (ref 3.5–5.2)
Sodium: 140 mmol/L (ref 134–144)
Total Protein: 7.3 g/dL (ref 6.0–8.5)
eGFR: 98 mL/min/1.73 (ref >59)

## 2024-04-10 LAB — HEMOGLOBIN A1C
Est. average glucose Bld gHb Est-mCnc: 148 mg/dL
Hgb A1c MFr Bld: 6.8 % — ABNORMAL HIGH (ref 4.8–5.6)

## 2024-04-15 NOTE — Progress Notes (Signed)
 Carelink Summary Report / Loop Recorder

## 2024-04-15 NOTE — Addendum Note (Signed)
 Addended by: VICCI SELLER A on: 04/15/2024 12:53 PM   Modules accepted: Orders

## 2024-04-17 ENCOUNTER — Ambulatory Visit: Admitting: Family

## 2024-04-17 ENCOUNTER — Encounter: Payer: Self-pay | Admitting: Family

## 2024-04-17 VITALS — BP 102/82 | HR 94 | Temp 98.1°F | Ht 74.0 in | Wt 263.2 lb

## 2024-04-17 DIAGNOSIS — M25511 Pain in right shoulder: Secondary | ICD-10-CM

## 2024-04-17 DIAGNOSIS — I4891 Unspecified atrial fibrillation: Secondary | ICD-10-CM

## 2024-04-17 DIAGNOSIS — E118 Type 2 diabetes mellitus with unspecified complications: Secondary | ICD-10-CM

## 2024-04-17 DIAGNOSIS — Z125 Encounter for screening for malignant neoplasm of prostate: Secondary | ICD-10-CM | POA: Diagnosis not present

## 2024-04-17 DIAGNOSIS — Z7984 Long term (current) use of oral hypoglycemic drugs: Secondary | ICD-10-CM

## 2024-04-17 DIAGNOSIS — G8929 Other chronic pain: Secondary | ICD-10-CM

## 2024-04-17 DIAGNOSIS — M25512 Pain in left shoulder: Secondary | ICD-10-CM

## 2024-04-17 NOTE — Assessment & Plan Note (Signed)
 Largely chronic. Reviewed prior MRI cervical spine. D/d AC joint arthritis, bursitis, rotator cuff tendinopathy. Discussed updating images with shoulder xray and cervical xrays; he politely declines at this time.  Discussed scheduling tylenol  650mg , icing and exercises printed for him. He understands to avoid NSAIDs.

## 2024-04-17 NOTE — Assessment & Plan Note (Signed)
Chronic, stable. Continue metoprolol tartrate 25 mg bid. Will continue consider ARB for renal protection setting of DM

## 2024-04-17 NOTE — Patient Instructions (Addendum)
 As discussed, let's start by scheduling Tylenol  Arthritis which is a 650mg  tablet .   You may take 1 tablet every daily as a starting point  maximum of 6 tablets per day. Most adults can safely take 4 pills total per day of Tylenol  Arthritis 650mg  tablet.   For example , you could take two tablets in the morning ( 8am) and then two tablets again at 4pm.   Maximum daily dose of acetaminophen  4 g per day from all sources.  If you are taking another medication which includes acetaminophen  (Tylenol ) which may be in cough and cold preparations or pain medication such as Percocet, you will need to factor that into your total daily dose to be safe.  Please let me know if any questions  A great article regarding how to safely take and dose tylenol  found below.  Title : 'Acetaminophen  safety: Be cautious but not afraid'  https://www.health.[employer].edu/pain/acetaminophen -safety-be-cautious-but-not-afraid   Let me know if you would like a referral to physical therapy.

## 2024-04-17 NOTE — Assessment & Plan Note (Signed)
 A1c improved.  Continue dapagliflozin  - metformin  5-1000mg  two tablets QD , rybelsus  14 mg qd .

## 2024-04-17 NOTE — Progress Notes (Signed)
 Assessment & Plan:  DM type 2, controlled, with complication (HCC) Assessment & Plan: A1c improved.  Continue dapagliflozin  - metformin  5-1000mg  two tablets QD , rybelsus  14 mg qd .   Orders: -     CBC with Differential/Platelet -     Hemoglobin A1c -     Microalbumin / creatinine urine ratio  Atrial fibrillation, unspecified type (HCC) -     CBC with Differential/Platelet  Chronic pain of both shoulders Assessment & Plan: Largely chronic. Reviewed prior MRI cervical spine. D/d AC joint arthritis, bursitis, rotator cuff tendinopathy. Discussed updating images with shoulder xray and cervical xrays; he politely declines at this time.  Discussed scheduling tylenol  650mg , icing and exercises printed for him. He understands to avoid NSAIDs.    Screening for prostate cancer -     PSA     Return precautions given.   Risks, benefits, and alternatives of the medications and treatment plan prescribed today were discussed, and patient expressed understanding.   Education regarding symptom management and diagnosis given to patient on AVS either electronically or printed.  Return in about 3 months (around 07/18/2024).  Rollene Northern, [name]  Subjective:    Patient ID: Dustin Fletcher, Dustin Fletcher    DOB: 01/29/1961, 63 y.o.   MRN: [id number]  CC: Dustin Fletcher is a 63 y.o. Dustin Fletcher who presents today for follow up.   HPI: Complains of BL shoulder pain and posterior neck pain for years , unchanged  Pain with reaching right arm and lateral arm raise.  Describes as ache.   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   No trauma, falls.  Right handed  He hasn't tried any medication for pain.   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 There is potential for neural compression at these levels, but  the foraminal stenosis is  considerably more pronounced on the right  than the left. I do not identify an acute soft disc herniation.   Allergies: Patient has no known allergies. Current Outpatient Medications on File Prior to Visit  Medication Sig Dispense Refill   blood glucose meter kit and supplies KIT Dispense based on patient and insurance preference. Use up to four times daily as directed. (FOR ICD-9 250.00, 250.01). 1 each 0   cholecalciferol  (VITAMIN D3) 25 MCG (1000 UNIT) tablet Take 1,000 Units by mouth 2 (two) times daily.     ELDERBERRY PO Take 1 tablet by mouth 2 (two) times daily.     ELIQUIS  5 MG TABS tablet TAKE 1 TABLET BY MOUTH 2 TIMES A DAY 60 tablet 5   Omega-3 Fatty Acids (FISH OIL) 1000 MG CAPS Take 1,000-2,000 mg by mouth See admin instructions. Take 2000 mg in morning and 1000 mg at bedtime     rosuvastatin  (CRESTOR ) 10 MG tablet Take 1 tablet (10 mg total) by mouth daily. 90 tablet 3   [name]  14 MG TABS TAKE 1 TABLET BY MOUTH DAILY 90 tablet 1   vitamin B-12 (CYANOCOBALAMIN ) 1000 MCG tablet Take 1,000 mcg by mouth daily.     XIGDUO  XR 01-999 MG TB24 TAKE 2 TABLETS BY MOUTH EVERY MORNING 180 tablet 3   metoprolol  tartrate (LOPRESSOR ) 25 MG tablet Take 1 tablet (25 mg total) by mouth 2 (two) times daily. 180 tablet 3   No current facility-administered medications on file prior to visit.  Review of Systems  Constitutional:  Negative for chills and fever.  Respiratory:  Negative for cough.   Cardiovascular:  Negative for chest pain and palpitations.  Gastrointestinal:  Negative for nausea and vomiting.  Musculoskeletal:  Positive for arthralgias and neck pain.  Neurological:  Negative for numbness.      Objective:    BP 102/82 (BP Location: Left Arm, Patient Position: Sitting, Cuff Size: Normal)   Pulse 94   Temp 98.1 F (36.7 C) (Oral)   Ht 6' 2 (1.88 m)   Wt 263 lb 3.2 oz (119.4 kg)   SpO2 99%   BMI 33.79 kg/m  BP Readings from Last 3 Encounters:  04/17/24 102/82  02/28/24  108/72  01/17/24 120/62   Wt Readings from Last 3 Encounters:  04/17/24 263 lb 3.2 oz (119.4 kg)  02/28/24 256 lb 12.8 oz (116.5 kg)  01/17/24 261 lb 3.2 oz (118.5 kg)    Physical Exam Vitals reviewed.  Constitutional:      Appearance: He is well-developed.  Cardiovascular:     Rate and Rhythm: Regular rhythm.     Heart sounds: Normal heart sounds.  Pulmonary:     Effort: Pulmonary effort is normal. No respiratory distress.     Breath sounds: Normal breath sounds. No wheezing, rhonchi or rales.  Musculoskeletal:     Right shoulder: Bony tenderness present. No swelling. Normal range of motion. Normal pulse.     Left shoulder: Bony tenderness present. Normal range of motion. Normal pulse.       Arms:     Cervical back: No swelling, rigidity, spasms, torticollis, tenderness, bony tenderness or crepitus. No pain with movement.     Comments: Right and Left Shoulder:   No asymmetry of shoulders when comparing right and left.Mild pain over AC joint.  No pain with internal and external rotation. No pain with resisted lateral extension .   Negative active painful arc sign. Negative passive arc ( Neer's). Negative drop arm.  Strength and sensation normal BUE's.   Skin:    General: Skin is warm and dry.  Neurological:     Mental Status: He is alert.  Psychiatric:        Speech: Speech normal.        Behavior: Behavior normal.

## 2024-04-24 ENCOUNTER — Ambulatory Visit (INDEPENDENT_AMBULATORY_CARE_PROVIDER_SITE_OTHER): Payer: BC Managed Care – PPO

## 2024-04-24 DIAGNOSIS — R55 Syncope and collapse: Secondary | ICD-10-CM

## 2024-04-28 LAB — CUP PACEART REMOTE DEVICE CHECK
Date Time Interrogation Session: 20250815091600
Implantable Pulse Generator Implant Date: 20241206
Pulse Gen Serial Number: 131450

## 2024-04-30 ENCOUNTER — Other Ambulatory Visit: Payer: Self-pay | Admitting: Cardiovascular Disease

## 2024-04-30 NOTE — Telephone Encounter (Signed)
 Prescription refill request for Eliquis  received. Indication:aflutter Last office visit:6/25 Scr:0.85  7/25 Age: 63 Weight:119.4  kg  Prescription refilled

## 2024-05-02 ENCOUNTER — Ambulatory Visit: Payer: Self-pay | Admitting: Cardiology

## 2024-05-26 ENCOUNTER — Ambulatory Visit (INDEPENDENT_AMBULATORY_CARE_PROVIDER_SITE_OTHER): Payer: BC Managed Care – PPO

## 2024-05-26 DIAGNOSIS — R55 Syncope and collapse: Secondary | ICD-10-CM | POA: Diagnosis not present

## 2024-05-28 LAB — CUP PACEART REMOTE DEVICE CHECK
Date Time Interrogation Session: 20250916130400
Implantable Pulse Generator Implant Date: 20241206
Pulse Gen Serial Number: 131450

## 2024-05-29 ENCOUNTER — Ambulatory Visit: Payer: Self-pay | Admitting: Cardiology

## 2024-06-02 NOTE — Progress Notes (Signed)
 Remote Loop Recorder Transmission

## 2024-06-05 NOTE — Progress Notes (Signed)
 Remote Loop Recorder Transmission

## 2024-06-10 ENCOUNTER — Ambulatory Visit: Admitting: Cardiology

## 2024-06-17 NOTE — Progress Notes (Unsigned)
 Electrophysiology Clinic Note    Date:  06/18/2024  Patient ID:  Dustin Fletcher, Dustin Fletcher [birth year], MRN [id number] PCP:  Dineen Rollene MATSU, [name]  Cardiologist:  Deatrice Cage, MD  Cardiology APP:  Lorene Lesley CROME, PA-C  Electrophysiologist:  OLE ONEIDA HOLTS, MD  Electrophysiology APP:  Ruqayya Ventress, NP     Discussed the use of AI scribe software for clinical note transcription with the patient, who gave verbal consent to proceed.   Patient Profile    Chief Complaint: AFib, flutter follow-up  History of Present Illness: Dustin Fletcher is a 63 y.o. male with PMH notable for parox AFib, aflutter, non-obs CAD, T2DM, HTN ; seen today for OLE ONEIDA HOLTS, MD for routine electrophysiology followup.   He is s/p aflutter ablation w CTI on 08/17/2023. He had an ILR implanted at the same time for arrhythmia monitoring. Subsequently, he was diagnosed with influenza and had episode of syncope x 2 with a 6 second pause detected on ILR. EP evaluated while in hospital and thought both episodes vagally mediated. He was then subsequently diagnosed with covid-19.  I last saw him 01/2024 where he was doing well, fully recovered from flu and covid, back to work full time.   On follow-up today, he is not aware of any further AF episodes. He denies chest pain, chest pressure, palpitations. He continues to take eliquis  BID, no bleeding concerns. He does have superficial cuts to hands, but says they clot relatively quickly.        Arrhythmia/Device History Bos Sci ILR, imp 08/2023; dx Afib monitoring    ROS:  Please see the history of present illness. All other systems are reviewed and otherwise negative.    Physical Exam    VS:  BP 122/82 (BP Location: Left Arm, Patient Position: Sitting, Cuff Size: Normal)   Pulse 83   Ht 6' 2 (1.88 m)   Wt 263 lb 9.6 oz (119.6 kg)   SpO2 98%   BMI 33.84 kg/m  BMI: Body mass index is 33.84 kg/m.    Wt Readings from Last 3 Encounters:   06/18/24 263 lb 9.6 oz (119.6 kg)  04/17/24 263 lb 3.2 oz (119.4 kg)  02/28/24 256 lb 12.8 oz (116.5 kg)     GEN- The patient is well appearing, alert and oriented x 3 today.   Lungs- Clear to ausculation bilaterally, normal work of breathing.  Heart- Regular rate and rhythm, no murmurs, rubs or gallops Extremities- No peripheral edema, warm, dry    Remote ILR reviewed by myself:  No AFib episodes since 2/20225    Studies Reviewed   Previous EP, cardiology notes.    EKG is not ordered. Personal review of EKG from 02/28/2024 shows:  SR at 89        TTE, 10/20/2023  1. Left ventricular ejection fraction, by estimation, is 60 to 65%. The left ventricle has normal function. The left ventricle has no regional wall motion abnormalities. There is moderate left ventricular hypertrophy of the basal-septal segment. Left ventricular diastolic parameters were normal.   2. Right ventricular systolic function is normal. The right ventricular size is normal.   3. The mitral valve is normal in structure. No evidence of mitral valve regurgitation. No evidence of mitral stenosis.   4. The aortic valve is tricuspid. Aortic valve regurgitation is not visualized. Aortic valve sclerosis/calcification is present, without any evidence of aortic stenosis. Aortic valve area, by VTI measures 3.44 cm. Aortic valve mean gradient measures 2.0  mmHg. Aortic valve Vmax measures 0.97 m/s.    Coronary CTA, 03/26/2023 1. Coronary calcium  score of 410. This was 87th percentile for age and sex matched control.   2. Normal coronary origin with right dominance.  3. Mild proximal RCA stenosis (25-49%).  4. Minimal proximal LAD stenosis (<25%).  5. CAD-RADS 2. Mild non-obstructive CAD (25-49%). Consider non-atherosclerotic causes of chest pain. Consider preventive therapy and risk factor modification.   Long term monitor, 02/22/2023 Patient had a min HR of 31 bpm, max HR of 214 bpm, and avg HR of 86 bpm. Predominant  underlying rhythm was Sinus Rhythm. First Degree AV Block was present. 2 Ventricular Tachycardia runs occurred, the run with the fastest interval lasting 10 beats with a  max rate of 214 bpm (avg 179 bpm); the run with the fastest interval was also the longest. 82 Supraventricular Tachycardia runs occurred, the run with the fastest interval lasting 6 beats with a max rate of 200 bpm, the longest lasting 23 mins 34 secs with an avg rate of 114 bpm. Atrial Flutter occurred (<1% burden), ranging from 86-168 bpm (avg of 147 bpm), the longest lasting 48 mins 3 secs with an avg rate of 147 bpm. Second Degree AV Block-Mobitz I (Wenckebach) was present. Supraventricular Tachycardia and Atrial Flutter were detected within +/- 45 seconds of symptomatic patient event(s).  Rare PACs and rare PVCs.   Assessment and Plan     #) aflutter  parox afib #) ILR in situ S/p Aflutter ablation 08/2023 with reoccurrence afterward iso flu diagnosis No recent Aflutter episodes by ILR Continue 25mg  lopressor  BID  #) Hypercoag d/t parox afib CHA2DS2-VASc Score = at least 3 [CHF History: 0, HTN History: 1, Diabetes History: 1, Stroke History: 0, Vascular Disease History: 1, Age Score: 0, Gender Score: 0].  Therefore, the patient's annual risk of stroke is 3.2 %.    Stroke ppx - 5mg  eliquis  BID, appropriately dosed We discussed whether to continue eliquis  vs stopping with ILR in place monitoring At this time, he will continue eliquis  given affordability and lack of bleeding concerns. If he reconsiders and stops, would recommend starting 81mg  ASA        Current medicines are reviewed at length with the patient today.   The patient does not have concerns regarding his medicines.  The following changes were made today:  none  Labs/ tests ordered today include:  No orders of the defined types were placed in this encounter.    Disposition: Follow up with EP Team or EP APP in 12-7months  Continue monthly ILR  monitoring    Signed, Chantal Needle, NP  06/18/24  10:29 AM  Electrophysiology [facility]

## 2024-06-18 ENCOUNTER — Ambulatory Visit: Attending: Cardiology | Admitting: Cardiology

## 2024-06-18 VITALS — BP 122/82 | HR 83 | Ht 74.0 in | Wt 263.6 lb

## 2024-06-18 DIAGNOSIS — I4892 Unspecified atrial flutter: Secondary | ICD-10-CM | POA: Diagnosis not present

## 2024-06-18 DIAGNOSIS — Z95818 Presence of other cardiac implants and grafts: Secondary | ICD-10-CM | POA: Diagnosis not present

## 2024-06-18 DIAGNOSIS — D6869 Other thrombophilia: Secondary | ICD-10-CM | POA: Diagnosis not present

## 2024-06-18 DIAGNOSIS — I480 Paroxysmal atrial fibrillation: Secondary | ICD-10-CM | POA: Diagnosis not present

## 2024-06-18 NOTE — Patient Instructions (Signed)
 Medication Instructions:  Your physician recommends that you continue on your current medications as directed. Please refer to the Current Medication list given to you today.   *If you need a refill on your cardiac medications before your next appointment, please call your pharmacy*  Lab Work: No labs ordered today  If you have labs (blood work) drawn today and your tests are completely normal, you will receive your results only by: MyChart Message (if you have MyChart) OR A paper copy in the mail If you have any lab test that is abnormal or we need to change your treatment, we will call you to review the results.  Testing/Procedures: No test ordered today   Follow-Up: At [facility], you and your health needs are our priority.  As part of our continuing mission to provide you with exceptional heart care, our providers are all part of one team.  This team includes your primary Cardiologist (physician) and Advanced Practice Providers or APPs (Physician Assistants and Nurse Practitioners) who all work together to provide you with the care you need, when you need it.  Your next appointment:   1 year(s)  Provider:   Suzann Riddle, NP    We recommend signing up for the patient portal called MyChart.  Sign up information is provided on this After Visit Summary.  MyChart is used to connect with patients for Virtual Visits (Telemedicine).  Patients are able to view lab/test results, encounter notes, upcoming appointments, etc.  Non-urgent messages can be sent to your provider as well.   To learn more about what you can do with MyChart, go to [email or web address].

## 2024-06-18 NOTE — Progress Notes (Signed)
 Remote Loop Recorder Transmission

## 2024-06-19 ENCOUNTER — Ambulatory Visit: Admitting: Cardiology

## 2024-06-26 ENCOUNTER — Ambulatory Visit: Payer: BC Managed Care – PPO

## 2024-06-26 DIAGNOSIS — R55 Syncope and collapse: Secondary | ICD-10-CM

## 2024-06-27 LAB — CUP PACEART REMOTE DEVICE CHECK
Date Time Interrogation Session: 20251016144100
Implantable Pulse Generator Implant Date: 20241206
Pulse Gen Serial Number: 131450

## 2024-06-30 ENCOUNTER — Ambulatory Visit: Payer: Self-pay | Admitting: Cardiology

## 2024-07-03 NOTE — Progress Notes (Signed)
 Remote Loop Recorder Transmission

## 2024-07-15 ENCOUNTER — Other Ambulatory Visit: Payer: Self-pay

## 2024-07-15 DIAGNOSIS — I480 Paroxysmal atrial fibrillation: Secondary | ICD-10-CM

## 2024-07-15 DIAGNOSIS — Z125 Encounter for screening for malignant neoplasm of prostate: Secondary | ICD-10-CM

## 2024-07-15 DIAGNOSIS — E118 Type 2 diabetes mellitus with unspecified complications: Secondary | ICD-10-CM

## 2024-07-16 ENCOUNTER — Other Ambulatory Visit

## 2024-07-16 DIAGNOSIS — L728 Other follicular cysts of the skin and subcutaneous tissue: Secondary | ICD-10-CM | POA: Diagnosis not present

## 2024-07-16 DIAGNOSIS — C44329 Squamous cell carcinoma of skin of other parts of face: Secondary | ICD-10-CM | POA: Diagnosis not present

## 2024-07-16 DIAGNOSIS — L711 Rhinophyma: Secondary | ICD-10-CM | POA: Diagnosis not present

## 2024-07-16 DIAGNOSIS — L719 Rosacea, unspecified: Secondary | ICD-10-CM | POA: Diagnosis not present

## 2024-07-16 DIAGNOSIS — I480 Paroxysmal atrial fibrillation: Secondary | ICD-10-CM

## 2024-07-16 DIAGNOSIS — L538 Other specified erythematous conditions: Secondary | ICD-10-CM | POA: Diagnosis not present

## 2024-07-16 DIAGNOSIS — D485 Neoplasm of uncertain behavior of skin: Secondary | ICD-10-CM | POA: Diagnosis not present

## 2024-07-16 DIAGNOSIS — Z125 Encounter for screening for malignant neoplasm of prostate: Secondary | ICD-10-CM

## 2024-07-16 DIAGNOSIS — E118 Type 2 diabetes mellitus with unspecified complications: Secondary | ICD-10-CM

## 2024-07-17 ENCOUNTER — Ambulatory Visit: Payer: Self-pay | Admitting: Family

## 2024-07-17 DIAGNOSIS — E119 Type 2 diabetes mellitus without complications: Secondary | ICD-10-CM

## 2024-07-17 LAB — CBC WITH DIFFERENTIAL/PLATELET
Basophils Absolute: 0 x10E3/uL (ref 0.0–0.2)
Basos: 1 %
EOS (ABSOLUTE): 0.1 x10E3/uL (ref 0.0–0.4)
Eos: 2 %
Hematocrit: 43.6 % (ref 37.5–51.0)
Hemoglobin: 15.1 g/dL (ref 13.0–17.7)
Immature Grans (Abs): 0 x10E3/uL (ref 0.0–0.1)
Immature Granulocytes: 0 %
Lymphocytes Absolute: 2.3 x10E3/uL (ref 0.7–3.1)
Lymphs: 39 %
MCH: 31.7 pg (ref 26.6–33.0)
MCHC: 34.6 g/dL (ref 31.5–35.7)
MCV: 91 fL (ref 79–97)
Monocytes Absolute: 0.6 x10E3/uL (ref 0.1–0.9)
Monocytes: 10 %
Neutrophils Absolute: 2.9 x10E3/uL (ref 1.4–7.0)
Neutrophils: 48 %
Platelets: 223 x10E3/uL (ref 150–450)
RBC: 4.77 x10E6/uL (ref 4.14–5.80)
RDW: 13.7 % (ref 11.6–15.4)
WBC: 6 x10E3/uL (ref 3.4–10.8)

## 2024-07-17 LAB — MICROALBUMIN / CREATININE URINE RATIO
Creatinine, Urine: 65.4 mg/dL
Microalb/Creat Ratio: 15 mg/g{creat} (ref 0–29)
Microalbumin, Urine: 9.8 ug/mL

## 2024-07-17 LAB — PSA: Prostate Specific Ag, Serum: 0.2 ng/mL (ref 0.0–4.0)

## 2024-07-17 LAB — HEMOGLOBIN A1C
Est. average glucose Bld gHb Est-mCnc: 177 mg/dL
Hgb A1c MFr Bld: 7.8 % — ABNORMAL HIGH (ref 4.8–5.6)

## 2024-07-24 ENCOUNTER — Ambulatory Visit: Admitting: Family

## 2024-07-24 ENCOUNTER — Encounter: Payer: Self-pay | Admitting: Family

## 2024-07-24 VITALS — BP 124/80 | HR 88 | Temp 98.3°F | Ht 74.0 in | Wt 271.0 lb

## 2024-07-24 DIAGNOSIS — Z7984 Long term (current) use of oral hypoglycemic drugs: Secondary | ICD-10-CM | POA: Diagnosis not present

## 2024-07-24 DIAGNOSIS — E118 Type 2 diabetes mellitus with unspecified complications: Secondary | ICD-10-CM | POA: Diagnosis not present

## 2024-07-24 DIAGNOSIS — I10 Essential (primary) hypertension: Secondary | ICD-10-CM

## 2024-07-24 MED ORDER — RYBELSUS 14 MG PO TABS: 1.0000 | ORAL_TABLET | Freq: Every day | ORAL | 3 refills | Status: AC

## 2024-07-24 NOTE — Progress Notes (Signed)
 Assessment & Plan:  Primary hypertension Assessment & Plan: Chronic, stable. Continue metoprolol  tartrate 25 mg bid.  No significant microalbuminemia.  Will continue to monitor   DM type 2, controlled, with complication (HCC) Assessment & Plan: A1c increased.  Discussed escalation of diabetic regimen.  Patient politely declines at this time and would prefer to focus on diet.  Long discussion as it relates to blood glucose monitoring during this interval to ensure blood sugar is not increasing.  Advised him to check fasting blood sugar couple of days per week.  Instructions given on after visit summary. Continue dapagliflozin  - metformin  5-1000mg  two tablets QD , rybelsus  14 mg qd for now.   Orders: -     [name] ; Take 1 tablet (14 mg total) by mouth daily.  Dispense: 90 tablet; Refill: 3 -     Hemoglobin A1c -     Comprehensive metabolic panel with GFR     Return precautions given.   Risks, benefits, and alternatives of the medications and treatment plan prescribed today were discussed, and patient expressed understanding.   Education regarding symptom management and diagnosis given to patient on AVS either electronically or printed.  Return in about 3 months (around 10/24/2024).  Rollene Northern, [name]  Subjective:    Patient ID: MUHANAD TOROSYAN, male    DOB: 10-09-1960, 63 y.o.   MRN: [id number]  CC: JAKELL TRUSTY is a 63 y.o. male who presents today for follow up.   HPI: HPI Discussed the use of AI scribe software for clinical note transcription with the patient, who gave verbal consent to proceed.  History of Present Illness   Caydence Enck is a 63 year old male with diabetes who presents for follow-up of DM.  Feels well today.  No new complaints.  His A1c has increased from 6.8 in July to 7.8 currently. He has not noticed any significant changes in his diet or lifestyle, although he acknowledges eating more carbohydrates, such as cereal with fruit  and blueberry muffins. He is not currently using a glucometer but has one at home.   Denies shortness of breath, leg swelling, chest pain, or bleeding issues.   He remains compliant with Eliquis .  Compliant with dapagliflozin  - metformin  5-1000mg  two tablets QD , rybelsus  14 mg qd   Allergies: Patient has no known allergies. Current Outpatient Medications on File Prior to Visit  Medication Sig Dispense Refill   blood glucose meter kit and supplies KIT Dispense based on patient and insurance preference. Use up to four times daily as directed. (FOR ICD-9 250.00, 250.01). 1 each 0   cholecalciferol  (VITAMIN D3) 25 MCG (1000 UNIT) tablet Take 1,000 Units by mouth 2 (two) times daily.     ELDERBERRY PO Take 1 tablet by mouth 2 (two) times daily.     ELIQUIS  5 MG TABS tablet TAKE 1 TABLET BY MOUTH 2 TIMES A DAY 60 tablet 5   metoprolol  tartrate (LOPRESSOR ) 25 MG tablet Take 1 tablet (25 mg total) by mouth 2 (two) times daily. 180 tablet 3   Omega-3 Fatty Acids (FISH OIL) 1000 MG CAPS Take 1,000-2,000 mg by mouth See admin instructions. Take 2000 mg in morning and 1000 mg at bedtime     rosuvastatin  (CRESTOR ) 10 MG tablet Take 1 tablet (10 mg total) by mouth daily. 90 tablet 3   vitamin B-12 (CYANOCOBALAMIN ) 1000 MCG tablet Take 1,000 mcg by mouth daily.     XIGDUO  XR 01-999 MG TB24 TAKE 2 TABLETS BY  MOUTH EVERY MORNING 180 tablet 3   No current facility-administered medications on file prior to visit.    Review of Systems  Constitutional:  Negative for chills and fever.  Respiratory:  Negative for cough.   Cardiovascular:  Negative for chest pain and palpitations.  Gastrointestinal:  Negative for nausea and vomiting.      Objective:    BP 124/80   Pulse 88   Temp 98.3 F (36.8 C) (Oral)   Ht 6' 2 (1.88 m)   Wt 271 lb (122.9 kg)   SpO2 95%   BMI 34.79 kg/m  BP Readings from Last 3 Encounters:  07/24/24 124/80  06/18/24 122/82  04/17/24 102/82   Wt Readings from Last 3  Encounters:  07/24/24 271 lb (122.9 kg)  06/18/24 263 lb 9.6 oz (119.6 kg)  04/17/24 263 lb 3.2 oz (119.4 kg)    Physical Exam Vitals reviewed.  Constitutional:      Appearance: He is well-developed.  Cardiovascular:     Rate and Rhythm: Regular rhythm.     Heart sounds: Normal heart sounds.  Pulmonary:     Effort: Pulmonary effort is normal. No respiratory distress.     Breath sounds: Normal breath sounds. No wheezing, rhonchi or rales.  Skin:    General: Skin is warm and dry.  Neurological:     Mental Status: He is alert.  Psychiatric:        Speech: Speech normal.        Behavior: Behavior normal.

## 2024-07-24 NOTE — Patient Instructions (Signed)
 Goal of fasting blood sugar is between 70-120. If in this range, we are reaching our target a1c ( goal 6.5%)   Please check fasting blood sugar in the morning time once or twice a week.  You may also check if you feel like you are having a low episode or particularly high episode of blood sugar.  If blood sugars increase, I may advise you to check blood sugar after your largest meal.  You specifically do this TWO hours after largest meal with the goal of being less than 180.  If blood sugar is checked sooner than 2 hours after largest meal, and it will be  expected to be elevated. You must wait 2 hours.   If your blood sugar is less than 180 hours after your largest meal, again we are reaching our target a1c goal   Call [facility] clinic if: BG < 70 or > 300.   If you have any symptoms of low blood sugar ( sweating, shakiness, lightheaded, dizzy) that you notify me. If you have a low, please drink a glass of orange juice and recheck blood sugar every 5 minutes until you don't feel symptomatic AND blood sugar is above 80.    This is  Dr. Lula  example of a  Low GI  Diet:  It will allow you to lose 4 to 8  lbs  per month if you follow it carefully.  Your goal with exercise is a minimum of 30 minutes of aerobic exercise 5 days per week (Walking does not count once it becomes easy!)    All of the foods can be found at grocery stores and in bulk at [employer].  The Atkins protein bars and shakes are available in more varieties at [employer], [employer] and [employer].     7 AM Breakfast:  Choose from the following:  Low carbohydrate Protein  Shakes (I recommend the  Premier Protein chocolate shakes,  EAS AdvantEdge Carb Control shakes  Or the Atkins shakes all are under 3 net carbs)     a scrambled egg/bacon/cheese burrito made with Mission's carb balance whole wheat tortilla  (about 10 net carbs )  Medical Laboratory Scientific Officer (basically a quiche without the pastry crust) that  is eaten cold and very convenient way to get your eggs.  8 carbs)  If you make your own protein shakes, avoid bananas and pineapple,  And use low carb greek yogurt or original /unsweetened almond or soy milk    Avoid cereal and bananas, oatmeal and cream of wheat and grits. They are loaded with carbohydrates!   10 AM: high protein snack:  Protein bar by Atkins (the snack size, under 200 cal, usually < 6 net carbs).    A stick of cheese:  Around 1 carb,  100 cal     Dannon Light n Fit Greek Yogurt  (80 cal, 8 carbs)  Other so called protein bars and Greek yogurts tend to be loaded with carbohydrates.  Remember, in food advertising, the word energy is synonymous for  carbohydrate.  Lunch:   A Sandwich using the bread choices listed, Can use any  Eggs,  lunchmeat, grilled meat or canned tuna), avocado, regular mayo/mustard  and cheese.  A Salad using blue cheese, ranch,  Goddess or vinagrette,  Avoid taco shells, croutons or confetti and no candied nuts but regular nuts OK.   No pretzels, nabs  or chips.  Pickles and miniature sweet peppers are a good low  carb alternative that provide a crunch  The bread is the only source of carbohydrate in a sandwich and  can be decreased by trying some of the attached alternatives to traditional loaf bread   Avoid Low fat dressings, as well as [place] and [address] dressings They are loaded with sugar!   3 PM/ Mid day  Snack:  Consider  1 ounce of  almonds, walnuts, pistachios, pecans, peanuts,  Macadamia nuts or a nut medley.  Avoid granola and granola bars   Mixed nuts are ok in moderation as long as there are no raisins,  cranberries or dried fruit.   KIND bars are OK if you get the low glycemic index variety   Try the prosciutto/mozzarella cheese sticks by [employer]  In deli /backery section   High protein      6 PM  Dinner:     Meat/fowl/fish with a green salad, and either broccoli, cauliflower, green beans, spinach, brussel  sprouts or  Lima beans. DO NOT BREAD THE PROTEIN!!      There is a low carb pasta by Dreamfield's that is acceptable and tastes great: only 5 digestible carbs/serving.( All grocery stores but BJs carry it ) Several ready made meals are available low carb:   Try Michel Angelo's chicken piccata or chicken or eggplant parm over low carb pasta.(Lowes and BJs)   [place] Carnitas (pulled pork, no sauce,  0 carbs) or his beef pot roast to make a dinner burrito (at [employer])  Pesto over low carb pasta (bj's sells a good quality pesto in the center refrigerated section of the deli   Try satueeing  Glenna Blonder with mushroooms as a good side   [employer] makes a mashed cauliflower that tastes like mashed potatoes  Whole wheat pasta is still full of digestible carbs and  Not as low in glycemic index as Dreamfield's.   Brown rice is still rice,  So skip the rice and noodles if you eat Chinese or Thai (or at least limit to 1/2 cup)  9 PM snack :   Breyer's low carb fudgsicle or  ice cream bar (Carb Smart line), or  Weight Watcher's ice cream bar , or another no sugar added ice cream;  a serving of fresh berries/cherries with whipped cream   Cheese or DANNON'S LlGHT N FIT GREEK YOGURT  8 ounces of [place] unsweetened almond/cococunut milk    Treat yourself to a parfait made with whipped cream blueberiies, walnuts and vanilla greek yogurt  Avoid bananas, pineapple, grapes  and watermelon on a regular basis because they are high in sugar.  THINK OF THEM AS DESSERT  Remember that snack Substitutions should be less than 10 NET carbs per serving and meals < 20 carbs. Remember to subtract fiber grams to get the net carbs.  Diabetes: Carbohydrate Counting for Adults Carbohydrate counting is a method of keeping track of how many carbohydrates you eat. Eating carbohydrates increases the amount of sugar, also called glucose, in your blood. By counting how many carbohydrates you eat, you can improve how  well you manage your blood sugar. This, in turn, helps you manage your diabetes. Carbohydrates are measured in grams (g) per serving. It's important to know how many carbohydrates (in grams or by serving size) you can have in each meal. This is different for every person. A dietitian can help you make a meal plan and calculate how many carbohydrates you should have at each meal and snack. What foods contain carbohydrates?  Carbohydrates are found in these foods: Grains, such as breads and cereals. Dried beans and soy products. Starchy vegetables, such as potatoes, peas, and corn. Fruit and fruit juices. Milk and yogurt. Sweets and snack foods like cake, cookies, candy, chips, and soft drinks. How do I count carbohydrates in foods? There are two ways to count carbohydrates in food. You can read food labels or learn standard serving sizes of foods. You can use either of these methods or a combination of both. Using the Nutrition Facts label The Nutrition Facts list is included on the labels of almost all packaged foods and drinks in the [place] It includes: The serving size. Information about nutrients in each serving. This includes the grams of carbohydrate per serving. To use the Nutrition Facts, decide how many servings you will have. Then, multiply the number of servings by the number of carbohydrates per serving. The resulting number is the total grams of carbohydrates that you'll be having. Learning the standard serving sizes of foods When you eat carbohydrate foods that aren't packaged or don't include Nutrition Facts on the label, you need to measure the servings in order to count the grams of carbohydrates. Measure the foods that you'll eat with a food scale or measuring cup, if needed. Decide how many standard-size servings you'll eat. Multiply the number of servings by 15. For foods that contain carbohydrates, one serving equals 15 g of carbohydrates. For example, if you eat 2 cups or 10  oz (300 g) of strawberries, you'll have eaten 2 servings and 30 g of carbohydrates (2 servings x 15 g = 30 g). For foods that have more than one food mixed, such as soups and casseroles, you must count the carbohydrates in each food that's included. Here's a list of standard serving sizes for common carbohydrate-rich foods. Each of these servings has about 15 g of carbohydrates: 1 slice of bread. 1 six-inch (15 cm) tortilla. ? cup or 2 oz (53 g) of cooked rice or pasta.  cup or 3 oz (85 g) of cooked or canned, drained, and rinsed beans or lentils.  cup or 3 oz (85 g) of a starchy vegetable, such as peas, corn, or squash.  cup or 4 oz (120 g) of hot cereal.  cup or 3 oz (85 g) of boiled or mashed potatoes, or  or 3 oz (85 g) of a large baked potato.  cup or 4 fl oz (118 mL) of fruit juice. 1 cup or 8 fl oz (237 mL) of milk. 1 small or 4 oz (106 g) apple.  or 2 oz (63 g) of a medium banana. 1 cup or 5 oz (150 g) of strawberries. 3 cups or 1 oz (28.3 g) of popped popcorn. What is an example of carbohydrate counting? To calculate the grams of carbohydrates in this sample meal, follow the steps below. Sample meal 3 oz (85 g) chicken breast. ? cup or 4 oz (106 g) of brown rice.  cup or 3 oz (85 g) of corn. 1 cup or 8 fl oz (237 mL) of milk. 1 cup or 5 oz (150 g) of strawberries with sugar-free whipped topping. Carbohydrate calculation Identify the foods that have carbohydrates: Rice. Corn. Milk. Strawberries. Calculate how many servings you have of each food: 2 servings of rice. 1 serving of corn. 1 serving of milk. 1 serving of strawberries. Multiply each number of servings by 15 g: 2 servings of rice x 15 g = 30 g. 1 serving of corn x 15  g = 15 g. 1 serving of milk x 15 g = 15 g. 1 serving of strawberries x 15 g = 15 g. Add together all of the amounts to find the total grams of carbohydrates eaten: 30 g + 15 g + 15 g + 15 g = 75 g of carbohydrates total. Where to find  more information To learn more, go to: American Diabetes Association at diabetes.org. Click Search and type carb counting. Find the link you need. Centers for Disease Control and Prevention at tonerpromos.no. Click Search and type diabetes. Find the link you need. Academy of Nutrition and Dietetics: eatright.org This information is not intended to replace advice given to you by your health care provider. Make sure you discuss any questions you have with your health care provider. Document Revised: 08/15/2023 Document Reviewed: 08/15/2023 Elsevier Patient Education  2025 [employer].

## 2024-07-26 DIAGNOSIS — S4351XA Sprain of right acromioclavicular joint, initial encounter: Secondary | ICD-10-CM | POA: Diagnosis not present

## 2024-07-27 ENCOUNTER — Ambulatory Visit: Attending: Cardiology

## 2024-07-27 DIAGNOSIS — I4892 Unspecified atrial flutter: Secondary | ICD-10-CM | POA: Diagnosis not present

## 2024-07-28 LAB — CUP PACEART REMOTE DEVICE CHECK
Date Time Interrogation Session: 20251116065000
Implantable Pulse Generator Implant Date: 20241206
Pulse Gen Serial Number: 131450

## 2024-07-28 NOTE — Assessment & Plan Note (Signed)
 Chronic, stable. Continue metoprolol  tartrate 25 mg bid.  No significant microalbuminemia.  Will continue to monitor

## 2024-07-28 NOTE — Assessment & Plan Note (Signed)
 A1c increased.  Discussed escalation of diabetic regimen.  Patient politely declines at this time and would prefer to focus on diet.  Long discussion as it relates to blood glucose monitoring during this interval to ensure blood sugar is not increasing.  Advised him to check fasting blood sugar couple of days per week.  Instructions given on after visit summary. Continue dapagliflozin  - metformin  5-1000mg  two tablets QD , rybelsus  14 mg qd for now.

## 2024-07-29 MED ORDER — BLOOD GLUCOSE MONITOR KIT: PACK | 0 refills | Status: AC

## 2024-07-29 MED ORDER — BLOOD GLUCOSE MONITORING SUPPL DEVI: 1.0000 | 0 refills | Status: AC

## 2024-07-29 MED ORDER — LANCETS MISC: 1.0000 | 0 refills | Status: AC

## 2024-07-29 MED ORDER — LANCET DEVICE MISC: 1.0000 | 0 refills | Status: AC

## 2024-07-29 MED ORDER — BLOOD GLUCOSE TEST VI STRP: 1.0000 | ORAL_STRIP | 0 refills | Status: AC

## 2024-07-30 NOTE — Progress Notes (Signed)
 Remote Loop Recorder Transmission

## 2024-08-03 ENCOUNTER — Ambulatory Visit: Payer: Self-pay | Admitting: Cardiology

## 2024-08-11 DIAGNOSIS — S4351XD Sprain of right acromioclavicular joint, subsequent encounter: Secondary | ICD-10-CM | POA: Diagnosis not present

## 2024-08-18 ENCOUNTER — Other Ambulatory Visit: Payer: Self-pay

## 2024-08-18 DIAGNOSIS — E118 Type 2 diabetes mellitus with unspecified complications: Secondary | ICD-10-CM

## 2024-08-20 ENCOUNTER — Other Ambulatory Visit: Payer: Self-pay | Admitting: Cardiovascular Disease

## 2024-08-20 DIAGNOSIS — E7800 Pure hypercholesterolemia, unspecified: Secondary | ICD-10-CM

## 2024-08-20 NOTE — Telephone Encounter (Signed)
Please contact pt for future appointment. 

## 2024-08-20 NOTE — Telephone Encounter (Signed)
 Called pt to schedule for medication refill

## 2024-08-27 ENCOUNTER — Ambulatory Visit

## 2024-08-27 DIAGNOSIS — I4892 Unspecified atrial flutter: Secondary | ICD-10-CM | POA: Diagnosis not present

## 2024-08-29 ENCOUNTER — Ambulatory Visit: Payer: Self-pay | Admitting: Cardiology

## 2024-08-29 LAB — CUP PACEART REMOTE DEVICE CHECK
Date Time Interrogation Session: 20251218121100
Implantable Pulse Generator Implant Date: 20241206
Pulse Gen Serial Number: 131450

## 2024-08-29 NOTE — Progress Notes (Signed)
 Remote Loop Recorder Transmission

## 2024-09-15 NOTE — Progress Notes (Signed)
 "  Cardiology Office Note    Date:  09/16/2024   ID:  DEVANSH Fletcher, DOB April 29, 1961, MRN [id number]  PCP:  Dineen Rollene MATSU, [name]  Cardiologist:  Deatrice Cage, MD  Electrophysiologist:  Fonda Kitty, MD   Chief Complaint: Follow up  History of Present Illness:   Dustin Fletcher is a 64 y.o. male with history of nonobstructive CAD noted on coronary CTA, atrial flutter s/p ablation 08/2023, paroxysmal atrial fibrillation, type 2 diabetes, hypertension, and hyperlipidemia who presents for follow up on CAD and afib.    Patient was seen 03/2022 and noted to be in atrial flutter with RVR.  He was transitioned from lisinopril  to metoprolol  and subsequently noted to be in sinus rhythm.  He was started on anticoagulation.  Echo showed normal LV systolic function with no significant valvular abnormalities.  He was seen 02/2023 with worsening palpitations and chest pain.  Coronary CTA showed calcium  score 410 with mild nonobstructive CAD.  Outpatient cardiac monitor showed intermittent atrial flutter.  He underwent atrial flutter ablation by Dr. Cindie on 08/2023.  In addition, he had a loop recorder placed to assess for atrial arrhythmia.  He was seen by EP 09/2023 noting episodes of tachycardic rates and feeling of hot during these episodes.  He continued to note brief episodes of palpitations.  He was interested in stopping metoprolol  and apixaban .  There was also concern for sleep disordered breathing, snoring, and daytime somnolence.  Interrogation of loop recorder at that time showed frequent, brief episodes of atrial flutter with an overall burden of 1%, lasting up to 1 hour 22 minutes.  It was recommended he continue metoprolol  and apixaban .  He was not interested in pursuing sleep study.  Patient presented to [facility] ED 10/19/2023 after 2 syncopal episodes.  Loop recorder showed a 6.1-second pause at 530 4 in the morning with escape beat and sinus bradycardia in the 20s bpm.  Overall atrial  fibrillation burden of 3%.  Metoprolol  was held with with the last dose being 2/7 AM.  Echo showed EF 60 to 65% with no RWMA.  No arrhythmia, pauses, or high-grade heart block noted on telemetry.  He was evaluated by EP on 2/10 who suspected that syncopal episode was secondary to dehydration, influenza infection, and vagal effect.  It was recommended that he continue to hold metoprolol  until completely recovered from acute illness.  Patient felt back to baseline and was discharged in stable condition. Patient was seen in follow-up with PCP on 10/31/2023 and restarted on metoprolol .  He was feeling fully recovered from his flu with atrial fibrillation burden improved.    Patient was most recently seen by myself 02/28/2024 overall doing well from a cardiac perspective without symptoms of palpitations, lightheadedness, or dizziness concerning for recurrence of atrial fibrillation.  Repeat lipid panel was obtained revealing LDL of 51, at goal.  No further testing or medication changes were indicated at that time.  Patient presents today overall doing well from a cardiac perspective.  He continues to stay active at work without exertional dyspnea or chest pain.  His loop recorder has detected a handful of episodes of atrial fibrillation,.  He is asymptomatic to the episodes, denying shortness of breath, lightheadedness, and palpitations.  No orthopnea or lower extremity swelling.  He is compliant with Eliquis  without bleeding issues.  Labs independently reviewed: 07/2024-Hgb 15.1, HCT 43.6, platelets 223 03/2024-BUN 19, creatinine 0.85, sodium 140, potassium 4.1, normal LFTs, TC 115, TG 150, HDL 38, LDL 51  Objective  Past Medical History:  Diagnosis Date   Aortic valve sclerosis    a. 10/2023 Echo: EF 60-65%, no rwma, mod LVH, nl RV fxn, Ao sclerosis w/o stenosis.   Atrial flutter (HCC)    a. 03/2023 Zio: Predominantly RSR, avg 86 (31-214).  1st deg AVB and Mobitz 1 present.  Less than 1% atrial flutter  burden (average 147) with longest atrial flutter episode 71m and 3s.  SVT and atrial flutter associate with triggered events; b. 08/2023 s/p Aflutter ablation and ILR; c. CHA2DS2VASc = 3-->eliquis .   Bell's palsy    Coronary artery disease, non-occlusive    a. 03/2023 Cor CTA: Ca2+ = 410 (87th%'ile). LAD <25, RCA 25-49p.   Diabetes mellitus without complication (HCC)    Hyperlipidemia    Hypertension    PAF (paroxysmal atrial fibrillation) (HCC)    PSVT (paroxysmal supraventricular tachycardia)    a. 03/2023 Zio: 82 SVT runs, fastest 6 beats at 200, longest 25m and 34s at 114.   Syncope    a. in setting of flu and 6 sec pause on ILR - felt to be vagally mediated.    Current Medications: Active Medications[1]  Allergies:   Patient has no known allergies.   Social History   Socioeconomic History   Marital status: Married    Spouse name: Not on file   Number of children: Not on file   Years of education: Not on file   Highest education level: 12th grade  Occupational History   Not on file  Tobacco Use   Smoking status: Former    Current packs/day: 0.00    Types: Cigarettes    Quit date: 05/31/1981    Years since quitting: 43.3   Smokeless tobacco: Never  Vaping Use   Vaping status: Never Used  Substance and Sexual Activity   Alcohol use: No    Alcohol/week: 0.0 standard drinks of alcohol   Drug use: No   Sexual activity: Not on file  Other Topics Concern   Not on file  Social History Narrative   [name] - [place]      Social Drivers of Health   Tobacco Use: Medium Risk (09/16/2024)   Patient History    Smoking Tobacco Use: Former    Smokeless Tobacco Use: Never    Passive Exposure: Not on file  Financial Resource Strain: Patient Declined (07/20/2024)   Overall Financial Resource Strain (CARDIA)    Difficulty of Paying Living Expenses: Patient declined  Food Insecurity: Patient Declined (07/20/2024)   Epic    Worried About Programme Researcher, Broadcasting/film/video in the Last Year:  Patient declined    Barista in the Last Year: Patient declined  Transportation Needs: Patient Declined (07/20/2024)   Epic    Lack of Transportation (Medical): Patient declined    Lack of Transportation (Non-Medical): Patient declined  Physical Activity: Insufficiently Active (07/20/2024)   Exercise Vital Sign    Days of Exercise per Week: 2 days    Minutes of Exercise per Session: 20 min  Stress: No Stress Concern Present (07/20/2024)   [employer] of Occupational Health - Occupational Stress Questionnaire    Feeling of Stress: Not at all  Social Connections: Unknown (07/20/2024)   Social Connection and Isolation Panel    Frequency of Communication with Friends and Family: Patient declined    Frequency of Social Gatherings with Friends and Family: Patient declined    Attends Religious Services: Patient declined    Active Member of Clubs or Organizations: Patient declined  Attends Banker Meetings: Not on file    Marital Status: Married  Depression [phone]): Low Risk (07/24/2024)   Depression (PHQ2-9)    PHQ-2 Score: 0  Alcohol Screen: Not on file  Housing: Patient Declined (07/20/2024)   Epic    Unable to Pay for Housing in the Last Year: Patient declined    Number of Times Moved in the Last Year: Not on file    Homeless in the Last Year: Patient declined  Utilities: Not At Risk (10/24/2023)   AHC Utilities    Threatened with loss of utilities: No  Health Literacy: Not on file     Family History:  The patient's family history includes Cancer in his father and mother; Diabetes in his father; Heart disease in his brother; Hypertension in his brother and father.  ROS:   12-point review of systems is negative unless otherwise noted in the HPI.  EKGs/Other Studies Reviewed:    Studies reviewed were summarized above. The additional studies were reviewed today:  10/20/2023 Echo complete 1. Left ventricular ejection fraction, by estimation, is 60 to 65%.  The  left ventricle has normal function. The left ventricle has no regional  wall motion abnormalities. There is moderate left ventricular hypertrophy  of the basal-septal segment. Left  ventricular diastolic parameters were normal.   2. Right ventricular systolic function is normal. The right ventricular  size is normal.   3. The mitral valve is normal in structure. No evidence of mitral valve  regurgitation. No evidence of mitral stenosis.   4. The aortic valve is tricuspid. Aortic valve regurgitation is not  visualized. Aortic valve sclerosis/calcification is present, without any  evidence of aortic stenosis. Aortic valve area, by VTI measures 3.44 cm.  Aortic valve mean gradient measures  2.0 mmHg. Aortic valve Vmax measures 0.97 m/s.    03/26/2023 cCTA 1. Coronary calcium  score of 410. This was 87th percentile for age and sex matched control. 2. Normal coronary origin with right dominance. 3. Mild proximal RCA stenosis (25-49%). 4. Minimal proximal LAD stenosis (<25%). 5. CAD-RADS 2. Mild non-obstructive CAD (25-49%). Consider non-atherosclerotic causes of chest pain. Consider preventive therapy and risk factor modification.  EKG:  EKG personally reviewed by me today EKG Interpretation Date/Time:  Tuesday September 16 2024 13:26:09 EST Ventricular Rate:  93 PR Interval:  190 QRS Duration:  118 QT Interval:  382 QTC Calculation: 474 R Axis:   -2  Text Interpretation: Normal sinus rhythm Non-specific intra-ventricular conduction delay Confirmed by Lorene Sinclair ([id number]) on 09/16/2024 1:29:21 PM  PHYSICAL EXAM:    VS:  BP 118/68 (BP Location: Left Arm, Patient Position: Sitting, Cuff Size: Large)   Pulse 93   Ht 6' 2 (1.88 m)   Wt 276 lb 6.4 oz (125.4 kg)   SpO2 96%   BMI 35.49 kg/m   BMI: Body mass index is 35.49 kg/m.  GEN: Well nourished, well developed in no acute distress NECK: No JVD; No carotid bruits CARDIAC: RRR, no murmurs, rubs, gallops RESPIRATORY:  Clear  to auscultation without rales, wheezing or rhonchi  ABDOMEN: Soft, non-tender, non-distended EXTREMITIES: No edema; No deformity  Wt Readings from Last 3 Encounters:  09/16/24 276 lb 6.4 oz (125.4 kg)  07/24/24 271 lb (122.9 kg)  06/18/24 263 lb 9.6 oz (119.6 kg)           STOP-Bang Score:  5         ASSESSMENT & PLAN:   Coronary artery disease Hyperlipidemia - Coronary CTA 03/2023  with mild nonobstructive CAD.  No symptoms of angina or cardiac decompensation.  Most recent lipid panel 03/2024 with LDL of 51, at goal.  He is continued on rosuvastatin  10 mg daily and Eliquis  in place of aspirin.  Paroxysmal atrial fibrillation/flutter - S/p flutter ablation 08/2023.  Has ILR to monitor A-fib burden which is overall very low.  He is asymptomatic to his episodes of atrial fibrillation.  He is continued on Eliquis  5 mg twice daily for CHA2DS2-VASc 3 and metoprolol  to tartrate 25 mg twice daily.  Follows with EP.  Essential hypertension - Blood pressure is well-controlled on metoprolol .   Disposition: F/u with Dr. Darron or an APP in 1 year.   Medication Adjustments/Labs and Tests Ordered: Current medicines are reviewed at length with the patient today.  Concerns regarding medicines are outlined above. Medication changes, Labs and Tests ordered today are summarized above and listed in the Patient Instructions accessible in Encounters.   Bonney Lesley Maffucci, PA-C 09/16/2024 1:42 PM     [facility] [place] [address] [place], [place] [address] [phone]      [1]  Current Meds  Medication Sig   blood glucose meter kit and supplies KIT Dispense based on patient and insurance preference. Use up to four times daily as directed. (FOR ICD-9 250.00, 250.01).   Blood Glucose Monitoring Suppl DEVI 1 each by Does not apply route as directed. Dispense based on patient and insurance preference. Use up to four times daily as directed. (FOR ICD-10 E10.9,  E11.9).   cholecalciferol  (VITAMIN D3) 25 MCG (1000 UNIT) tablet Take 1,000 Units by mouth 2 (two) times daily.   ELDERBERRY PO Take 1 tablet by mouth 2 (two) times daily.   ELIQUIS  5 MG TABS tablet TAKE 1 TABLET BY MOUTH 2 TIMES A DAY   Glucose Blood (BLOOD GLUCOSE TEST STRIPS) STRP 1 each by Does not apply route as directed. Dispense based on patient and insurance preference. Use up to four times daily as directed. (FOR ICD-10 E10.9, E11.9).   Lancet Device MISC 1 each by Does not apply route as directed. Dispense based on patient and insurance preference. Use up to four times daily as directed. (FOR ICD-10 E10.9, E11.9).   Lancets MISC 1 each by Does not apply route as directed. Dispense based on patient and insurance preference. Use up to four times daily as directed. (FOR ICD-10 E10.9, E11.9).   metoprolol  tartrate (LOPRESSOR ) 25 MG tablet Take 1 tablet (25 mg total) by mouth 2 (two) times daily.   Omega-3 Fatty Acids (FISH OIL) 1000 MG CAPS Take 1,000-2,000 mg by mouth See admin instructions. Take 2000 mg in morning and 1000 mg at bedtime   rosuvastatin  (CRESTOR ) 10 MG tablet TAKE 1 TABLET BY MOUTH DAILY   Semaglutide  ([name] ) 14 MG TABS Take 1 tablet (14 mg total) by mouth daily.   vitamin B-12 (CYANOCOBALAMIN ) 1000 MCG tablet Take 1,000 mcg by mouth daily.   XIGDUO  XR 01-999 MG TB24 TAKE 2 TABLETS BY MOUTH EVERY MORNING   "

## 2024-09-16 ENCOUNTER — Ambulatory Visit: Admitting: Cardiology

## 2024-09-16 ENCOUNTER — Encounter: Payer: Self-pay | Admitting: Cardiology

## 2024-09-16 ENCOUNTER — Encounter: Payer: Self-pay | Admitting: Physician Assistant

## 2024-09-16 ENCOUNTER — Ambulatory Visit: Attending: Cardiology | Admitting: Cardiology

## 2024-09-16 ENCOUNTER — Ambulatory Visit: Admitting: Physician Assistant

## 2024-09-16 VITALS — BP 118/68 | HR 93 | Ht 74.0 in | Wt 276.4 lb

## 2024-09-16 VITALS — BP 122/68 | HR 85 | Ht 74.0 in | Wt 276.4 lb

## 2024-09-16 DIAGNOSIS — D6869 Other thrombophilia: Secondary | ICD-10-CM

## 2024-09-16 DIAGNOSIS — E785 Hyperlipidemia, unspecified: Secondary | ICD-10-CM | POA: Diagnosis not present

## 2024-09-16 DIAGNOSIS — I480 Paroxysmal atrial fibrillation: Secondary | ICD-10-CM | POA: Diagnosis not present

## 2024-09-16 DIAGNOSIS — I4892 Unspecified atrial flutter: Secondary | ICD-10-CM

## 2024-09-16 DIAGNOSIS — G4730 Sleep apnea, unspecified: Secondary | ICD-10-CM | POA: Diagnosis not present

## 2024-09-16 DIAGNOSIS — I10 Essential (primary) hypertension: Secondary | ICD-10-CM

## 2024-09-16 DIAGNOSIS — I2510 Atherosclerotic heart disease of native coronary artery without angina pectoris: Secondary | ICD-10-CM

## 2024-09-16 DIAGNOSIS — Z95818 Presence of other cardiac implants and grafts: Secondary | ICD-10-CM

## 2024-09-16 NOTE — Patient Instructions (Signed)
 Medication Instructions:  Your physician recommends that you continue on your current medications as directed. Please refer to the Current Medication list given to you today.  *If you need a refill on your cardiac medications before your next appointment, please call your pharmacy*  Lab Work: No labs ordered today    Testing/Procedures: No test ordered today   Follow-Up: At [facility], you and your health needs are our priority.  As part of our continuing mission to provide you with exceptional heart care, our providers are all part of one team.  This team includes your primary Cardiologist (physician) and Advanced Practice Providers or APPs (Physician Assistants and Nurse Practitioners) who all work together to provide you with the care you need, when you need it.  Your next appointment:   3 to 4 month(s)  Provider:   Fonda Kitty, MD

## 2024-09-16 NOTE — Patient Instructions (Signed)
 Medication Instructions:  Your physician recommends that you continue on your current medications as directed. Please refer to the Current Medication list given to you today.  *If you need a refill on your cardiac medications before your next appointment, please call your pharmacy*  Lab Work: No labs ordered today  If you have labs (blood work) drawn today and your tests are completely normal, you will receive your results only by: MyChart Message (if you have MyChart) OR A paper copy in the mail If you have any lab test that is abnormal or we need to change your treatment, we will call you to review the results.  Testing/Procedures: No test ordered today   Follow-Up: At [facility], you and your health needs are our priority.  As part of our continuing mission to provide you with exceptional heart care, our providers are all part of one team.  This team includes your primary Cardiologist (physician) and Advanced Practice Providers or APPs (Physician Assistants and Nurse Practitioners) who all work together to provide you with the care you need, when you need it.  Your next appointment:   1 year(s)  Provider:   You may see Lorine Bears, MD or one of the following Advanced Practice Providers on your designated Care Team:   Nicolasa Ducking, NP Ames Dura, PA-C Eula Listen, PA-C Cadence [place], PA-C Charlsie Quest, NP Carlos Levering, NP

## 2024-09-16 NOTE — Progress Notes (Signed)
 "     Electrophysiology Clinic Note    Date:  09/16/2024  Patient ID:  Dustin Fletcher, Dustin Fletcher Jan 19, 1961, MRN [id number] PCP:  Dineen Rollene MATSU, [name]  Cardiologist:  Deatrice Cage, MD  Cardiology APP:  Lorene Lesley CROME, PA-C  Electrophysiologist:  Fonda Kitty, MD  Electrophysiology APP:  Palma Buster, NP     Discussed the use of AI scribe software for clinical note transcription with the patient, who gave verbal consent to proceed.   Patient Profile    Chief Complaint: AFib, flutter follow-up  History of Present Illness: Dustin Fletcher is a 64 y.o. male with PMH notable for parox AFib, aflutter, non-obs CAD, T2DM, HTN ; seen today for Fonda Kitty, MD (Previously Dr. Cindie) for routine electrophysiology followup.   He is s/p aflutter ablation w CTI on 08/17/2023. He had an ILR implanted at the same time for arrhythmia monitoring. Subsequently, he was diagnosed with influenza and had episode of syncope x 2 with a 6 second pause detected on ILR. EP evaluated while in hospital and thought both episodes vagally mediated. He was then subsequently diagnosed with covid-19. I last saw him 06/2024 where he was doing well, ILR without AFib. Discussed continuing eliquis  vs transitioning to aspirin and he favored continuing eliquis .   On follow-up today, he is not aware of any A-fib episodes since our last visit.  He previously noticed butterflies in his chest but not does not felt this sensation.  He continues to take Eliquis  twice daily without bleeding concerns.  He does note poor sleep quality, states he has to wake up multiple times in the night to urinate, questions whether this is because of his [name] .  He also has to sleep on his back because of shoulder pain/injuries.  He denies chest pain, chest pressure, shortness of breath, dizziness or presyncope.  Arrhythmia/Device History Bos Sci ILR, imp 08/2023; dx Afib monitoring    ROS:  Please see the history of present  illness. All other systems are reviewed and otherwise negative.    Physical Exam    VS:  BP 122/68 (BP Location: Left Arm, Patient Position: Sitting, Cuff Size: Normal)   Pulse 85   Ht 6' 2 (1.88 m)   Wt 276 lb 6.4 oz (125.4 kg)   SpO2 97%   BMI 35.49 kg/m  BMI: Body mass index is 35.49 kg/m.    Wt Readings from Last 3 Encounters:  09/16/24 276 lb 6.4 oz (125.4 kg)  09/16/24 276 lb 6.4 oz (125.4 kg)  07/24/24 271 lb (122.9 kg)     GEN- The patient is well appearing, alert and oriented x 3 today.   Lungs- Clear to ausculation bilaterally, normal work of breathing.  Heart- Regular rate and rhythm, no murmurs, rubs or gallops Extremities- No peripheral edema, warm, dry    Remote ILR reviewed by myself:  Several brief AFib episodes recently, vast majority in overnight hours AF burden 1%, longest 2.5 hours     Studies Reviewed   Previous EP, cardiology notes.    EKG is ordered. Personal review of EKG from today shows: SR at 93        TTE, 10/20/2023  1. Left ventricular ejection fraction, by estimation, is 60 to 65%. The left ventricle has normal function. The left ventricle has no regional wall motion abnormalities. There is moderate left ventricular hypertrophy of the basal-septal segment. Left ventricular diastolic parameters were normal.   2. Right ventricular systolic function is normal. The right ventricular size  is normal.   3. The mitral valve is normal in structure. No evidence of mitral valve regurgitation. No evidence of mitral stenosis.   4. The aortic valve is tricuspid. Aortic valve regurgitation is not visualized. Aortic valve sclerosis/calcification is present, without any evidence of aortic stenosis. Aortic valve area, by VTI measures 3.44 cm. Aortic valve mean gradient measures 2.0 mmHg. Aortic valve Vmax measures 0.97 m/s.    Coronary CTA, 03/26/2023 1. Coronary calcium  score of 410. This was 87th percentile for age and sex matched control.   2. Normal  coronary origin with right dominance.  3. Mild proximal RCA stenosis (25-49%).  4. Minimal proximal LAD stenosis (<25%).  5. CAD-RADS 2. Mild non-obstructive CAD (25-49%). Consider non-atherosclerotic causes of chest pain. Consider preventive therapy and risk factor modification.   Long term monitor, 02/22/2023 Patient had a min HR of 31 bpm, max HR of 214 bpm, and avg HR of 86 bpm. Predominant underlying rhythm was Sinus Rhythm. First Degree AV Block was present. 2 Ventricular Tachycardia runs occurred, the run with the fastest interval lasting 10 beats with a  max rate of 214 bpm (avg 179 bpm); the run with the fastest interval was also the longest. 82 Supraventricular Tachycardia runs occurred, the run with the fastest interval lasting 6 beats with a max rate of 200 bpm, the longest lasting 23 mins 34 secs with an avg rate of 114 bpm. Atrial Flutter occurred (<1% burden), ranging from 86-168 bpm (avg of 147 bpm), the longest lasting 48 mins 3 secs with an avg rate of 147 bpm. Second Degree AV Block-Mobitz I (Wenckebach) was present. Supraventricular Tachycardia and Atrial Flutter were detected within +/- 45 seconds of symptomatic patient event(s).  Rare PACs and rare PVCs.   Assessment and Plan     #) aflutter  parox afib #) ILR in situ S/p Aflutter ablation 08/2023  ILR shows brief paroxysms of AFib, longest 2.5 hours Continue 25mg  lopressor  BID We discussed escalating AF treatment, he prefers to continue to monitor at this time via ILR  #) Hypercoag d/t parox afib CHA2DS2-VASc Score = at least 3 [CHF History: 0, HTN History: 1, Diabetes History: 1, Stroke History: 0, Vascular Disease History: 1, Age Score: 0, Gender Score: 0].  Therefore, the patient's annual risk of stroke is 3.2 %.    Stroke ppx - 5mg  eliquis  BID, appropriately dosed No bleeding concerns  #) sleep disordered breathing Stop-Bang score = 6 Discussed strong correlation between untreated sleep apnea and atrial  fibrillation Offered referral to pulmonary, patient does not wish to be evaluated for OSA at this time     Current medicines are reviewed at length with the patient today.   The patient does not have concerns regarding his medicines.  The following changes were made today:  none  Labs/ tests ordered today include:  No orders of the defined types were placed in this encounter.    Disposition: Follow up with Dr. Kennyth  in 3 months, Continue monthly ILR monitoring    Signed, Crosley Stejskal, NP  09/16/2024  3:23 PM  Electrophysiology [facility] "

## 2024-09-18 ENCOUNTER — Ambulatory Visit (INDEPENDENT_AMBULATORY_CARE_PROVIDER_SITE_OTHER): Payer: Self-pay | Admitting: Adult Health

## 2024-09-18 DIAGNOSIS — T169XXA Foreign body in ear, unspecified ear, initial encounter: Secondary | ICD-10-CM

## 2024-09-18 NOTE — Progress Notes (Signed)
 Pt not seen by provider.  Had a piece of Ear bud stuck just inside his ear.  CMA removed it, as it was visible at opening. Pt thankful, and left without being seen by provider.

## 2024-09-27 ENCOUNTER — Ambulatory Visit: Attending: Cardiology

## 2024-09-27 DIAGNOSIS — I2510 Atherosclerotic heart disease of native coronary artery without angina pectoris: Secondary | ICD-10-CM | POA: Diagnosis not present

## 2024-09-29 LAB — CUP PACEART REMOTE DEVICE CHECK
Date Time Interrogation Session: 20260119073900
Implantable Pulse Generator Implant Date: 20241206
Pulse Gen Serial Number: 131450

## 2024-10-02 ENCOUNTER — Ambulatory Visit: Payer: Self-pay | Admitting: Cardiology

## 2024-10-02 NOTE — Progress Notes (Signed)
 Remote Loop Recorder Transmission

## 2024-10-10 ENCOUNTER — Telehealth: Payer: Self-pay | Admitting: Emergency Medicine

## 2024-10-10 NOTE — Telephone Encounter (Signed)
-----   Message from Nurse Leita NOVAK, RN sent at 09/16/2024  2:23 PM EST ----- Regarding: Appointment with Dr. Kennyth Richard afternoon! Suzann is requesting that Mr. Letourneau follow up with Dr. Kennyth for intermittent afib please. Goal is in the next 3-4 months. Please let me know if there is anything I can do to help. Thank you.

## 2024-10-10 NOTE — Telephone Encounter (Signed)
 Call x2; Francie,, RN left call x1) lvmtcb to schedule appt w/ JP in [place] for intermitten af per Suzann, appt made on 4/28 at 220 - mychart message sent to patient.

## 2024-10-15 ENCOUNTER — Other Ambulatory Visit

## 2024-10-15 DIAGNOSIS — E118 Type 2 diabetes mellitus with unspecified complications: Secondary | ICD-10-CM

## 2024-10-16 LAB — COMPREHENSIVE METABOLIC PANEL WITH GFR
ALT: 26 [IU]/L (ref 0–44)
AST: 28 [IU]/L (ref 0–40)
Albumin: 4.5 g/dL (ref 3.9–4.9)
Alkaline Phosphatase: 89 [IU]/L (ref 47–123)
BUN/Creatinine Ratio: 19 (ref 10–24)
BUN: 18 mg/dL (ref 8–27)
Bilirubin Total: 0.3 mg/dL (ref 0.0–1.2)
CO2: 20 mmol/L (ref 20–29)
Calcium: 9.7 mg/dL (ref 8.6–10.2)
Chloride: 104 mmol/L (ref 96–106)
Creatinine, Ser: 0.93 mg/dL (ref 0.76–1.27)
Globulin, Total: 2.3 g/dL (ref 1.5–4.5)
Glucose: 248 mg/dL — ABNORMAL HIGH (ref 70–99)
Potassium: 4.1 mmol/L (ref 3.5–5.2)
Sodium: 142 mmol/L (ref 134–144)
Total Protein: 6.8 g/dL (ref 6.0–8.5)
eGFR: 92 mL/min/{1.73_m2}

## 2024-10-16 LAB — HEMOGLOBIN A1C
Est. average glucose Bld gHb Est-mCnc: 186 mg/dL
Hgb A1c MFr Bld: 8.1 % — ABNORMAL HIGH (ref 4.8–5.6)

## 2024-10-17 ENCOUNTER — Telehealth: Payer: Self-pay

## 2024-10-17 NOTE — Telephone Encounter (Signed)
 Patient called back and said he was awake and at work during the time of the event but denied experiencing any symptoms  Patient advised to call device clinic (number provided) if he ever becomes symptomatic and he verbalized understanding

## 2024-10-17 NOTE — Telephone Encounter (Signed)
 ILR: implanted for Aflutter Alert remote transmission: Pause 1  pause 2/5 @ 21:42, duration 3.2sec, EGM c/w non-conducted P waves   LM to return call to assess if patient was asleep v/s symptomatic.

## 2024-10-23 ENCOUNTER — Ambulatory Visit: Admitting: Family

## 2024-10-28 ENCOUNTER — Ambulatory Visit

## 2024-11-28 ENCOUNTER — Ambulatory Visit

## 2024-12-29 ENCOUNTER — Ambulatory Visit

## 2025-01-06 ENCOUNTER — Ambulatory Visit: Admitting: Cardiology

## 2025-01-29 ENCOUNTER — Ambulatory Visit

## 2025-03-01 ENCOUNTER — Ambulatory Visit

## 2025-04-01 ENCOUNTER — Ambulatory Visit

## 2025-05-02 ENCOUNTER — Ambulatory Visit

## 2025-06-02 ENCOUNTER — Ambulatory Visit

## 2025-07-03 ENCOUNTER — Ambulatory Visit

## 2025-08-03 ENCOUNTER — Ambulatory Visit

## 2025-09-05 ENCOUNTER — Ambulatory Visit
